# Patient Record
Sex: Female | Born: 1970
Health system: Southern US, Community
[De-identification: ages and names within clinical notes are randomized; demographics above are authoritative.]

## PROBLEM LIST (undated history)

## (undated) DIAGNOSIS — R0600 Dyspnea, unspecified: Secondary | ICD-10-CM

## (undated) DIAGNOSIS — M1990 Unspecified osteoarthritis, unspecified site: Secondary | ICD-10-CM

## (undated) DIAGNOSIS — K219 Gastro-esophageal reflux disease without esophagitis: Secondary | ICD-10-CM

## (undated) DIAGNOSIS — M792 Neuralgia and neuritis, unspecified: Secondary | ICD-10-CM

## (undated) DIAGNOSIS — F4310 Post-traumatic stress disorder, unspecified: Secondary | ICD-10-CM

## (undated) DIAGNOSIS — M4807 Spinal stenosis, lumbosacral region: Secondary | ICD-10-CM

## (undated) DIAGNOSIS — M87052 Idiopathic aseptic necrosis of left femur: Secondary | ICD-10-CM

## (undated) DIAGNOSIS — G43909 Migraine, unspecified, not intractable, without status migrainosus: Secondary | ICD-10-CM

## (undated) DIAGNOSIS — K759 Inflammatory liver disease, unspecified: Secondary | ICD-10-CM

## (undated) DIAGNOSIS — T1490XA Injury, unspecified, initial encounter: Secondary | ICD-10-CM

## (undated) DIAGNOSIS — N951 Menopausal and female climacteric states: Secondary | ICD-10-CM

## (undated) DIAGNOSIS — F32A Depression, unspecified: Secondary | ICD-10-CM

## (undated) DIAGNOSIS — D649 Anemia, unspecified: Secondary | ICD-10-CM

## (undated) DIAGNOSIS — Z21 Asymptomatic human immunodeficiency virus [HIV] infection status: Secondary | ICD-10-CM

## (undated) DIAGNOSIS — G4700 Insomnia, unspecified: Secondary | ICD-10-CM

## (undated) DIAGNOSIS — Z87442 Personal history of urinary calculi: Secondary | ICD-10-CM

## (undated) DIAGNOSIS — M5136 Other intervertebral disc degeneration, lumbar region: Secondary | ICD-10-CM

## (undated) DIAGNOSIS — M779 Enthesopathy, unspecified: Secondary | ICD-10-CM

## (undated) DIAGNOSIS — M51369 Other intervertebral disc degeneration, lumbar region without mention of lumbar back pain or lower extremity pain: Secondary | ICD-10-CM

## (undated) DIAGNOSIS — F419 Anxiety disorder, unspecified: Secondary | ICD-10-CM

## (undated) DIAGNOSIS — M5126 Other intervertebral disc displacement, lumbar region: Secondary | ICD-10-CM

## (undated) DIAGNOSIS — B20 Human immunodeficiency virus [HIV] disease: Secondary | ICD-10-CM

## (undated) DIAGNOSIS — F329 Major depressive disorder, single episode, unspecified: Secondary | ICD-10-CM

## (undated) HISTORY — DX: Other intervertebral disc degeneration, lumbar region without mention of lumbar back pain or lower extremity pain: M51.369

## (undated) HISTORY — DX: Menopausal and female climacteric states: N95.1

## (undated) HISTORY — PX: TUBAL LIGATION: SHX77

## (undated) HISTORY — DX: Insomnia, unspecified: G47.00

## (undated) HISTORY — DX: Unspecified osteoarthritis, unspecified site: M19.90

## (undated) HISTORY — DX: Migraine, unspecified, not intractable, without status migrainosus: G43.909

## (undated) HISTORY — DX: Human immunodeficiency virus (HIV) disease: B20

## (undated) HISTORY — PX: ABDOMINAL HYSTERECTOMY: SHX81

## (undated) HISTORY — DX: Unspecified porphyria: E80.20

## (undated) HISTORY — DX: Other intervertebral disc displacement, lumbar region: M51.26

## (undated) HISTORY — DX: Idiopathic aseptic necrosis of left femur: M87.052

## (undated) HISTORY — DX: Injury, unspecified, initial encounter: T14.90XA

## (undated) HISTORY — DX: Major depressive disorder, single episode, unspecified: F32.9

## (undated) HISTORY — PX: ESOPHAGOGASTRODUODENOSCOPY: SHX1529

## (undated) HISTORY — DX: Enthesopathy, unspecified: M77.9

## (undated) HISTORY — PX: INCONTINENCE SURGERY: SHX676

## (undated) HISTORY — DX: Post-traumatic stress disorder, unspecified: F43.10

## (undated) HISTORY — DX: Anxiety disorder, unspecified: F41.9

## (undated) HISTORY — DX: Gastro-esophageal reflux disease without esophagitis: K21.9

## (undated) HISTORY — DX: Other intervertebral disc degeneration, lumbar region: M51.36

## (undated) HISTORY — DX: Spinal stenosis, lumbosacral region: M48.07

## (undated) HISTORY — DX: Depression, unspecified: F32.A

## (undated) HISTORY — DX: Asymptomatic human immunodeficiency virus (hiv) infection status: Z21

## (undated) HISTORY — DX: Neuralgia and neuritis, unspecified: M79.2

## (undated) HISTORY — PX: URETHRAL SLING: SHX2621

## (undated) HISTORY — PX: JOINT REPLACEMENT: SHX530

---

## 2001-07-02 ENCOUNTER — Emergency Department (HOSPITAL_COMMUNITY): Admission: EM | Admit: 2001-07-02 | Discharge: 2001-07-03 | Payer: Self-pay | Admitting: Emergency Medicine

## 2003-04-01 ENCOUNTER — Emergency Department (HOSPITAL_COMMUNITY): Admission: EM | Admit: 2003-04-01 | Discharge: 2003-04-01 | Payer: Self-pay

## 2013-02-21 HISTORY — PX: TOTAL HIP ARTHROPLASTY: SHX124

## 2015-07-01 DIAGNOSIS — F32A Depression, unspecified: Secondary | ICD-10-CM | POA: Insufficient documentation

## 2015-09-13 DIAGNOSIS — E800 Hereditary erythropoietic porphyria: Secondary | ICD-10-CM | POA: Insufficient documentation

## 2015-09-13 DIAGNOSIS — M87052 Idiopathic aseptic necrosis of left femur: Secondary | ICD-10-CM | POA: Insufficient documentation

## 2016-01-02 DIAGNOSIS — Z21 Asymptomatic human immunodeficiency virus [HIV] infection status: Secondary | ICD-10-CM | POA: Diagnosis not present

## 2016-01-02 DIAGNOSIS — B1920 Unspecified viral hepatitis C without hepatic coma: Secondary | ICD-10-CM | POA: Diagnosis not present

## 2016-01-02 DIAGNOSIS — M4806 Spinal stenosis, lumbar region: Secondary | ICD-10-CM | POA: Diagnosis not present

## 2016-01-02 DIAGNOSIS — M25551 Pain in right hip: Secondary | ICD-10-CM | POA: Diagnosis not present

## 2016-01-02 DIAGNOSIS — Z882 Allergy status to sulfonamides status: Secondary | ICD-10-CM | POA: Diagnosis not present

## 2016-01-02 DIAGNOSIS — Z96649 Presence of unspecified artificial hip joint: Secondary | ICD-10-CM | POA: Diagnosis not present

## 2016-01-02 DIAGNOSIS — G8929 Other chronic pain: Secondary | ICD-10-CM | POA: Diagnosis not present

## 2016-01-02 DIAGNOSIS — Z79899 Other long term (current) drug therapy: Secondary | ICD-10-CM | POA: Diagnosis not present

## 2016-01-02 DIAGNOSIS — M25552 Pain in left hip: Secondary | ICD-10-CM | POA: Diagnosis not present

## 2016-01-02 DIAGNOSIS — M5416 Radiculopathy, lumbar region: Secondary | ICD-10-CM | POA: Diagnosis not present

## 2016-01-04 DIAGNOSIS — R05 Cough: Secondary | ICD-10-CM | POA: Diagnosis not present

## 2016-01-04 DIAGNOSIS — E669 Obesity, unspecified: Secondary | ICD-10-CM | POA: Diagnosis not present

## 2016-01-04 DIAGNOSIS — Z23 Encounter for immunization: Secondary | ICD-10-CM | POA: Diagnosis not present

## 2016-01-04 DIAGNOSIS — Z21 Asymptomatic human immunodeficiency virus [HIV] infection status: Secondary | ICD-10-CM | POA: Diagnosis not present

## 2016-01-04 DIAGNOSIS — Z7189 Other specified counseling: Secondary | ICD-10-CM | POA: Diagnosis not present

## 2016-01-05 DIAGNOSIS — M25551 Pain in right hip: Secondary | ICD-10-CM | POA: Diagnosis not present

## 2016-01-05 DIAGNOSIS — M545 Low back pain: Secondary | ICD-10-CM | POA: Diagnosis not present

## 2016-01-05 DIAGNOSIS — M5416 Radiculopathy, lumbar region: Secondary | ICD-10-CM | POA: Diagnosis not present

## 2016-01-11 DIAGNOSIS — B20 Human immunodeficiency virus [HIV] disease: Secondary | ICD-10-CM | POA: Diagnosis not present

## 2016-01-11 DIAGNOSIS — M5416 Radiculopathy, lumbar region: Secondary | ICD-10-CM | POA: Diagnosis not present

## 2016-01-11 DIAGNOSIS — M25551 Pain in right hip: Secondary | ICD-10-CM | POA: Diagnosis not present

## 2016-01-11 DIAGNOSIS — M545 Low back pain: Secondary | ICD-10-CM | POA: Diagnosis not present

## 2016-01-13 DIAGNOSIS — S2190XA Unspecified open wound of unspecified part of thorax, initial encounter: Secondary | ICD-10-CM | POA: Diagnosis not present

## 2016-01-13 DIAGNOSIS — R109 Unspecified abdominal pain: Secondary | ICD-10-CM | POA: Diagnosis not present

## 2016-01-13 DIAGNOSIS — Y33XXXA Other specified events, undetermined intent, initial encounter: Secondary | ICD-10-CM | POA: Diagnosis not present

## 2016-01-13 DIAGNOSIS — S301XXA Contusion of abdominal wall, initial encounter: Secondary | ICD-10-CM | POA: Diagnosis not present

## 2016-01-13 DIAGNOSIS — S161XXA Strain of muscle, fascia and tendon at neck level, initial encounter: Secondary | ICD-10-CM | POA: Diagnosis not present

## 2016-01-13 DIAGNOSIS — B182 Chronic viral hepatitis C: Secondary | ICD-10-CM | POA: Diagnosis not present

## 2016-01-13 DIAGNOSIS — R51 Headache: Secondary | ICD-10-CM | POA: Diagnosis not present

## 2016-01-13 DIAGNOSIS — S20219A Contusion of unspecified front wall of thorax, initial encounter: Secondary | ICD-10-CM | POA: Diagnosis not present

## 2016-01-13 DIAGNOSIS — F329 Major depressive disorder, single episode, unspecified: Secondary | ICD-10-CM | POA: Diagnosis not present

## 2016-01-13 DIAGNOSIS — M542 Cervicalgia: Secondary | ICD-10-CM | POA: Diagnosis not present

## 2016-01-13 DIAGNOSIS — K7460 Unspecified cirrhosis of liver: Secondary | ICD-10-CM | POA: Diagnosis not present

## 2016-01-13 DIAGNOSIS — R079 Chest pain, unspecified: Secondary | ICD-10-CM | POA: Diagnosis not present

## 2016-01-13 DIAGNOSIS — Z79899 Other long term (current) drug therapy: Secondary | ICD-10-CM | POA: Diagnosis not present

## 2016-01-13 DIAGNOSIS — K219 Gastro-esophageal reflux disease without esophagitis: Secondary | ICD-10-CM | POA: Diagnosis not present

## 2016-01-13 DIAGNOSIS — F419 Anxiety disorder, unspecified: Secondary | ICD-10-CM | POA: Diagnosis not present

## 2016-01-17 DIAGNOSIS — Z1231 Encounter for screening mammogram for malignant neoplasm of breast: Secondary | ICD-10-CM | POA: Diagnosis not present

## 2016-01-18 DIAGNOSIS — F331 Major depressive disorder, recurrent, moderate: Secondary | ICD-10-CM | POA: Diagnosis not present

## 2016-01-18 DIAGNOSIS — F4310 Post-traumatic stress disorder, unspecified: Secondary | ICD-10-CM | POA: Diagnosis not present

## 2016-01-18 DIAGNOSIS — B20 Human immunodeficiency virus [HIV] disease: Secondary | ICD-10-CM | POA: Diagnosis not present

## 2016-01-18 DIAGNOSIS — E663 Overweight: Secondary | ICD-10-CM | POA: Diagnosis not present

## 2016-01-19 DIAGNOSIS — M25551 Pain in right hip: Secondary | ICD-10-CM | POA: Diagnosis not present

## 2016-01-19 DIAGNOSIS — M545 Low back pain: Secondary | ICD-10-CM | POA: Diagnosis not present

## 2016-01-19 DIAGNOSIS — M5416 Radiculopathy, lumbar region: Secondary | ICD-10-CM | POA: Diagnosis not present

## 2016-01-23 DIAGNOSIS — M5416 Radiculopathy, lumbar region: Secondary | ICD-10-CM | POA: Diagnosis not present

## 2016-01-23 DIAGNOSIS — M545 Low back pain: Secondary | ICD-10-CM | POA: Diagnosis not present

## 2016-01-23 DIAGNOSIS — M25551 Pain in right hip: Secondary | ICD-10-CM | POA: Diagnosis not present

## 2016-01-27 DIAGNOSIS — F3340 Major depressive disorder, recurrent, in remission, unspecified: Secondary | ICD-10-CM | POA: Diagnosis not present

## 2016-01-27 DIAGNOSIS — B1920 Unspecified viral hepatitis C without hepatic coma: Secondary | ICD-10-CM | POA: Diagnosis not present

## 2016-01-27 DIAGNOSIS — B20 Human immunodeficiency virus [HIV] disease: Secondary | ICD-10-CM | POA: Diagnosis not present

## 2016-02-06 DIAGNOSIS — M5416 Radiculopathy, lumbar region: Secondary | ICD-10-CM | POA: Diagnosis not present

## 2016-02-06 DIAGNOSIS — Z21 Asymptomatic human immunodeficiency virus [HIV] infection status: Secondary | ICD-10-CM | POA: Diagnosis not present

## 2016-02-06 DIAGNOSIS — M25551 Pain in right hip: Secondary | ICD-10-CM | POA: Diagnosis not present

## 2016-02-06 DIAGNOSIS — B1920 Unspecified viral hepatitis C without hepatic coma: Secondary | ICD-10-CM | POA: Diagnosis not present

## 2016-02-06 DIAGNOSIS — M4806 Spinal stenosis, lumbar region: Secondary | ICD-10-CM | POA: Diagnosis not present

## 2016-02-06 DIAGNOSIS — G8929 Other chronic pain: Secondary | ICD-10-CM | POA: Diagnosis not present

## 2016-02-16 DIAGNOSIS — B1920 Unspecified viral hepatitis C without hepatic coma: Secondary | ICD-10-CM | POA: Diagnosis not present

## 2016-02-16 DIAGNOSIS — K760 Fatty (change of) liver, not elsewhere classified: Secondary | ICD-10-CM | POA: Diagnosis not present

## 2016-02-17 DIAGNOSIS — G43809 Other migraine, not intractable, without status migrainosus: Secondary | ICD-10-CM | POA: Diagnosis not present

## 2016-02-17 DIAGNOSIS — B1920 Unspecified viral hepatitis C without hepatic coma: Secondary | ICD-10-CM | POA: Diagnosis not present

## 2016-02-17 DIAGNOSIS — B20 Human immunodeficiency virus [HIV] disease: Secondary | ICD-10-CM | POA: Diagnosis not present

## 2016-02-24 DIAGNOSIS — R339 Retention of urine, unspecified: Secondary | ICD-10-CM | POA: Diagnosis not present

## 2016-02-24 DIAGNOSIS — Z21 Asymptomatic human immunodeficiency virus [HIV] infection status: Secondary | ICD-10-CM | POA: Diagnosis not present

## 2016-02-29 DIAGNOSIS — B1920 Unspecified viral hepatitis C without hepatic coma: Secondary | ICD-10-CM | POA: Diagnosis not present

## 2016-03-02 DIAGNOSIS — Z21 Asymptomatic human immunodeficiency virus [HIV] infection status: Secondary | ICD-10-CM | POA: Diagnosis not present

## 2016-03-02 DIAGNOSIS — R635 Abnormal weight gain: Secondary | ICD-10-CM | POA: Diagnosis not present

## 2016-03-02 DIAGNOSIS — Z6833 Body mass index (BMI) 33.0-33.9, adult: Secondary | ICD-10-CM | POA: Diagnosis not present

## 2016-03-02 DIAGNOSIS — J069 Acute upper respiratory infection, unspecified: Secondary | ICD-10-CM | POA: Diagnosis not present

## 2016-03-06 DIAGNOSIS — B20 Human immunodeficiency virus [HIV] disease: Secondary | ICD-10-CM | POA: Diagnosis not present

## 2016-03-06 DIAGNOSIS — K209 Esophagitis, unspecified: Secondary | ICD-10-CM | POA: Diagnosis not present

## 2016-03-06 DIAGNOSIS — B182 Chronic viral hepatitis C: Secondary | ICD-10-CM | POA: Diagnosis not present

## 2016-03-14 DIAGNOSIS — F4310 Post-traumatic stress disorder, unspecified: Secondary | ICD-10-CM | POA: Diagnosis not present

## 2016-03-14 DIAGNOSIS — E663 Overweight: Secondary | ICD-10-CM | POA: Diagnosis not present

## 2016-03-14 DIAGNOSIS — F331 Major depressive disorder, recurrent, moderate: Secondary | ICD-10-CM | POA: Diagnosis not present

## 2016-03-14 DIAGNOSIS — B20 Human immunodeficiency virus [HIV] disease: Secondary | ICD-10-CM | POA: Diagnosis not present

## 2016-03-19 DIAGNOSIS — Z21 Asymptomatic human immunodeficiency virus [HIV] infection status: Secondary | ICD-10-CM | POA: Diagnosis not present

## 2016-03-19 DIAGNOSIS — M4806 Spinal stenosis, lumbar region: Secondary | ICD-10-CM | POA: Diagnosis not present

## 2016-03-19 DIAGNOSIS — M4726 Other spondylosis with radiculopathy, lumbar region: Secondary | ICD-10-CM | POA: Diagnosis not present

## 2016-03-19 DIAGNOSIS — Z79899 Other long term (current) drug therapy: Secondary | ICD-10-CM | POA: Diagnosis not present

## 2016-03-19 DIAGNOSIS — M25551 Pain in right hip: Secondary | ICD-10-CM | POA: Diagnosis not present

## 2016-03-19 DIAGNOSIS — G8929 Other chronic pain: Secondary | ICD-10-CM | POA: Diagnosis not present

## 2016-03-19 DIAGNOSIS — Z96649 Presence of unspecified artificial hip joint: Secondary | ICD-10-CM | POA: Diagnosis not present

## 2016-03-19 DIAGNOSIS — B1920 Unspecified viral hepatitis C without hepatic coma: Secondary | ICD-10-CM | POA: Diagnosis not present

## 2016-04-03 DIAGNOSIS — R350 Frequency of micturition: Secondary | ICD-10-CM | POA: Diagnosis not present

## 2016-04-03 DIAGNOSIS — N393 Stress incontinence (female) (male): Secondary | ICD-10-CM | POA: Diagnosis not present

## 2016-04-05 DIAGNOSIS — Z23 Encounter for immunization: Secondary | ICD-10-CM | POA: Diagnosis not present

## 2016-05-02 DIAGNOSIS — B9689 Other specified bacterial agents as the cause of diseases classified elsewhere: Secondary | ICD-10-CM | POA: Diagnosis not present

## 2016-05-02 DIAGNOSIS — J208 Acute bronchitis due to other specified organisms: Secondary | ICD-10-CM | POA: Diagnosis not present

## 2016-05-02 DIAGNOSIS — D509 Iron deficiency anemia, unspecified: Secondary | ICD-10-CM | POA: Diagnosis not present

## 2016-05-02 DIAGNOSIS — E669 Obesity, unspecified: Secondary | ICD-10-CM | POA: Diagnosis not present

## 2016-05-02 DIAGNOSIS — Z21 Asymptomatic human immunodeficiency virus [HIV] infection status: Secondary | ICD-10-CM | POA: Diagnosis not present

## 2016-05-03 DIAGNOSIS — Z23 Encounter for immunization: Secondary | ICD-10-CM | POA: Diagnosis not present

## 2016-05-15 DIAGNOSIS — N393 Stress incontinence (female) (male): Secondary | ICD-10-CM | POA: Diagnosis not present

## 2016-05-15 DIAGNOSIS — R350 Frequency of micturition: Secondary | ICD-10-CM | POA: Diagnosis not present

## 2016-05-17 DIAGNOSIS — B182 Chronic viral hepatitis C: Secondary | ICD-10-CM | POA: Diagnosis not present

## 2016-05-17 DIAGNOSIS — B20 Human immunodeficiency virus [HIV] disease: Secondary | ICD-10-CM | POA: Diagnosis not present

## 2016-05-22 DIAGNOSIS — N393 Stress incontinence (female) (male): Secondary | ICD-10-CM | POA: Diagnosis not present

## 2016-05-22 DIAGNOSIS — B182 Chronic viral hepatitis C: Secondary | ICD-10-CM | POA: Diagnosis not present

## 2016-05-22 DIAGNOSIS — R350 Frequency of micturition: Secondary | ICD-10-CM | POA: Diagnosis not present

## 2016-05-22 DIAGNOSIS — Z21 Asymptomatic human immunodeficiency virus [HIV] infection status: Secondary | ICD-10-CM | POA: Diagnosis not present

## 2016-05-23 DIAGNOSIS — F331 Major depressive disorder, recurrent, moderate: Secondary | ICD-10-CM | POA: Diagnosis not present

## 2016-05-23 DIAGNOSIS — B20 Human immunodeficiency virus [HIV] disease: Secondary | ICD-10-CM | POA: Diagnosis not present

## 2016-05-23 DIAGNOSIS — F4310 Post-traumatic stress disorder, unspecified: Secondary | ICD-10-CM | POA: Diagnosis not present

## 2016-05-23 DIAGNOSIS — B1920 Unspecified viral hepatitis C without hepatic coma: Secondary | ICD-10-CM | POA: Diagnosis not present

## 2016-05-29 DIAGNOSIS — Z21 Asymptomatic human immunodeficiency virus [HIV] infection status: Secondary | ICD-10-CM | POA: Diagnosis not present

## 2016-05-29 DIAGNOSIS — R05 Cough: Secondary | ICD-10-CM | POA: Diagnosis not present

## 2016-05-29 DIAGNOSIS — Z1159 Encounter for screening for other viral diseases: Secondary | ICD-10-CM | POA: Diagnosis not present

## 2016-05-29 DIAGNOSIS — Z7189 Other specified counseling: Secondary | ICD-10-CM | POA: Diagnosis not present

## 2016-05-31 DIAGNOSIS — Z21 Asymptomatic human immunodeficiency virus [HIV] infection status: Secondary | ICD-10-CM | POA: Diagnosis not present

## 2016-06-11 DIAGNOSIS — B1920 Unspecified viral hepatitis C without hepatic coma: Secondary | ICD-10-CM | POA: Diagnosis not present

## 2016-06-11 DIAGNOSIS — Z21 Asymptomatic human immunodeficiency virus [HIV] infection status: Secondary | ICD-10-CM | POA: Diagnosis not present

## 2016-06-11 DIAGNOSIS — K739 Chronic hepatitis, unspecified: Secondary | ICD-10-CM | POA: Diagnosis not present

## 2016-06-11 DIAGNOSIS — M4806 Spinal stenosis, lumbar region: Secondary | ICD-10-CM | POA: Diagnosis not present

## 2016-06-11 DIAGNOSIS — Z79899 Other long term (current) drug therapy: Secondary | ICD-10-CM | POA: Diagnosis not present

## 2016-06-11 DIAGNOSIS — M25551 Pain in right hip: Secondary | ICD-10-CM | POA: Diagnosis not present

## 2016-06-11 DIAGNOSIS — B20 Human immunodeficiency virus [HIV] disease: Secondary | ICD-10-CM | POA: Diagnosis not present

## 2016-06-11 DIAGNOSIS — G8929 Other chronic pain: Secondary | ICD-10-CM | POA: Diagnosis not present

## 2016-06-11 DIAGNOSIS — M5416 Radiculopathy, lumbar region: Secondary | ICD-10-CM | POA: Diagnosis not present

## 2016-06-11 DIAGNOSIS — M47816 Spondylosis without myelopathy or radiculopathy, lumbar region: Secondary | ICD-10-CM | POA: Diagnosis not present

## 2016-06-21 DIAGNOSIS — G4700 Insomnia, unspecified: Secondary | ICD-10-CM | POA: Diagnosis not present

## 2016-06-21 DIAGNOSIS — E669 Obesity, unspecified: Secondary | ICD-10-CM | POA: Diagnosis not present

## 2016-06-21 DIAGNOSIS — G4733 Obstructive sleep apnea (adult) (pediatric): Secondary | ICD-10-CM | POA: Diagnosis not present

## 2016-06-21 DIAGNOSIS — R05 Cough: Secondary | ICD-10-CM | POA: Diagnosis not present

## 2016-06-21 DIAGNOSIS — J309 Allergic rhinitis, unspecified: Secondary | ICD-10-CM | POA: Diagnosis not present

## 2016-06-22 DIAGNOSIS — N393 Stress incontinence (female) (male): Secondary | ICD-10-CM | POA: Diagnosis not present

## 2016-06-22 DIAGNOSIS — N8110 Cystocele, unspecified: Secondary | ICD-10-CM | POA: Diagnosis not present

## 2016-06-22 DIAGNOSIS — M1990 Unspecified osteoarthritis, unspecified site: Secondary | ICD-10-CM | POA: Diagnosis not present

## 2016-06-22 DIAGNOSIS — D759 Disease of blood and blood-forming organs, unspecified: Secondary | ICD-10-CM | POA: Diagnosis not present

## 2016-06-22 DIAGNOSIS — Z6834 Body mass index (BMI) 34.0-34.9, adult: Secondary | ICD-10-CM | POA: Diagnosis not present

## 2016-06-22 DIAGNOSIS — E669 Obesity, unspecified: Secondary | ICD-10-CM | POA: Diagnosis not present

## 2016-06-22 DIAGNOSIS — K219 Gastro-esophageal reflux disease without esophagitis: Secondary | ICD-10-CM | POA: Diagnosis not present

## 2016-06-22 DIAGNOSIS — Z01812 Encounter for preprocedural laboratory examination: Secondary | ICD-10-CM | POA: Diagnosis not present

## 2016-06-22 DIAGNOSIS — B1920 Unspecified viral hepatitis C without hepatic coma: Secondary | ICD-10-CM | POA: Diagnosis not present

## 2016-06-28 DIAGNOSIS — M545 Low back pain: Secondary | ICD-10-CM | POA: Diagnosis not present

## 2016-07-18 DIAGNOSIS — G4733 Obstructive sleep apnea (adult) (pediatric): Secondary | ICD-10-CM | POA: Diagnosis not present

## 2016-07-18 DIAGNOSIS — G4761 Periodic limb movement disorder: Secondary | ICD-10-CM | POA: Diagnosis not present

## 2016-07-24 DIAGNOSIS — B20 Human immunodeficiency virus [HIV] disease: Secondary | ICD-10-CM | POA: Diagnosis not present

## 2016-07-24 DIAGNOSIS — B182 Chronic viral hepatitis C: Secondary | ICD-10-CM | POA: Diagnosis not present

## 2016-07-25 DIAGNOSIS — K219 Gastro-esophageal reflux disease without esophagitis: Secondary | ICD-10-CM | POA: Diagnosis not present

## 2016-07-25 DIAGNOSIS — K7460 Unspecified cirrhosis of liver: Secondary | ICD-10-CM | POA: Diagnosis not present

## 2016-07-25 DIAGNOSIS — D649 Anemia, unspecified: Secondary | ICD-10-CM | POA: Diagnosis not present

## 2016-07-25 DIAGNOSIS — Z6833 Body mass index (BMI) 33.0-33.9, adult: Secondary | ICD-10-CM | POA: Diagnosis not present

## 2016-07-25 DIAGNOSIS — B20 Human immunodeficiency virus [HIV] disease: Secondary | ICD-10-CM | POA: Diagnosis not present

## 2016-07-25 DIAGNOSIS — E800 Hereditary erythropoietic porphyria: Secondary | ICD-10-CM | POA: Diagnosis not present

## 2016-07-25 DIAGNOSIS — N8112 Cystocele, lateral: Secondary | ICD-10-CM | POA: Insufficient documentation

## 2016-07-25 DIAGNOSIS — Z886 Allergy status to analgesic agent status: Secondary | ICD-10-CM | POA: Diagnosis not present

## 2016-07-25 DIAGNOSIS — N393 Stress incontinence (female) (male): Secondary | ICD-10-CM | POA: Diagnosis not present

## 2016-07-25 DIAGNOSIS — Z882 Allergy status to sulfonamides status: Secondary | ICD-10-CM | POA: Diagnosis not present

## 2016-07-25 DIAGNOSIS — Z79899 Other long term (current) drug therapy: Secondary | ICD-10-CM | POA: Diagnosis not present

## 2016-07-25 DIAGNOSIS — Z888 Allergy status to other drugs, medicaments and biological substances status: Secondary | ICD-10-CM | POA: Diagnosis not present

## 2016-07-25 DIAGNOSIS — N8110 Cystocele, unspecified: Secondary | ICD-10-CM | POA: Diagnosis not present

## 2016-07-25 DIAGNOSIS — Z79810 Long term (current) use of selective estrogen receptor modulators (SERMs): Secondary | ICD-10-CM | POA: Diagnosis not present

## 2016-07-25 DIAGNOSIS — B182 Chronic viral hepatitis C: Secondary | ICD-10-CM | POA: Diagnosis not present

## 2016-07-25 DIAGNOSIS — E669 Obesity, unspecified: Secondary | ICD-10-CM | POA: Diagnosis not present

## 2016-08-08 DIAGNOSIS — Z9889 Other specified postprocedural states: Secondary | ICD-10-CM | POA: Diagnosis not present

## 2016-09-10 DIAGNOSIS — B1920 Unspecified viral hepatitis C without hepatic coma: Secondary | ICD-10-CM | POA: Diagnosis not present

## 2016-09-10 DIAGNOSIS — M25552 Pain in left hip: Secondary | ICD-10-CM | POA: Diagnosis not present

## 2016-09-10 DIAGNOSIS — M4726 Other spondylosis with radiculopathy, lumbar region: Secondary | ICD-10-CM | POA: Diagnosis not present

## 2016-09-10 DIAGNOSIS — M47816 Spondylosis without myelopathy or radiculopathy, lumbar region: Secondary | ICD-10-CM | POA: Diagnosis not present

## 2016-09-10 DIAGNOSIS — M4806 Spinal stenosis, lumbar region: Secondary | ICD-10-CM | POA: Diagnosis not present

## 2016-09-10 DIAGNOSIS — G8929 Other chronic pain: Secondary | ICD-10-CM | POA: Diagnosis not present

## 2016-09-10 DIAGNOSIS — Z79899 Other long term (current) drug therapy: Secondary | ICD-10-CM | POA: Diagnosis not present

## 2016-09-10 DIAGNOSIS — Z21 Asymptomatic human immunodeficiency virus [HIV] infection status: Secondary | ICD-10-CM | POA: Diagnosis not present

## 2016-09-10 DIAGNOSIS — M25551 Pain in right hip: Secondary | ICD-10-CM | POA: Diagnosis not present

## 2016-09-10 DIAGNOSIS — M5416 Radiculopathy, lumbar region: Secondary | ICD-10-CM | POA: Diagnosis not present

## 2016-09-25 DIAGNOSIS — R51 Headache: Secondary | ICD-10-CM | POA: Diagnosis not present

## 2016-09-26 DIAGNOSIS — B1920 Unspecified viral hepatitis C without hepatic coma: Secondary | ICD-10-CM | POA: Diagnosis not present

## 2016-09-26 DIAGNOSIS — B20 Human immunodeficiency virus [HIV] disease: Secondary | ICD-10-CM | POA: Diagnosis not present

## 2016-09-26 DIAGNOSIS — F4310 Post-traumatic stress disorder, unspecified: Secondary | ICD-10-CM | POA: Diagnosis not present

## 2016-09-26 DIAGNOSIS — F331 Major depressive disorder, recurrent, moderate: Secondary | ICD-10-CM | POA: Diagnosis not present

## 2016-10-01 DIAGNOSIS — M48061 Spinal stenosis, lumbar region without neurogenic claudication: Secondary | ICD-10-CM | POA: Diagnosis not present

## 2016-10-01 DIAGNOSIS — Z21 Asymptomatic human immunodeficiency virus [HIV] infection status: Secondary | ICD-10-CM | POA: Diagnosis not present

## 2016-10-01 DIAGNOSIS — Z79899 Other long term (current) drug therapy: Secondary | ICD-10-CM | POA: Diagnosis not present

## 2016-10-01 DIAGNOSIS — M25551 Pain in right hip: Secondary | ICD-10-CM | POA: Diagnosis not present

## 2016-10-01 DIAGNOSIS — M25552 Pain in left hip: Secondary | ICD-10-CM | POA: Diagnosis not present

## 2016-10-01 DIAGNOSIS — M545 Low back pain: Secondary | ICD-10-CM | POA: Diagnosis not present

## 2016-10-01 DIAGNOSIS — M5416 Radiculopathy, lumbar region: Secondary | ICD-10-CM | POA: Diagnosis not present

## 2016-10-01 DIAGNOSIS — Z96641 Presence of right artificial hip joint: Secondary | ICD-10-CM | POA: Diagnosis not present

## 2016-10-01 DIAGNOSIS — M47816 Spondylosis without myelopathy or radiculopathy, lumbar region: Secondary | ICD-10-CM | POA: Diagnosis not present

## 2016-10-01 DIAGNOSIS — B1920 Unspecified viral hepatitis C without hepatic coma: Secondary | ICD-10-CM | POA: Diagnosis not present

## 2016-10-01 DIAGNOSIS — G8929 Other chronic pain: Secondary | ICD-10-CM | POA: Diagnosis not present

## 2016-10-01 DIAGNOSIS — M4726 Other spondylosis with radiculopathy, lumbar region: Secondary | ICD-10-CM | POA: Diagnosis not present

## 2016-10-11 DIAGNOSIS — K7460 Unspecified cirrhosis of liver: Secondary | ICD-10-CM | POA: Diagnosis not present

## 2016-10-11 DIAGNOSIS — D649 Anemia, unspecified: Secondary | ICD-10-CM | POA: Diagnosis not present

## 2016-10-11 DIAGNOSIS — F329 Major depressive disorder, single episode, unspecified: Secondary | ICD-10-CM | POA: Diagnosis not present

## 2016-10-11 DIAGNOSIS — M810 Age-related osteoporosis without current pathological fracture: Secondary | ICD-10-CM | POA: Diagnosis not present

## 2016-10-11 DIAGNOSIS — M25552 Pain in left hip: Secondary | ICD-10-CM | POA: Diagnosis not present

## 2016-10-11 DIAGNOSIS — M25551 Pain in right hip: Secondary | ICD-10-CM | POA: Diagnosis not present

## 2016-10-11 DIAGNOSIS — M545 Low back pain: Secondary | ICD-10-CM | POA: Diagnosis not present

## 2016-10-11 DIAGNOSIS — M1990 Unspecified osteoarthritis, unspecified site: Secondary | ICD-10-CM | POA: Diagnosis not present

## 2016-10-11 DIAGNOSIS — G8929 Other chronic pain: Secondary | ICD-10-CM | POA: Diagnosis not present

## 2016-10-11 DIAGNOSIS — Z9181 History of falling: Secondary | ICD-10-CM | POA: Diagnosis not present

## 2016-10-11 DIAGNOSIS — Z21 Asymptomatic human immunodeficiency virus [HIV] infection status: Secondary | ICD-10-CM | POA: Diagnosis not present

## 2016-10-15 DIAGNOSIS — M545 Low back pain: Secondary | ICD-10-CM | POA: Diagnosis not present

## 2016-10-15 DIAGNOSIS — M25551 Pain in right hip: Secondary | ICD-10-CM | POA: Diagnosis not present

## 2016-10-15 DIAGNOSIS — Z9181 History of falling: Secondary | ICD-10-CM | POA: Diagnosis not present

## 2016-10-15 DIAGNOSIS — D649 Anemia, unspecified: Secondary | ICD-10-CM | POA: Diagnosis not present

## 2016-10-15 DIAGNOSIS — M25552 Pain in left hip: Secondary | ICD-10-CM | POA: Diagnosis not present

## 2016-10-15 DIAGNOSIS — G8929 Other chronic pain: Secondary | ICD-10-CM | POA: Diagnosis not present

## 2016-10-23 DIAGNOSIS — Z0000 Encounter for general adult medical examination without abnormal findings: Secondary | ICD-10-CM | POA: Diagnosis not present

## 2016-10-24 DIAGNOSIS — Z23 Encounter for immunization: Secondary | ICD-10-CM | POA: Diagnosis not present

## 2016-11-20 DIAGNOSIS — B20 Human immunodeficiency virus [HIV] disease: Secondary | ICD-10-CM | POA: Diagnosis not present

## 2016-11-20 DIAGNOSIS — B182 Chronic viral hepatitis C: Secondary | ICD-10-CM | POA: Diagnosis not present

## 2016-11-21 DIAGNOSIS — F4310 Post-traumatic stress disorder, unspecified: Secondary | ICD-10-CM | POA: Diagnosis not present

## 2016-11-21 DIAGNOSIS — B20 Human immunodeficiency virus [HIV] disease: Secondary | ICD-10-CM | POA: Diagnosis not present

## 2016-11-21 DIAGNOSIS — F331 Major depressive disorder, recurrent, moderate: Secondary | ICD-10-CM | POA: Diagnosis not present

## 2016-11-21 DIAGNOSIS — B1920 Unspecified viral hepatitis C without hepatic coma: Secondary | ICD-10-CM | POA: Diagnosis not present

## 2016-11-27 DIAGNOSIS — K7460 Unspecified cirrhosis of liver: Secondary | ICD-10-CM | POA: Diagnosis not present

## 2016-11-27 DIAGNOSIS — B20 Human immunodeficiency virus [HIV] disease: Secondary | ICD-10-CM | POA: Diagnosis not present

## 2016-11-27 DIAGNOSIS — G43A0 Cyclical vomiting, not intractable: Secondary | ICD-10-CM | POA: Diagnosis not present

## 2016-11-27 DIAGNOSIS — B182 Chronic viral hepatitis C: Secondary | ICD-10-CM | POA: Diagnosis not present

## 2016-12-05 DIAGNOSIS — R300 Dysuria: Secondary | ICD-10-CM | POA: Diagnosis not present

## 2016-12-05 DIAGNOSIS — N952 Postmenopausal atrophic vaginitis: Secondary | ICD-10-CM | POA: Diagnosis not present

## 2016-12-05 DIAGNOSIS — R350 Frequency of micturition: Secondary | ICD-10-CM | POA: Diagnosis not present

## 2017-01-23 DIAGNOSIS — K209 Esophagitis, unspecified: Secondary | ICD-10-CM | POA: Diagnosis not present

## 2017-01-23 DIAGNOSIS — B20 Human immunodeficiency virus [HIV] disease: Secondary | ICD-10-CM | POA: Diagnosis not present

## 2017-01-23 DIAGNOSIS — G43A0 Cyclical vomiting, not intractable: Secondary | ICD-10-CM | POA: Diagnosis not present

## 2017-01-23 DIAGNOSIS — B182 Chronic viral hepatitis C: Secondary | ICD-10-CM | POA: Diagnosis not present

## 2017-01-28 DIAGNOSIS — Z96641 Presence of right artificial hip joint: Secondary | ICD-10-CM | POA: Diagnosis not present

## 2017-01-28 DIAGNOSIS — M1612 Unilateral primary osteoarthritis, left hip: Secondary | ICD-10-CM | POA: Diagnosis not present

## 2017-01-28 DIAGNOSIS — F331 Major depressive disorder, recurrent, moderate: Secondary | ICD-10-CM | POA: Diagnosis not present

## 2017-01-28 DIAGNOSIS — M545 Low back pain: Secondary | ICD-10-CM | POA: Diagnosis not present

## 2017-01-28 DIAGNOSIS — M25552 Pain in left hip: Secondary | ICD-10-CM | POA: Diagnosis not present

## 2017-01-28 DIAGNOSIS — Z79899 Other long term (current) drug therapy: Secondary | ICD-10-CM | POA: Diagnosis not present

## 2017-01-28 DIAGNOSIS — G8929 Other chronic pain: Secondary | ICD-10-CM | POA: Diagnosis not present

## 2017-01-28 DIAGNOSIS — E663 Overweight: Secondary | ICD-10-CM | POA: Diagnosis not present

## 2017-01-28 DIAGNOSIS — F4310 Post-traumatic stress disorder, unspecified: Secondary | ICD-10-CM | POA: Diagnosis not present

## 2017-02-11 DIAGNOSIS — M1612 Unilateral primary osteoarthritis, left hip: Secondary | ICD-10-CM | POA: Diagnosis not present

## 2017-02-11 DIAGNOSIS — M545 Low back pain: Secondary | ICD-10-CM | POA: Diagnosis not present

## 2017-02-11 DIAGNOSIS — Z79891 Long term (current) use of opiate analgesic: Secondary | ICD-10-CM | POA: Diagnosis not present

## 2017-02-11 DIAGNOSIS — G8929 Other chronic pain: Secondary | ICD-10-CM | POA: Diagnosis not present

## 2017-02-11 DIAGNOSIS — M87059 Idiopathic aseptic necrosis of unspecified femur: Secondary | ICD-10-CM | POA: Diagnosis not present

## 2017-02-11 DIAGNOSIS — Z96641 Presence of right artificial hip joint: Secondary | ICD-10-CM | POA: Diagnosis not present

## 2017-02-11 DIAGNOSIS — M25552 Pain in left hip: Secondary | ICD-10-CM | POA: Diagnosis not present

## 2017-02-13 DIAGNOSIS — R600 Localized edema: Secondary | ICD-10-CM | POA: Diagnosis not present

## 2017-02-13 DIAGNOSIS — M25552 Pain in left hip: Secondary | ICD-10-CM | POA: Diagnosis not present

## 2017-02-13 DIAGNOSIS — M1612 Unilateral primary osteoarthritis, left hip: Secondary | ICD-10-CM | POA: Diagnosis not present

## 2017-02-13 DIAGNOSIS — M879 Osteonecrosis, unspecified: Secondary | ICD-10-CM | POA: Diagnosis not present

## 2017-02-14 DIAGNOSIS — Z1231 Encounter for screening mammogram for malignant neoplasm of breast: Secondary | ICD-10-CM | POA: Diagnosis not present

## 2017-02-18 DIAGNOSIS — M879 Osteonecrosis, unspecified: Secondary | ICD-10-CM | POA: Diagnosis not present

## 2017-02-18 DIAGNOSIS — Z96641 Presence of right artificial hip joint: Secondary | ICD-10-CM | POA: Diagnosis not present

## 2017-02-18 DIAGNOSIS — M1612 Unilateral primary osteoarthritis, left hip: Secondary | ICD-10-CM | POA: Diagnosis not present

## 2017-02-18 DIAGNOSIS — M87052 Idiopathic aseptic necrosis of left femur: Secondary | ICD-10-CM | POA: Diagnosis not present

## 2017-02-18 DIAGNOSIS — M25552 Pain in left hip: Secondary | ICD-10-CM | POA: Diagnosis not present

## 2017-02-18 DIAGNOSIS — Z79899 Other long term (current) drug therapy: Secondary | ICD-10-CM | POA: Diagnosis not present

## 2017-03-08 DIAGNOSIS — Z96641 Presence of right artificial hip joint: Secondary | ICD-10-CM | POA: Diagnosis not present

## 2017-03-08 DIAGNOSIS — M87052 Idiopathic aseptic necrosis of left femur: Secondary | ICD-10-CM | POA: Diagnosis not present

## 2017-03-26 DIAGNOSIS — F331 Major depressive disorder, recurrent, moderate: Secondary | ICD-10-CM | POA: Diagnosis not present

## 2017-03-26 DIAGNOSIS — E663 Overweight: Secondary | ICD-10-CM | POA: Diagnosis not present

## 2017-03-26 DIAGNOSIS — B20 Human immunodeficiency virus [HIV] disease: Secondary | ICD-10-CM | POA: Diagnosis not present

## 2017-03-26 DIAGNOSIS — F4310 Post-traumatic stress disorder, unspecified: Secondary | ICD-10-CM | POA: Diagnosis not present

## 2017-03-28 DIAGNOSIS — B182 Chronic viral hepatitis C: Secondary | ICD-10-CM | POA: Diagnosis not present

## 2017-03-28 DIAGNOSIS — K210 Gastro-esophageal reflux disease with esophagitis: Secondary | ICD-10-CM | POA: Diagnosis not present

## 2017-03-28 DIAGNOSIS — R1310 Dysphagia, unspecified: Secondary | ICD-10-CM | POA: Diagnosis not present

## 2017-04-03 DIAGNOSIS — R112 Nausea with vomiting, unspecified: Secondary | ICD-10-CM | POA: Diagnosis not present

## 2017-04-03 DIAGNOSIS — K7460 Unspecified cirrhosis of liver: Secondary | ICD-10-CM | POA: Diagnosis not present

## 2017-04-03 DIAGNOSIS — B182 Chronic viral hepatitis C: Secondary | ICD-10-CM | POA: Diagnosis not present

## 2017-04-03 DIAGNOSIS — B20 Human immunodeficiency virus [HIV] disease: Secondary | ICD-10-CM | POA: Diagnosis not present

## 2017-04-09 DIAGNOSIS — B1920 Unspecified viral hepatitis C without hepatic coma: Secondary | ICD-10-CM | POA: Diagnosis not present

## 2017-04-10 DIAGNOSIS — K3189 Other diseases of stomach and duodenum: Secondary | ICD-10-CM | POA: Diagnosis not present

## 2017-04-10 DIAGNOSIS — K2970 Gastritis, unspecified, without bleeding: Secondary | ICD-10-CM | POA: Diagnosis not present

## 2017-04-10 DIAGNOSIS — R1013 Epigastric pain: Secondary | ICD-10-CM | POA: Diagnosis not present

## 2017-05-13 DIAGNOSIS — B20 Human immunodeficiency virus [HIV] disease: Secondary | ICD-10-CM | POA: Diagnosis not present

## 2017-05-13 DIAGNOSIS — R112 Nausea with vomiting, unspecified: Secondary | ICD-10-CM | POA: Diagnosis not present

## 2017-05-27 DIAGNOSIS — F331 Major depressive disorder, recurrent, moderate: Secondary | ICD-10-CM | POA: Diagnosis not present

## 2017-05-27 DIAGNOSIS — E663 Overweight: Secondary | ICD-10-CM | POA: Diagnosis not present

## 2017-05-27 DIAGNOSIS — F4310 Post-traumatic stress disorder, unspecified: Secondary | ICD-10-CM | POA: Diagnosis not present

## 2017-05-27 DIAGNOSIS — B20 Human immunodeficiency virus [HIV] disease: Secondary | ICD-10-CM | POA: Diagnosis not present

## 2017-06-10 DIAGNOSIS — M1612 Unilateral primary osteoarthritis, left hip: Secondary | ICD-10-CM | POA: Diagnosis not present

## 2017-06-10 DIAGNOSIS — Z79899 Other long term (current) drug therapy: Secondary | ICD-10-CM | POA: Diagnosis not present

## 2017-06-10 DIAGNOSIS — G8929 Other chronic pain: Secondary | ICD-10-CM | POA: Diagnosis not present

## 2017-06-10 DIAGNOSIS — M545 Low back pain: Secondary | ICD-10-CM | POA: Diagnosis not present

## 2017-06-10 DIAGNOSIS — M25552 Pain in left hip: Secondary | ICD-10-CM | POA: Diagnosis not present

## 2017-06-10 DIAGNOSIS — M87052 Idiopathic aseptic necrosis of left femur: Secondary | ICD-10-CM | POA: Diagnosis not present

## 2017-06-10 DIAGNOSIS — Z96641 Presence of right artificial hip joint: Secondary | ICD-10-CM | POA: Diagnosis not present

## 2017-06-10 DIAGNOSIS — Z791 Long term (current) use of non-steroidal anti-inflammatories (NSAID): Secondary | ICD-10-CM | POA: Diagnosis not present

## 2017-06-10 DIAGNOSIS — M879 Osteonecrosis, unspecified: Secondary | ICD-10-CM | POA: Diagnosis not present

## 2017-06-25 DIAGNOSIS — B20 Human immunodeficiency virus [HIV] disease: Secondary | ICD-10-CM | POA: Diagnosis not present

## 2017-07-02 DIAGNOSIS — E663 Overweight: Secondary | ICD-10-CM | POA: Diagnosis not present

## 2017-07-02 DIAGNOSIS — F4310 Post-traumatic stress disorder, unspecified: Secondary | ICD-10-CM | POA: Diagnosis not present

## 2017-07-02 DIAGNOSIS — F331 Major depressive disorder, recurrent, moderate: Secondary | ICD-10-CM | POA: Diagnosis not present

## 2017-07-24 ENCOUNTER — Telehealth: Payer: Self-pay

## 2017-07-24 NOTE — Telephone Encounter (Signed)
Patient called our office stating she had a missed call from this number.  She is a new transfer HIV who has recently moved to [place] from [place].  She stated she has signed a release for medical records.  Per patient last labs were done in July, 2018.   Medical records have been received.  Left message for patient  She will need to come for financial assessment if she is uninsured.  Laurell Josephsammy K Samar Venneman, RN

## 2017-07-29 ENCOUNTER — Encounter: Payer: Self-pay | Admitting: Infectious Diseases

## 2017-07-29 ENCOUNTER — Ambulatory Visit (INDEPENDENT_AMBULATORY_CARE_PROVIDER_SITE_OTHER): Payer: Medicare Other | Admitting: Infectious Diseases

## 2017-07-29 VITALS — BP 112/79 | HR 86 | Temp 98.2°F | Wt 178.0 lb

## 2017-07-29 DIAGNOSIS — B373 Candidiasis of vulva and vagina: Secondary | ICD-10-CM | POA: Diagnosis present

## 2017-07-29 DIAGNOSIS — Z8619 Personal history of other infectious and parasitic diseases: Secondary | ICD-10-CM | POA: Diagnosis not present

## 2017-07-29 DIAGNOSIS — B3731 Acute candidiasis of vulva and vagina: Secondary | ICD-10-CM

## 2017-07-29 DIAGNOSIS — Z79899 Other long term (current) drug therapy: Secondary | ICD-10-CM | POA: Diagnosis not present

## 2017-07-29 DIAGNOSIS — B20 Human immunodeficiency virus [HIV] disease: Secondary | ICD-10-CM | POA: Diagnosis not present

## 2017-07-29 DIAGNOSIS — Z5181 Encounter for therapeutic drug level monitoring: Secondary | ICD-10-CM | POA: Diagnosis not present

## 2017-07-29 DIAGNOSIS — Z21 Asymptomatic human immunodeficiency virus [HIV] infection status: Secondary | ICD-10-CM

## 2017-07-29 DIAGNOSIS — K219 Gastro-esophageal reflux disease without esophagitis: Secondary | ICD-10-CM

## 2017-07-29 DIAGNOSIS — Z1272 Encounter for screening for malignant neoplasm of vagina: Secondary | ICD-10-CM | POA: Insufficient documentation

## 2017-07-29 DIAGNOSIS — Z0000 Encounter for general adult medical examination without abnormal findings: Secondary | ICD-10-CM | POA: Diagnosis not present

## 2017-07-29 HISTORY — DX: Human immunodeficiency virus (HIV) disease: B20

## 2017-07-29 HISTORY — DX: Asymptomatic human immunodeficiency virus (hiv) infection status: Z21

## 2017-07-29 LAB — CBC WITH DIFFERENTIAL/PLATELET
Basophils Absolute: 0 cells/uL (ref 0–200)
Basophils Relative: 0 %
Eosinophils Absolute: 60 cells/uL (ref 15–500)
Eosinophils Relative: 2 %
HCT: 40.3 % (ref 35.0–45.0)
Hemoglobin: 13.5 g/dL (ref 11.7–15.5)
Lymphocytes Relative: 46 %
Lymphs Abs: 1380 cells/uL (ref 850–3900)
MCH: 31.5 pg (ref 27.0–33.0)
MCHC: 33.5 g/dL (ref 32.0–36.0)
MCV: 93.9 fL (ref 80.0–100.0)
MPV: 10.2 fL (ref 7.5–12.5)
Monocytes Absolute: 240 cells/uL (ref 200–950)
Monocytes Relative: 8 %
Neutro Abs: 1320 cells/uL — ABNORMAL LOW (ref 1500–7800)
Neutrophils Relative %: 44 %
Platelets: 183 10*3/uL (ref 140–400)
RBC: 4.29 MIL/uL (ref 3.80–5.10)
RDW: 16.6 % — ABNORMAL HIGH (ref 11.0–15.0)
WBC: 3 10*3/uL — ABNORMAL LOW (ref 3.8–10.8)

## 2017-07-29 NOTE — Progress Notes (Signed)
PCP - Patient, No Pcp Per (establishing care soon per patient)  Patient Active Problem List   Diagnosis Date Noted  . Hx of hepatitis C 07/30/2017  . Acid reflux 07/30/2017  . HIV (human immunodeficiency virus infection) ([place]) 07/29/2017  . Healthcare maintenance 07/29/2017    Subjective: Laura Jones presents to clinic today to transfer care for her HIV infection. Moved to [place] from [place] where she was in care at [facility]. She has lived with HIV for a long time and has multiple other health problems including hepatitis C, of which she has been treated for. Her sister accompanies her today for the visit.   HPI:  HIV = Currently maintained on Genvoya and tolerating this regimen well. Earlier this year in February she had a difficult time keeping medications down d/t vomiting and VL up to 19,000 copies in April. CD4 288 prior to stopping Genvoya in Feb. Genotype showed only NNRTI resistance. After work up this was found to be a side effect of Mobic use and since discontinuation she has had no further vomiting and is maintained on Dexilant + Zantac for severe GERD. Latest VL in July undetectable at 31 copies and CD4 218. She does not miss doses of her medications.   Chronic Pain r/t AVN of hip = reports this is managed by a pain specialist and currently awaiting plan for hip replacement once she achieves suppression.   Health Maintenance = [name] reports she is s/p total hysterectomy and has not had pap smears in several years. She does tell me she has previously had several interventions for abnormal pap smears including colposcopy/biopsy. Not currently smoking and no alcohol use.   Review of Systems: Review of Systems  Constitutional: Negative for chills, fever, malaise/fatigue and weight loss.  HENT: Negative for sore throat.        No dental problems  Respiratory: Negative for cough and sputum production.   Cardiovascular: Negative for chest  pain and leg swelling.  Gastrointestinal: Negative for abdominal pain, diarrhea and vomiting.  Genitourinary: Negative for dysuria and flank pain.  Musculoskeletal: Positive for joint pain. Negative for myalgias and neck pain (hip).  Skin: Negative for rash.  Neurological: Negative for dizziness, tingling and headaches.  Psychiatric/Behavioral: Negative for depression. The patient is not nervous/anxious and does not have insomnia.     Past Medical History:  Diagnosis Date  . Anxiety   . Arthritis    left knee  . Avascular necrosis of bone of hip, left ([place])   . Bulging lumbar disc   . Depression   . GERD (gastroesophageal reflux disease)   . HIV (human immunodeficiency virus infection) ([place]) 07/29/2017  . HIV infection ([place])   . Insomnia   . Migraines   . Nerve pain   . Perimenopausal   . Porphyria ([place])   . PTSD (post-traumatic stress disorder)   . Spinal stenosis of lumbosacral region   . Tendonitis    right arm     Social History  Substance Use Topics  . Smoking status: Never Smoker  . Smokeless tobacco: Never Used  . Alcohol use No    Family History  Problem Relation Age of Onset  . COPD Mother   . Osteoporosis Mother   . Hypertension Father   . Healthy Sister   . Heart disease Maternal Grandmother   . Heart disease Maternal Grandfather     Allergies  Allergen Reactions  . Levaquin [Levofloxacin] Rash  Difficulty breathing, mouth swelling/rash  . Compazine [Prochlorperazine Edisylate]     Muscle twitching  . Droperidol     uncontrolled muscle movements   . Imitrex [Sumatriptan]     Difficulty breathing, chest pain   . Reglan [Metoclopramide]     Uncontrolled twitching  . Sulfa Antibiotics Hives    Hives   . Thorazine [Chlorpromazine]     Uncontrolled muscle twitching    Objective:  Vitals:   07/29/17 1004  BP: 112/79  Pulse: 86  Temp: 98.2 F (36.8 C)  TempSrc: Oral  Weight: 178 lb (80.7 kg)   There is no height or weight on file to  calculate BMI.  Physical Exam  Constitutional: She is oriented to person, place, and time and well-developed, well-nourished, and in no distress.  HENT:  Mouth/Throat: Oropharynx is clear and moist. No oral lesions. No dental abscesses.  Eyes: No scleral icterus.  Cardiovascular: Normal rate, regular rhythm and normal heart sounds.   Pulmonary/Chest: Effort normal and breath sounds normal.  Abdominal: Soft. She exhibits no distension. There is no tenderness.  Musculoskeletal: Normal range of motion. She exhibits no tenderness.  Lymphadenopathy:    She has no cervical adenopathy.  Neurological: She is alert and oriented to person, place, and time.  Skin: Skin is warm and dry. No rash noted.  Psychiatric: Mood, affect and judgment normal.  Vitals reviewed.  I have reviewed all available records including notes and diagnostic testing sent form previous managing provider.   Lab Results Lab Results  Component Value Date   WBC 3.0 (L) 07/29/2017   HGB 13.5 07/29/2017   HCT 40.3 07/29/2017   MCV 93.9 07/29/2017   PLT 183 07/29/2017    Lab Results  Component Value Date   CREATININE 0.87 07/29/2017   BUN 12 07/29/2017   NA 139 07/29/2017   K 4.2 07/29/2017   CL 100 07/29/2017   CO2 28 07/29/2017    Lab Results  Component Value Date   ALT 15 07/29/2017   AST 20 07/29/2017   ALKPHOS 72 07/29/2017   BILITOT 0.2 07/29/2017    Lab Results  Component Value Date   CHOL 240 (H) 07/29/2017   HDL 51 07/29/2017   LDLCALC 124 (H) 07/29/2017   TRIG 323 (H) 07/29/2017   CHOLHDL 4.7 07/29/2017   HIV 1 RNA Quant (copies/mL)  Date Value  07/29/2017 <20 NOT DETECTED   CD4 T Cell Abs (/uL)  Date Value  07/29/2017 400   Lab Results  Component Value Date   HAV REACTIVE (A) 07/29/2017   Lab Results  Component Value Date   HEPBSAG NON-REACTIVE 07/29/2017   HEPBSAB NON-REACTIVE 07/29/2017   No results found for: HCVAB No results found for: CHLAMYDIAWP, N No results found for:  GCPROBEAPT Lab Results  Component Value Date   QUANTGOLD NEGATIVE 07/29/2017      Problem List Items Addressed This Visit      Digestive   Hx of hepatitis C    With cirrhosis. Previously completed 12 weeks of treatment with Harvoni per patient's account. Will check HepC RNA today to ensure cure. No sexual partners, IVDU or other behaviors to suggest risk for reinfection is high. May need follow up liver ultrasounds to screen for [facility].       Relevant Medications   clindamycin (CLEOCIN) 150 MG capsule   fluconazole (DIFLUCAN) 150 MG tablet   Acid reflux    Currently under control on regimen of Dexilant and ranitidine BID. Able to tolerate Genvoya again  without vomiting.       Relevant Medications   dexlansoprazole (DEXILANT) 60 MG capsule   ranitidine (ZANTAC) 150 MG tablet   dicyclomine (BENTYL) 20 MG tablet   ondansetron (ZOFRAN) 8 MG tablet   loperamide (IMODIUM A-D) 2 MG tablet     Other   HIV (human immunodeficiency virus infection) (HCC) (Chronic)    Excellent control on Genvoya from most recent labs. Will continue this for her. She will call back to let us know where to send prescription. I will check VL and CD4 count today as she is looking forward to hip replacement. Will have her come back in 4 months unless she needs to be seen sooner for surgical clearance from ID standpoint. Counseled on condom use.       Relevant Medications   clindamycin (CLEOCIN) 150 MG capsule   fluconazole (DIFLUCAN) 150 MG tablet   Other Relevant Orders   T-helper cell (CD4)- ([facility] clinic only) (Completed)   HIV 1 RNA quant-no reflex-bld (Completed)   COMPLETE METABOLIC PANEL WITH GFR (Completed)   CBC with Differential/Platelet (Completed)   Lipid panel (Completed)   HLA B*5701 (Completed)   Quantiferon tb gold assay (blood) (Completed)   Hemoglobin A1c (Completed)   Urinalysis (Completed)   Hepatitis C RNA quantitative (Completed)   Hepatitis B surface antibody (Completed)   Hepatitis  B surface antigen (Completed)   Hepatitis A antibody, total (Completed)   Healthcare maintenance    With history of serial abnormal pap smears I recommended to Jezebelle that she have a vaginal pap obtained being that she is HIV positive female. She routinely gets mammograms. Never had colonoscopy - will need screening at 46 yo. Up to date on vaccines. Will assess hepatitis serologies today for immunity.        Other Visit Diagnoses    Vaginal yeast infection    -  Primary   Relevant Medications   clindamycin (CLEOCIN) 150 MG capsule   fluconazole (DIFLUCAN) 150 MG tablet   Medication monitoring encounter       Relevant Orders   Lipid panel (Completed)   Hemoglobin A1c (Completed)   Urinalysis (Completed)   Encounter for long-term (current) use of medications       Relevant Orders   Lipid panel (Completed)   Hemoglobin A1c (Completed)   Urinalysis (Completed)      Janene Madeira, MSN, NP-C [facility]   08/01/17 10:38 AM

## 2017-07-29 NOTE — Patient Instructions (Signed)
Wonderful to meet you today!  Let us know when you figure out which pharmacy you would like for us to send Genvoya prescription to.    We will check lab work today.   Please sign up with MyChart to access your labs and set up email communication with our clinic for non-urgent medical concerns.   Please come back to see Judeth CornfieldStephanie in 4 months.

## 2017-07-30 DIAGNOSIS — K219 Gastro-esophageal reflux disease without esophagitis: Secondary | ICD-10-CM | POA: Insufficient documentation

## 2017-07-30 DIAGNOSIS — Z8619 Personal history of other infectious and parasitic diseases: Secondary | ICD-10-CM | POA: Insufficient documentation

## 2017-07-30 LAB — LIPID PANEL
Cholesterol: 240 mg/dL — ABNORMAL HIGH (ref <200)
HDL: 51 mg/dL (ref >50)
LDL Cholesterol: 124 mg/dL — ABNORMAL HIGH (ref <100)
Total CHOL/HDL Ratio: 4.7 Ratio (ref <5.0)
Triglycerides: 323 mg/dL — ABNORMAL HIGH (ref <150)
VLDL: 65 mg/dL — ABNORMAL HIGH (ref <30)

## 2017-07-30 LAB — COMPLETE METABOLIC PANEL WITH GFR
ALT: 15 U/L (ref 6–29)
AST: 20 U/L (ref 10–35)
Albumin: 4.3 g/dL (ref 3.6–5.1)
Alkaline Phosphatase: 72 U/L (ref 33–115)
BUN: 12 mg/dL (ref 7–25)
CO2: 28 mmol/L (ref 20–32)
Calcium: 9.4 mg/dL (ref 8.6–10.2)
Chloride: 100 mmol/L (ref 98–110)
Creat: 0.87 mg/dL (ref 0.50–1.10)
GFR, Est African American: >89 mL/min (ref >=60)
GFR, Est Non African American: 80 mL/min (ref >=60)
Glucose, Bld: 82 mg/dL (ref 65–99)
Potassium: 4.2 mmol/L (ref 3.5–5.3)
Sodium: 139 mmol/L (ref 135–146)
Total Bilirubin: 0.2 mg/dL (ref 0.2–1.2)
Total Protein: 7.8 g/dL (ref 6.1–8.1)

## 2017-07-30 LAB — URINALYSIS
Bilirubin Urine: NEGATIVE
Glucose, UA: NEGATIVE
Hgb urine dipstick: NEGATIVE
Ketones, ur: NEGATIVE
Nitrite: NEGATIVE
Protein, ur: NEGATIVE
Specific Gravity, Urine: 1.008 (ref 1.001–1.035)
pH: 7 (ref 5.0–8.0)

## 2017-07-30 LAB — HEPATITIS B SURFACE ANTIGEN: Hepatitis B Surface Ag: NONREACTIVE

## 2017-07-30 LAB — HEPATITIS B SURFACE ANTIBODY,QUALITATIVE: Hep B S Ab: NONREACTIVE

## 2017-07-30 LAB — T-HELPER CELL (CD4) - (RCID CLINIC ONLY)
CD4 % Helper T Cell: 28 % — ABNORMAL LOW (ref 33–55)
CD4 T Cell Abs: 400 /uL (ref 400–2700)

## 2017-07-30 LAB — HEMOGLOBIN A1C
Hgb A1c MFr Bld: 5 % (ref <5.7)
Mean Plasma Glucose: 97 mg/dL

## 2017-07-30 LAB — HEPATITIS A ANTIBODY, TOTAL: Hep A Total Ab: REACTIVE — AB

## 2017-07-30 NOTE — Assessment & Plan Note (Signed)
With cirrhosis. Previously completed 12 weeks of treatment with Harvoni per patient's account. Will check HepC RNA today to ensure cure. No sexual partners, IVDU or other behaviors to suggest risk for reinfection is high. May need follow up liver ultrasounds to screen for [facility].

## 2017-07-30 NOTE — Assessment & Plan Note (Addendum)
Excellent control on Genvoya from most recent labs. Will continue this for her. She will call back to let us know where to send prescription. I will check VL and CD4 count today as she is looking forward to hip replacement. Will have her come back in 4 months unless she needs to be seen sooner for surgical clearance from ID standpoint. Counseled on condom use.

## 2017-07-30 NOTE — Assessment & Plan Note (Signed)
Currently under control on regimen of Dexilant and ranitidine BID. Able to tolerate Genvoya again without vomiting.

## 2017-07-31 LAB — HEPATITIS C RNA QUANTITATIVE
HCV Quantitative Log: <1.18 Log IU/mL
HCV Quantitative: <15 IU/mL

## 2017-07-31 LAB — QUANTIFERON TB GOLD ASSAY (BLOOD)
Interferon Gamma Release Assay: NEGATIVE
Mitogen-Nil: >10 IU/mL
Quantiferon Nil Value: 0.09 IU/mL
Quantiferon Tb Ag Minus Nil Value: <0 IU/mL

## 2017-07-31 LAB — HIV-1 RNA QUANT-NO REFLEX-BLD
HIV 1 RNA Quant: <20 copies/mL
HIV-1 RNA Quant, Log: <1.3 Log copies/mL

## 2017-08-01 NOTE — Assessment & Plan Note (Addendum)
With history of serial abnormal pap smears I recommended to Laura Jones that she have a vaginal pap obtained being that she is HIV positive female. She routinely gets mammograms. Never had colonoscopy - will need screening at 46 yo. Up to date on vaccines. Will assess hepatitis serologies today for immunity.

## 2017-08-05 ENCOUNTER — Telehealth: Payer: Self-pay | Admitting: *Deleted

## 2017-08-05 DIAGNOSIS — B20 Human immunodeficiency virus [HIV] disease: Secondary | ICD-10-CM

## 2017-08-05 LAB — HLA B*5701: HLA-B*5701 w/rflx HLA-B High: NEGATIVE

## 2017-08-05 MED ORDER — ELVITEG-COBIC-EMTRICIT-TENOFAF 150-150-200-10 MG PO TABS: 1.0000 | ORAL_TABLET | Freq: Every day | ORAL | 5 refills | Status: DC

## 2017-08-05 NOTE — Telephone Encounter (Signed)
Awesome, done and patient notified.

## 2017-08-05 NOTE — Telephone Encounter (Signed)
Yes absolutely fine with me. She is new to the area and was not sure exactly where she was going to get her meds from.   Thank you!!

## 2017-08-05 NOTE — Addendum Note (Signed)
Addended by: Lurlean LeydenPOOLE, Climmie Buelow F on: 08/05/2017 03:51 PM   Modules accepted: Orders

## 2017-08-05 NOTE — Telephone Encounter (Signed)
Patient called to ask if we could send her medication [facility] [place]. Advised her the Darrin LuisGenvoya is not on her list but will ask Judeth CornfieldStephanie and if she gives the ok will send medication in as soon as possible.

## 2017-08-20 DIAGNOSIS — B20 Human immunodeficiency virus [HIV] disease: Secondary | ICD-10-CM | POA: Diagnosis not present

## 2017-08-20 DIAGNOSIS — K210 Gastro-esophageal reflux disease with esophagitis: Secondary | ICD-10-CM | POA: Diagnosis not present

## 2017-08-20 DIAGNOSIS — F330 Major depressive disorder, recurrent, mild: Secondary | ICD-10-CM | POA: Diagnosis not present

## 2017-08-20 DIAGNOSIS — M25552 Pain in left hip: Secondary | ICD-10-CM | POA: Diagnosis not present

## 2017-08-20 DIAGNOSIS — N3941 Urge incontinence: Secondary | ICD-10-CM | POA: Diagnosis not present

## 2017-08-20 DIAGNOSIS — G43009 Migraine without aura, not intractable, without status migrainosus: Secondary | ICD-10-CM | POA: Diagnosis not present

## 2017-08-29 DIAGNOSIS — Z131 Encounter for screening for diabetes mellitus: Secondary | ICD-10-CM | POA: Diagnosis not present

## 2017-08-29 DIAGNOSIS — Z79899 Other long term (current) drug therapy: Secondary | ICD-10-CM | POA: Diagnosis not present

## 2017-09-18 DIAGNOSIS — M879 Osteonecrosis, unspecified: Secondary | ICD-10-CM | POA: Diagnosis not present

## 2017-09-18 DIAGNOSIS — M25552 Pain in left hip: Secondary | ICD-10-CM | POA: Diagnosis not present

## 2017-09-18 DIAGNOSIS — G8929 Other chronic pain: Secondary | ICD-10-CM | POA: Diagnosis not present

## 2017-09-18 DIAGNOSIS — M2170 Unequal limb length (acquired), unspecified site: Secondary | ICD-10-CM | POA: Diagnosis not present

## 2017-09-18 DIAGNOSIS — Z96641 Presence of right artificial hip joint: Secondary | ICD-10-CM | POA: Insufficient documentation

## 2017-11-26 DIAGNOSIS — Z21 Asymptomatic human immunodeficiency virus [HIV] infection status: Secondary | ICD-10-CM | POA: Diagnosis not present

## 2017-11-26 DIAGNOSIS — M87852 Other osteonecrosis, left femur: Secondary | ICD-10-CM | POA: Diagnosis not present

## 2017-11-26 DIAGNOSIS — Z79899 Other long term (current) drug therapy: Secondary | ICD-10-CM | POA: Diagnosis not present

## 2017-11-26 DIAGNOSIS — M25551 Pain in right hip: Secondary | ICD-10-CM | POA: Diagnosis not present

## 2017-11-26 DIAGNOSIS — X500XXA Overexertion from strenuous movement or load, initial encounter: Secondary | ICD-10-CM | POA: Diagnosis not present

## 2017-11-26 DIAGNOSIS — S79911A Unspecified injury of right hip, initial encounter: Secondary | ICD-10-CM | POA: Diagnosis not present

## 2017-11-26 DIAGNOSIS — M8788 Other osteonecrosis, other site: Secondary | ICD-10-CM | POA: Diagnosis not present

## 2017-11-26 DIAGNOSIS — Z96642 Presence of left artificial hip joint: Secondary | ICD-10-CM | POA: Diagnosis not present

## 2017-11-26 DIAGNOSIS — S76012A Strain of muscle, fascia and tendon of left hip, initial encounter: Secondary | ICD-10-CM | POA: Diagnosis not present

## 2017-12-05 ENCOUNTER — Ambulatory Visit (INDEPENDENT_AMBULATORY_CARE_PROVIDER_SITE_OTHER): Payer: Medicare Other | Admitting: Infectious Diseases

## 2017-12-05 ENCOUNTER — Encounter: Payer: Self-pay | Admitting: Infectious Diseases

## 2017-12-05 VITALS — BP 117/81 | HR 88 | Temp 98.7°F | Wt 181.0 lb

## 2017-12-05 DIAGNOSIS — E7800 Pure hypercholesterolemia, unspecified: Secondary | ICD-10-CM

## 2017-12-05 DIAGNOSIS — F330 Major depressive disorder, recurrent, mild: Secondary | ICD-10-CM | POA: Diagnosis not present

## 2017-12-05 DIAGNOSIS — B20 Human immunodeficiency virus [HIV] disease: Secondary | ICD-10-CM

## 2017-12-05 DIAGNOSIS — Z23 Encounter for immunization: Secondary | ICD-10-CM | POA: Diagnosis not present

## 2017-12-05 DIAGNOSIS — K210 Gastro-esophageal reflux disease with esophagitis: Secondary | ICD-10-CM | POA: Diagnosis not present

## 2017-12-05 DIAGNOSIS — M25552 Pain in left hip: Secondary | ICD-10-CM | POA: Diagnosis not present

## 2017-12-05 DIAGNOSIS — Z79899 Other long term (current) drug therapy: Secondary | ICD-10-CM | POA: Diagnosis not present

## 2017-12-05 DIAGNOSIS — G8929 Other chronic pain: Secondary | ICD-10-CM

## 2017-12-05 DIAGNOSIS — G43009 Migraine without aura, not intractable, without status migrainosus: Secondary | ICD-10-CM | POA: Diagnosis not present

## 2017-12-05 MED ORDER — TRAMADOL HCL 50 MG PO TABS: 50.0000 mg | ORAL_TABLET | Freq: Two times a day (BID) | ORAL | 0 refills | Status: DC | PRN

## 2017-12-05 NOTE — Patient Instructions (Addendum)
One new prescription to help you through until your orthopedic appointment in January.   Limit your tylenol to no more than 6 regular strength tablets in a 24 hour period. I still would like for you to try these medications first before the Ultram.   Labs today and will see you back in 6 months as long as these look OK.

## 2017-12-05 NOTE — Progress Notes (Signed)
Laura LagerBabette M Jones  12/29/1970 [id number] PCP: Patient, No Pcp Per   Reason for Visit: routine HIV follow up  Brief Narrative:  Laura RoamBabette is a 46 y.o. caucasian female with HIV infection. She currently lives in [place], [place] after relocating from [place] in 2018 where she was in good [facility] at [facility]. She has many other health conditions including history of hep c infection (s/p treatment). Has had HIV "a long time." History of OIs: none known. HIV Risk: heterosexual contact Genotype: K103N - NNRTI resistance   Patient Active Problem List   Diagnosis Date Noted  . Chronic left hip pain 12/08/2017  . Hypercholesteremia 12/08/2017  . Hx of hepatitis C 07/30/2017  . Acid reflux 07/30/2017  . HIV (human immunodeficiency virus infection) (HCC) 07/29/2017  . Healthcare maintenance 07/29/2017    HPI:  HIV = Continues on Genvoya and is tolerating this regimen well. Has not missed any doses since last office visit with me and has had no problem keeping the medication down.   Chronic Pain r/t AVN of hip = she has an upcoming appointment with orthopedic surgeon in 1 month.   Health Maintenance = Laura Jones reports she is s/p total hysterectomy and has not had pap smears in several years. She does tell me she has previously had several interventions for abnormal pap smears including colposcopy/biopsy. Not currently smoking and no alcohol use.   Review of Systems: Review of Systems  Constitutional: Negative for chills, fever, malaise/fatigue and weight loss.  HENT: Negative for sore throat.        No dental problems  Respiratory: Negative for cough and sputum production.   Cardiovascular: Negative for chest pain and leg swelling.  Gastrointestinal: Negative for abdominal pain, diarrhea and vomiting.  Genitourinary: Negative for dysuria and flank pain.  Musculoskeletal: Positive for joint pain (left hip ). Negative for myalgias and neck pain.  Skin: Negative for rash.    Neurological: Negative for dizziness, tingling and headaches.  Psychiatric/Behavioral: Negative for depression. The patient is not nervous/anxious and does not have insomnia.     Past Medical History:  Diagnosis Date  . Anxiety   . Arthritis    left knee  . Avascular necrosis of bone of hip, left (HCC)   . Bulging lumbar disc   . Depression   . GERD (gastroesophageal reflux disease)   . HIV (human immunodeficiency virus infection) (HCC) 07/29/2017  . HIV infection (HCC)   . Insomnia   . Migraines   . Nerve pain   . Perimenopausal   . Porphyria (HCC)   . PTSD (post-traumatic stress disorder)   . Spinal stenosis of lumbosacral region   . Tendonitis    right arm     Social History   Tobacco Use  . Smoking status: Never Smoker  . Smokeless tobacco: Never Used  Substance Use Topics  . Alcohol use: No  . Drug use: No    Family History  Problem Relation Age of Onset  . COPD Mother   . Osteoporosis Mother   . Hypertension Father   . Healthy Sister   . Heart disease Maternal Grandmother   . Heart disease Maternal Grandfather     Allergies  Allergen Reactions  . Levaquin [Levofloxacin] Rash    Difficulty breathing, mouth swelling/rash  . Compazine [Prochlorperazine Edisylate]     Muscle twitching  . Droperidol     uncontrolled muscle movements   . Imitrex [Sumatriptan]     Difficulty breathing, chest pain   .  Reglan [Metoclopramide]     Uncontrolled twitching  . Sulfa Antibiotics Hives    Hives   . Thorazine [Chlorpromazine]     Uncontrolled muscle twitching    Objective:  Vitals:   12/05/17 1118  BP: 117/81  Pulse: 88  Temp: 98.7 F (37.1 C)  TempSrc: Oral  Weight: 181 lb (82.1 kg)   There is no height or weight on file to calculate BMI.  Physical Exam  Constitutional: She is oriented to person, place, and time and well-developed, well-nourished, and in no distress.  HENT:  Mouth/Throat: Oropharynx is clear and moist. No oral lesions. No dental  abscesses.  Missing teeth   Eyes: No scleral icterus.  Cardiovascular: Normal rate, regular rhythm and normal heart sounds.  Pulmonary/Chest: Effort normal and breath sounds normal.  Abdominal: Soft. She exhibits no distension. There is no tenderness.  Musculoskeletal: She exhibits no tenderness.       Left hip: She exhibits decreased range of motion.  Ambulates with use of cane   Lymphadenopathy:    She has no cervical adenopathy.  Neurological: She is alert and oriented to person, place, and time.  Skin: Skin is warm and dry. No rash noted.  Psychiatric: Mood, affect and judgment normal.  Vitals reviewed.  I have reviewed all available records including notes and diagnostic testing sent form previous managing provider.   Lab Results Lab Results  Component Value Date   WBC 3.0 (L) 07/29/2017   HGB 13.5 07/29/2017   HCT 40.3 07/29/2017   MCV 93.9 07/29/2017   PLT 183 07/29/2017    Lab Results  Component Value Date   CREATININE 0.87 07/29/2017   BUN 12 07/29/2017   NA 139 07/29/2017   K 4.2 07/29/2017   CL 100 07/29/2017   CO2 28 07/29/2017    Lab Results  Component Value Date   ALT 15 07/29/2017   AST 20 07/29/2017   ALKPHOS 72 07/29/2017   BILITOT 0.2 07/29/2017    Lab Results  Component Value Date   CHOL 240 (H) 07/29/2017   HDL 51 07/29/2017   LDLCALC 124 (H) 07/29/2017   TRIG 323 (H) 07/29/2017   CHOLHDL 4.7 07/29/2017   HIV 1 RNA Quant (copies/mL)  Date Value  07/29/2017 <20 NOT DETECTED   CD4 T Cell Abs (/uL)  Date Value  12/05/2017 570  07/29/2017 400   Lab Results  Component Value Date   HAV REACTIVE (A) 07/29/2017   Lab Results  Component Value Date   HEPBSAG NON-REACTIVE 07/29/2017   HEPBSAB NON-REACTIVE 07/29/2017   No results found for: HCVAB No results found for: CHLAMYDIAWP, N No results found for: GCPROBEAPT Lab Results  Component Value Date   QUANTGOLD NEGATIVE 07/29/2017      Problem List Items Addressed This Visit       Other   Chronic left hip pain    D/T known history of AVN. She reports pain 7-8/10 on daily basis. Reviewed NCCSR website and no controlled substances filled since 01/2017. Will prescribe one time 30d fill of tramadol 50 mg 1 tab BIDPRN for chronic pain until her appointment with surgeon for consideration of hip replacement. Advised to continue with tylenol as first choice for pain with this for moderate-severe pain as instructed. She understands this is a one time fill.       Relevant Medications   traMADol (ULTRAM) 50 MG tablet   HIV (human immunodeficiency virus infection) (HCC) - Primary (Chronic)    Undetectable on recent labs. Will  continue Genvoya. Will check VL once today and she can return in 6 months for repeat labs and continued HIV care. Offered/declined condoms.       Relevant Medications   elvitegravir-cobicistat-emtricitabine-tenofovir (GENVOYA) 150-150-200-10 MG TABS tablet   Other Relevant Orders   HIV 1 RNA quant-no reflex-bld   T-helper cell (CD4)- ([facility] clinic only) (Completed)   HIV 1 RNA quant-no reflex-bld   T-helper cell (CD4)- ([facility] clinic only)   Hypercholesteremia    Reviewed lipid panel results with her. She has already been started on this per her PCP. I agree with adding this to lower r/f CV related events. Counseled on HIV increasing risk for heart attack/stroke and it is best to minimize her risk with addition of statin therapy. Will reassess in 6 months.        Other Visit Diagnoses    Long term use of drug       Relevant Orders   Lipid panel   Need for immunization against influenza       Relevant Orders   Flu Vaccine QUAD 36+ mos IM (Completed)   Human immunodeficiency virus (HIV) disease (HCC)       Relevant Medications   elvitegravir-cobicistat-emtricitabine-tenofovir (GENVOYA) 150-150-200-10 MG TABS tablet      Rexene AlbertsStephanie Dixon, MSN, NP-C [facility]   12/08/17 4:13 PM

## 2017-12-06 LAB — T-HELPER CELL (CD4) - (RCID CLINIC ONLY)
CD4 % Helper T Cell: 28 % — ABNORMAL LOW (ref 33–55)
CD4 T Cell Abs: 570 /uL (ref 400–2700)

## 2017-12-08 DIAGNOSIS — G8929 Other chronic pain: Secondary | ICD-10-CM | POA: Insufficient documentation

## 2017-12-08 DIAGNOSIS — M25552 Pain in left hip: Secondary | ICD-10-CM

## 2017-12-08 DIAGNOSIS — E7800 Pure hypercholesterolemia, unspecified: Secondary | ICD-10-CM | POA: Insufficient documentation

## 2017-12-08 MED ORDER — ELVITEG-COBIC-EMTRICIT-TENOFAF 150-150-200-10 MG PO TABS: 1.0000 | ORAL_TABLET | Freq: Every day | ORAL | 6 refills | Status: DC

## 2017-12-08 NOTE — Assessment & Plan Note (Signed)
Undetectable on recent labs. Will continue Genvoya. Will check VL once today and she can return in 6 months for repeat labs and continued HIV care. Offered/declined condoms.

## 2017-12-08 NOTE — Assessment & Plan Note (Addendum)
D/T known history of AVN. She reports pain 7-8/10 on daily basis. Reviewed NCCSR website and no controlled substances filled since 01/2017. Will prescribe one time 30d fill of tramadol 50 mg 1 tab BIDPRN for chronic pain until her appointment with surgeon for consideration of hip replacement. Advised to continue with tylenol as first choice for pain with this for moderate-severe pain as instructed. She understands this is a one time fill.

## 2017-12-08 NOTE — Assessment & Plan Note (Signed)
Reviewed lipid panel results with her. She has already been started on this per her PCP. I agree with adding this to lower r/f CV related events. Counseled on HIV increasing risk for heart attack/stroke and it is best to minimize her risk with addition of statin therapy. Will reassess in 6 months.

## 2017-12-09 LAB — HIV-1 RNA QUANT-NO REFLEX-BLD
HIV 1 RNA Quant: <20 copies/mL — AB
HIV-1 RNA Quant, Log: <1.3 Log copies/mL — AB

## 2017-12-25 ENCOUNTER — Encounter: Payer: Self-pay | Admitting: Licensed Clinical Social Worker

## 2018-01-02 ENCOUNTER — Other Ambulatory Visit: Payer: Self-pay | Admitting: Infectious Diseases

## 2018-01-06 ENCOUNTER — Other Ambulatory Visit: Payer: Self-pay | Admitting: Infectious Diseases

## 2018-01-06 NOTE — Telephone Encounter (Signed)
Refill2

## 2018-01-16 ENCOUNTER — Ambulatory Visit: Payer: Medicare Other

## 2018-01-24 ENCOUNTER — Encounter: Payer: Self-pay | Admitting: *Deleted

## 2018-03-05 ENCOUNTER — Ambulatory Visit: Payer: Medicare Other

## 2018-03-06 ENCOUNTER — Ambulatory Visit: Payer: Medicare Other

## 2018-03-10 DIAGNOSIS — Z1231 Encounter for screening mammogram for malignant neoplasm of breast: Secondary | ICD-10-CM | POA: Diagnosis not present

## 2018-03-20 DIAGNOSIS — M87852 Other osteonecrosis, left femur: Secondary | ICD-10-CM | POA: Diagnosis not present

## 2018-04-07 DIAGNOSIS — G43009 Migraine without aura, not intractable, without status migrainosus: Secondary | ICD-10-CM | POA: Diagnosis not present

## 2018-04-07 DIAGNOSIS — M25552 Pain in left hip: Secondary | ICD-10-CM | POA: Diagnosis not present

## 2018-04-07 DIAGNOSIS — F330 Major depressive disorder, recurrent, mild: Secondary | ICD-10-CM | POA: Diagnosis not present

## 2018-04-07 DIAGNOSIS — B20 Human immunodeficiency virus [HIV] disease: Secondary | ICD-10-CM | POA: Diagnosis not present

## 2018-04-07 DIAGNOSIS — Z1389 Encounter for screening for other disorder: Secondary | ICD-10-CM | POA: Diagnosis not present

## 2018-04-07 DIAGNOSIS — K210 Gastro-esophageal reflux disease with esophagitis: Secondary | ICD-10-CM | POA: Diagnosis not present

## 2018-04-07 DIAGNOSIS — Z0000 Encounter for general adult medical examination without abnormal findings: Secondary | ICD-10-CM | POA: Diagnosis not present

## 2018-05-01 DIAGNOSIS — A630 Anogenital (venereal) warts: Secondary | ICD-10-CM | POA: Diagnosis not present

## 2018-05-01 DIAGNOSIS — N9089 Other specified noninflammatory disorders of vulva and perineum: Secondary | ICD-10-CM | POA: Diagnosis not present

## 2018-05-12 ENCOUNTER — Telehealth: Payer: Self-pay

## 2018-05-12 NOTE — Telephone Encounter (Signed)
Pt called today stating that her Gynecologist had informed she had tested positive for HPV and was told to call her infectious disease md. Pt had a question if she should come into our office to do some labs to check on her "numbers".  Will ask Judeth Cornfield if it is okay for the pt to come into the clinic to have some labs done. Lorenso Courier, [place]

## 2018-05-12 NOTE — Telephone Encounter (Signed)
Can she have her GYN fax me her records from her recent PAP?   Many women have HPV but it depends on if it causes cellular changes to her cervix and what kind of HPV - if there is abnormal cells on her pap her GYN should perform follow up colposcopy procedure - I can follow up on this for her if they fax [place] the records. I would certainly at minimum recommend a follow up pap smear in 6 - 12 months to follow (which I can also help with).   I do see she has an upcoming surgery in June for her hip replacement - if she would like to come for a lab visit early in June so we have her HIV labs back prior to surgery I think that is good to know prior to going in. Labs the first week of June and she can keep her appointment with me on 6/24.

## 2018-05-13 DIAGNOSIS — S79911A Unspecified injury of right hip, initial encounter: Secondary | ICD-10-CM | POA: Diagnosis not present

## 2018-05-13 DIAGNOSIS — T148XXA Other injury of unspecified body region, initial encounter: Secondary | ICD-10-CM | POA: Diagnosis not present

## 2018-05-13 DIAGNOSIS — M25551 Pain in right hip: Secondary | ICD-10-CM | POA: Diagnosis not present

## 2018-05-13 DIAGNOSIS — S79912A Unspecified injury of left hip, initial encounter: Secondary | ICD-10-CM | POA: Diagnosis not present

## 2018-05-13 DIAGNOSIS — M879 Osteonecrosis, unspecified: Secondary | ICD-10-CM | POA: Diagnosis not present

## 2018-05-13 NOTE — Telephone Encounter (Signed)
Left a vm for the pt to call us back to go over her concerns about her pap and also to get her scheduled for a lab visit.  Lorenso Courier, [place]

## 2018-05-15 NOTE — Telephone Encounter (Signed)
Pt called to schedule an appt for labs two weeks before her appt with Rexene Alberts. PT will ask for her Gyn to fax results on her recent pap to our office. Pt would like for Judeth Cornfield to know that during the pap a biopsy was not done and that there is a wart on the outside of her vagina.  Pt would like to go over all labs and pap results with Judeth Cornfield during her appt. Lorenso Courier, [place]

## 2018-06-02 ENCOUNTER — Other Ambulatory Visit: Payer: Medicare Other

## 2018-06-13 NOTE — H&P (Signed)
TOTAL HIP ADMISSION H&P  Patient is admitted for left total hip arthroplasty.  Subjective:  Chief Complaint: left hip pain  HPI: Laura Jones, 47 y.o. female, has a history of pain and functional disability in the left hip(s) due to avascular necrosis and patient has failed non-surgical conservative treatments for greater than 12 weeks to include NSAID's and/or analgesics, use of assistive devices and activity modification.  Onset of symptoms was gradual starting around October 2017 with gradually worsening course since that time.The patient noted no past surgery on the left hip(s).  Patient currently rates pain in the left hip at 6 out of 10 with activity. Patient has night pain, worsening of pain with activity and weight bearing and decreased range of motion. Patient has evidence of stage III osteonecrosis with collapse of the femoral head and significant subchondral cystic formation by imaging studies. This condition presents safety issues increasing the risk of falls. There is no current active infection.  Patient Active Problem List   Diagnosis Date Noted  . Chronic left hip pain 12/08/2017  . Hypercholesteremia 12/08/2017  . Hx of hepatitis C 07/30/2017  . Acid reflux 07/30/2017  . HIV (human immunodeficiency virus infection) (HCC) 07/29/2017  . Healthcare maintenance 07/29/2017   Past Medical History:  Diagnosis Date  . Anemia    hx of   . Anxiety   . Arthritis    left knee  . Avascular necrosis of bone of hip, left (HCC)   . Bulging lumbar disc   . Depression   . Dyspnea    with excertion  . GERD (gastroesophageal reflux disease)   . Hepatitis    C treated 2 years ago went undetected  . History of kidney stones   . HIV (human immunodeficiency virus infection) (HCC) 07/29/2017  . HIV infection (HCC)   . Insomnia   . Migraines   . Nerve pain   . Perimenopausal   . Porphyria (HCC)   . PTSD (post-traumatic stress disorder)   . Spinal stenosis of lumbosacral region     . Tendonitis    right arm     Past Surgical History:  Procedure Laterality Date  . ABDOMINAL HYSTERECTOMY    . CESAREAN SECTION    . ESOPHAGOGASTRODUODENOSCOPY     x2  . INCONTINENCE SURGERY    . JOINT REPLACEMENT     left hip replacement Dr. Lequita HaltAluisio 06-18-18  . TOTAL HIP ARTHROPLASTY Right 02/2013  . TUBAL LIGATION      No current facility-administered medications for this encounter.    Current Outpatient Medications  Medication Sig Dispense Refill Last Dose  . acetaminophen (TYLENOL) 325 MG tablet Take 650 mg by mouth every 6 (six) hours as needed.   Taking  . buPROPion (WELLBUTRIN) 100 MG tablet Take 100 mg by mouth at bedtime.   Taking  . buPROPion (ZYBAN) 150 MG 12 hr tablet Take 150 mg by mouth 2 (two) times daily.     . Butalbital-Acetaminophen (BUTALBITAL-APAP) 50-325 MG TABS Take 1 tablet by mouth as needed.   Taking  . celecoxib (CELEBREX) 100 MG capsule Take 100 mg by mouth 2 (two) times daily.   Taking  . citalopram (CELEXA) 40 MG tablet Take 40 mg by mouth daily.   Taking  . clindamycin (CLEOCIN) 150 MG capsule Take 600 mg by mouth as needed.   Taking  . cyclobenzaprine (FLEXERIL) 10 MG tablet Take 10 mg by mouth 2 (two) times daily.   Taking  . dexlansoprazole (DEXILANT) 60 MG capsule  Take 60 mg by mouth daily.   Taking  . dicyclomine (BENTYL) 20 MG tablet Take 40 mg by mouth 2 (two) times daily.   Taking  . elvitegravir-cobicistat-emtricitabine-tenofovir (GENVOYA) 150-150-200-10 MG TABS tablet Take 1 tablet by mouth daily with breakfast. 30 tablet 6   . fluconazole (DIFLUCAN) 150 MG tablet Take 150 mg by mouth as needed.   Taking  . hydrOXYzine (ATARAX/VISTARIL) 25 MG tablet Take 50 mg by mouth every 8 (eight) hours as needed.   Taking  . loperamide (IMODIUM A-D) 2 MG tablet Take 2 mg by mouth as needed for diarrhea or loose stools.   Taking  . loratadine (CLARITIN) 10 MG tablet Take 10 mg by mouth daily as needed for allergies.   Taking  . mirabegron ER (MYRBETRIQ)  50 MG TB24 tablet Take 50 mg by mouth daily.   Taking  . ondansetron (ZOFRAN) 8 MG tablet Take by mouth every 8 (eight) hours as needed for nausea or vomiting.   Taking  . ranitidine (ZANTAC) 150 MG tablet Take 150 mg by mouth 2 (two) times daily.   Taking  . traMADol (ULTRAM) 50 MG tablet TAKE ONE TABLET BY MOUTH EVERY 12 HOURS AS NEEDED 60 tablet 0   . traZODone (DESYREL) 100 MG tablet Take 100-200 mg by mouth at bedtime.   Taking   Allergies  Allergen Reactions  . Levaquin [Levofloxacin] Rash    Difficulty breathing, mouth swelling/rash  . Fish Oil Nausea And Vomiting  . Compazine [Prochlorperazine Edisylate]     Muscle twitching  . Droperidol     uncontrolled muscle movements   . Imitrex [Sumatriptan]     Difficulty breathing, chest pain   . Reglan [Metoclopramide]     Uncontrolled twitching  . Sulfa Antibiotics Hives    Hives   . Thorazine [Chlorpromazine]     Uncontrolled muscle twitching    Social History   Tobacco Use  . Smoking status: Never Smoker  . Smokeless tobacco: Never Used  Substance Use Topics  . Alcohol use: No    Family History  Problem Relation Age of Onset  . COPD Mother   . Osteoporosis Mother   . Hypertension Father   . Healthy Sister   . Heart disease Maternal Grandmother   . Heart disease Maternal Grandfather      Review of Systems  Constitutional: Negative for chills and fever.  HENT: Negative for congestion, sore throat and tinnitus.   Eyes: Negative for double vision, photophobia and pain.  Respiratory: Negative for cough, shortness of breath and wheezing.   Cardiovascular: Negative for chest pain, palpitations and orthopnea.  Gastrointestinal: Negative for heartburn, nausea and vomiting.  Genitourinary: Negative for dysuria, frequency and urgency.  Musculoskeletal: Positive for joint pain.  Neurological: Negative for dizziness, weakness and headaches.  Psychiatric/Behavioral: Negative for depression.    Objective:  Physical Exam    Overweight and well developed. General: Alert and oriented x3, cooperative and pleasant, no acute distress. Head: normocephalic, atraumatic, neck supple. Eyes: EOMI. Respiratory: breath sounds clear in all fields, no wheezing, rales, or rhonchi. Cardiovascular: Regular rate and rhythm, no murmurs, gallops or rubs.  Abdomen: non-tender to palpation and soft, normoactive bowel sounds. Musculoskeletal: Significant antalgic gait pattern without the use of assisted devices.  Right Hip Exam: ROM: Flexion to 110 degrees, Internal rotation 20 degrees, external rotation 30 degrees, and abduction 30 degrees without discomfort.  There is no tenderness over the greater trochanter.  There is no pain on provocative testing of the hip.  Left Hip Exam: ROM: Flexion to 90 degrees, No internal rotation, external rotation 10 degrees, and abduction 20 degrees with significant pain.  There is no tenderness over the greater trochanter.  There is pain on provocative testing of the hip. Calves soft and nontender. Motor function intact in LE. Strength 5/5 LE bilaterally. Neuro: Distal pulses 2+. Sensation to light touch intact in LE.   Vital signs in last 24 hours: Blood pressure: 128/86 mmHg Pulse: 88 bpm  Labs:  Estimated body mass index is 34.58 kg/m as calculated from the following:   Height as of 06/16/18: 5\' 1"  (1.549 m).   Weight as of 06/16/18: 83 kg (183 lb).   Imaging Review Plain radiographs demonstratestage III osteonecrosis with collapse of the left femoral head and significant subchondral cystic formation. The bone quality appears to be adequate for age and reported activity level.  Preoperative templating of the joint replacement has been completed, documented, and submitted to the Operating Room personnel in order to optimize intra-operative equipment management.   Assessment/Plan:  End stage arthritis, left hip(s)  The patient history, physical examination, clinical judgement of the  provider and imaging studies are consistent with end stage degenerative joint disease of the left hip(s) and total hip arthroplasty is deemed medically necessary. The treatment options including medical management, injection therapy, arthroscopy and arthroplasty were discussed at length. The risks and benefits of total hip arthroplasty were presented and reviewed. The risks due to aseptic loosening, infection, stiffness, dislocation/subluxation,  thromboembolic complications and other imponderables were discussed.  The patient acknowledged the explanation, agreed to proceed with the plan and consent was signed. Patient is being admitted for inpatient treatment for surgery, pain control, PT, OT, prophylactic antibiotics, VTE prophylaxis, progressive ambulation and ADL's and discharge planning.The patient is planning to be discharged home with home health services.  Therapy Plans: HHPT then outpatient therapy Disposition: Home with mother Planned DVT Prophylaxis: Aspirin 325 mg BID DME needed: None PCP: Dr. Olena Leatherwood [facility] Internal Medicine) Internal Medicine Provider: Rexene Alberts FNP TXA: IV Allergies: Reglan, sulfa, levaquin  - Patient was instructed on what medications to stop prior to surgery. - Follow-up visit in 2 weeks with Dr. Lequita Halt - Begin physical therapy following surgery - Pre-operative lab work as pre-surgical testing - Prescriptions will be provided in hospital at time of discharge  Arther Abbott, PA-C Orthopedic Surgery [facility] [place]

## 2018-06-16 ENCOUNTER — Encounter (HOSPITAL_COMMUNITY): Payer: Self-pay

## 2018-06-16 ENCOUNTER — Encounter (HOSPITAL_COMMUNITY)
Admission: RE | Admit: 2018-06-16 | Discharge: 2018-06-16 | Disposition: A | Discharge status: Home / Self Care | Payer: Medicare Other | Source: Ambulatory Visit | Attending: Orthopedic Surgery | Admitting: Orthopedic Surgery

## 2018-06-16 ENCOUNTER — Other Ambulatory Visit: Payer: Self-pay

## 2018-06-16 ENCOUNTER — Ambulatory Visit: Payer: Medicare Other | Admitting: Infectious Diseases

## 2018-06-16 DIAGNOSIS — R51 Headache: Secondary | ICD-10-CM | POA: Insufficient documentation

## 2018-06-16 DIAGNOSIS — F419 Anxiety disorder, unspecified: Secondary | ICD-10-CM

## 2018-06-16 DIAGNOSIS — Z881 Allergy status to other antibiotic agents status: Secondary | ICD-10-CM | POA: Diagnosis not present

## 2018-06-16 DIAGNOSIS — B20 Human immunodeficiency virus [HIV] disease: Secondary | ICD-10-CM | POA: Insufficient documentation

## 2018-06-16 DIAGNOSIS — K219 Gastro-esophageal reflux disease without esophagitis: Secondary | ICD-10-CM

## 2018-06-16 DIAGNOSIS — M1612 Unilateral primary osteoarthritis, left hip: Secondary | ICD-10-CM

## 2018-06-16 DIAGNOSIS — Z79891 Long term (current) use of opiate analgesic: Secondary | ICD-10-CM | POA: Diagnosis not present

## 2018-06-16 DIAGNOSIS — Z6831 Body mass index (BMI) 31.0-31.9, adult: Secondary | ICD-10-CM

## 2018-06-16 DIAGNOSIS — E669 Obesity, unspecified: Secondary | ICD-10-CM

## 2018-06-16 DIAGNOSIS — G4700 Insomnia, unspecified: Secondary | ICD-10-CM | POA: Diagnosis not present

## 2018-06-16 DIAGNOSIS — Z8619 Personal history of other infectious and parasitic diseases: Secondary | ICD-10-CM | POA: Diagnosis not present

## 2018-06-16 DIAGNOSIS — Z882 Allergy status to sulfonamides status: Secondary | ICD-10-CM | POA: Diagnosis not present

## 2018-06-16 DIAGNOSIS — E7800 Pure hypercholesterolemia, unspecified: Secondary | ICD-10-CM | POA: Diagnosis not present

## 2018-06-16 DIAGNOSIS — Z01812 Encounter for preprocedural laboratory examination: Secondary | ICD-10-CM

## 2018-06-16 DIAGNOSIS — F329 Major depressive disorder, single episode, unspecified: Secondary | ICD-10-CM

## 2018-06-16 DIAGNOSIS — Z79899 Other long term (current) drug therapy: Secondary | ICD-10-CM | POA: Diagnosis not present

## 2018-06-16 DIAGNOSIS — Z825 Family history of asthma and other chronic lower respiratory diseases: Secondary | ICD-10-CM | POA: Diagnosis not present

## 2018-06-16 DIAGNOSIS — Z96641 Presence of right artificial hip joint: Secondary | ICD-10-CM | POA: Diagnosis not present

## 2018-06-16 DIAGNOSIS — Z87442 Personal history of urinary calculi: Secondary | ICD-10-CM | POA: Diagnosis not present

## 2018-06-16 DIAGNOSIS — Z8262 Family history of osteoporosis: Secondary | ICD-10-CM | POA: Diagnosis not present

## 2018-06-16 DIAGNOSIS — Z888 Allergy status to other drugs, medicaments and biological substances status: Secondary | ICD-10-CM | POA: Diagnosis not present

## 2018-06-16 DIAGNOSIS — Z8249 Family history of ischemic heart disease and other diseases of the circulatory system: Secondary | ICD-10-CM | POA: Diagnosis not present

## 2018-06-16 DIAGNOSIS — D649 Anemia, unspecified: Secondary | ICD-10-CM | POA: Diagnosis not present

## 2018-06-16 DIAGNOSIS — M8788 Other osteonecrosis, other site: Secondary | ICD-10-CM | POA: Diagnosis not present

## 2018-06-16 HISTORY — DX: Inflammatory liver disease, unspecified: K75.9

## 2018-06-16 HISTORY — DX: Dyspnea, unspecified: R06.00

## 2018-06-16 HISTORY — DX: Personal history of urinary calculi: Z87.442

## 2018-06-16 HISTORY — DX: Anemia, unspecified: D64.9

## 2018-06-16 LAB — URINALYSIS, ROUTINE W REFLEX MICROSCOPIC
Bilirubin Urine: NEGATIVE
Glucose, UA: 50 mg/dL — AB
Hgb urine dipstick: NEGATIVE
Ketones, ur: NEGATIVE mg/dL
Leukocytes, UA: NEGATIVE
Nitrite: NEGATIVE
Protein, ur: NEGATIVE mg/dL
Specific Gravity, Urine: 1.004 — ABNORMAL LOW (ref 1.005–1.030)
pH: 7 (ref 5.0–8.0)

## 2018-06-16 LAB — COMPREHENSIVE METABOLIC PANEL
ALT: 19 U/L (ref 14–54)
AST: 21 U/L (ref 15–41)
Albumin: 4.1 g/dL (ref 3.5–5.0)
Alkaline Phosphatase: 81 U/L (ref 38–126)
Anion gap: 8 (ref 5–15)
BUN: 11 mg/dL (ref 6–20)
CO2: 29 mmol/L (ref 22–32)
Calcium: 9.1 mg/dL (ref 8.9–10.3)
Chloride: 104 mmol/L (ref 101–111)
Creatinine, Ser: 0.88 mg/dL (ref 0.44–1.00)
GFR calc Af Amer: >60 mL/min (ref >60)
GFR calc non Af Amer: >60 mL/min (ref >60)
Glucose, Bld: 91 mg/dL (ref 65–99)
Potassium: 3.9 mmol/L (ref 3.5–5.1)
Sodium: 141 mmol/L (ref 135–145)
Total Bilirubin: 0.2 mg/dL — ABNORMAL LOW (ref 0.3–1.2)
Total Protein: 8 g/dL (ref 6.5–8.1)

## 2018-06-16 LAB — CBC
HCT: 36.9 % (ref 36.0–46.0)
Hemoglobin: 12.2 g/dL (ref 12.0–15.0)
MCH: 33.4 pg (ref 26.0–34.0)
MCHC: 33.1 g/dL (ref 30.0–36.0)
MCV: 101.1 fL — ABNORMAL HIGH (ref 78.0–100.0)
Platelets: 168 10*3/uL (ref 150–400)
RBC: 3.65 MIL/uL — ABNORMAL LOW (ref 3.87–5.11)
RDW: 13.4 % (ref 11.5–15.5)
WBC: 3 10*3/uL — ABNORMAL LOW (ref 4.0–10.5)

## 2018-06-16 LAB — ABO/RH: ABO/RH(D): A POS

## 2018-06-16 LAB — APTT: aPTT: 27 seconds (ref 24–36)

## 2018-06-16 LAB — PROTIME-INR
INR: 0.98
Prothrombin Time: 12.9 seconds (ref 11.4–15.2)

## 2018-06-16 LAB — SURGICAL PCR SCREEN
MRSA, PCR: NEGATIVE
Staphylococcus aureus: POSITIVE — AB

## 2018-06-16 NOTE — Patient Instructions (Signed)
Laura Jones  06/16/2018   Your procedure is scheduled on: 06/18/2018   [facility]  Report to admitting at   0615 AM    Call this number if you have problems the morning of surgery [phone]   Remember: Do not eat food or drink liquids :After Midnight.     Take these medicines the morning of surgery with A SIP OF WATER: Celexa, Dexilant, Genvoya, Claritin if needed, Myrbetriq, Zantac                                 You may not have any metal on your body including hair pins and              piercings  Do not wear jewelry, make-up, lotions, powders or perfumes, deodorant             Do not wear nail polish.  Do not shave  48 hours prior to surgery.                Do not bring valuables to the hospital. [facility] IS NOT             RESPONSIBLE   FOR VALUABLES.  Contacts, dentures or bridgework may not be worn into surgery.  Leave suitcase in the car. After surgery it may be brought to your room.                      Please read over the following fact sheets you were given: _____________________________________________________________________             [facility] - Preparing for Surgery Before surgery, you can play an important role.  Because skin is not sterile, your skin needs to be as free of germs as possible.  You can reduce the number of germs on your skin by washing with CHG (chlorahexidine gluconate) soap before surgery.  CHG is an antiseptic cleaner which kills germs and bonds with the skin to continue killing germs even after washing. Please DO NOT use if you have an allergy to CHG or antibacterial soaps.  If your skin becomes reddened/irritated stop using the CHG and inform your nurse when you arrive at Short Stay. Do not shave (including legs and underarms) for at least 48 hours prior to the first CHG shower.  You may shave your face/neck. Please follow these instructions carefully:  1.  Shower with CHG  Soap the night before surgery and the  morning of Surgery.  2.  If you choose to wash your hair, wash your hair first as usual with your  normal  shampoo.  3.  After you shampoo, rinse your hair and body thoroughly to remove the  shampoo.                           4.  Use CHG as you would any other liquid soap.  You can apply chg directly  to the skin and wash                       Gently with a scrungie or clean washcloth.  5.  Apply the CHG Soap to your body ONLY FROM THE NECK DOWN.   Do not use on face/ open  Wound or open sores. Avoid contact with eyes, ears mouth and genitals (private parts).                       Wash face,  Genitals (private parts) with your normal soap.             6.  Wash thoroughly, paying special attention to the area where your surgery  will be performed.  7.  Thoroughly rinse your body with warm water from the neck down.  8.  DO NOT shower/wash with your normal soap after using and rinsing off  the CHG Soap.                9.  Pat yourself dry with a clean towel.            10.  Wear clean pajamas.            11.  Place clean sheets on your bed the night of your first shower and do not  sleep with pets. Day of Surgery : Do not apply any lotions/deodorants the morning of surgery.  Please wear clean clothes to the hospital/surgery center.  FAILURE TO FOLLOW THESE INSTRUCTIONS MAY RESULT IN THE CANCELLATION OF YOUR SURGERY PATIENT SIGNATURE_________________________________  NURSE SIGNATURE__________________________________  ________________________________________________________________________  WHAT IS A BLOOD TRANSFUSION? Blood Transfusion Information  A transfusion is the replacement of blood or some of its parts. Blood is made up of multiple cells which provide different functions.  Red blood cells carry oxygen and are used for blood loss replacement.  White blood cells fight against infection.  Platelets control  bleeding.  Plasma helps clot blood.  Other blood products are available for specialized needs, such as hemophilia or other clotting disorders. BEFORE THE TRANSFUSION  Who gives blood for transfusions?   Healthy volunteers who are fully evaluated to make sure their blood is safe. This is blood bank blood. Transfusion therapy is the safest it has ever been in the practice of medicine. Before blood is taken from a donor, a complete history is taken to make sure that person has no history of diseases nor engages in risky social behavior (examples are intravenous drug use or sexual activity with multiple partners). The donor's travel history is screened to minimize risk of transmitting infections, such as malaria. The donated blood is tested for signs of infectious diseases, such as HIV and hepatitis. The blood is then tested to be sure it is compatible with you in order to minimize the chance of a transfusion reaction. If you or a relative donates blood, this is often done in anticipation of surgery and is not appropriate for emergency situations. It takes many days to process the donated blood. RISKS AND COMPLICATIONS Although transfusion therapy is very safe and saves many lives, the main dangers of transfusion include:   Getting an infectious disease.  Developing a transfusion reaction. This is an allergic reaction to something in the blood you were given. Every precaution is taken to prevent this. The decision to have a blood transfusion has been considered carefully by your caregiver before blood is given. Blood is not given unless the benefits outweigh the risks. AFTER THE TRANSFUSION  Right after receiving a blood transfusion, you will usually feel much better and more energetic. This is especially true if your red blood cells have gotten low (anemic). The transfusion raises the level of the red blood cells which carry oxygen, and this usually causes an energy increase.  The  nurse  administering the transfusion will monitor you carefully for complications. HOME CARE INSTRUCTIONS  No special instructions are needed after a transfusion. You may find your energy is better. Speak with your caregiver about any limitations on activity for underlying diseases you may have. SEEK MEDICAL CARE IF:   Your condition is not improving after your transfusion.  You develop redness or irritation at the intravenous (IV) site. SEEK IMMEDIATE MEDICAL CARE IF:  Any of the following symptoms occur over the next 12 hours:  Shaking chills.  You have a temperature by mouth above 102 F (38.9 C), not controlled by medicine.  Chest, back, or muscle pain.  People around you feel you are not acting correctly or are confused.  Shortness of breath or difficulty breathing.  Dizziness and fainting.  You get a rash or develop hives.  You have a decrease in urine output.  Your urine turns a dark color or changes to pink, red, or brown. Any of the following symptoms occur over the next 10 days:  You have a temperature by mouth above 102 F (38.9 C), not controlled by medicine.  Shortness of breath.  Weakness after normal activity.  The white part of the eye turns yellow (jaundice).  You have a decrease in the amount of urine or are urinating less often.  Your urine turns a dark color or changes to pink, red, or brown. Document Released: 12/07/2000 Document Revised: 03/03/2012 Document Reviewed: 07/26/2008 ExitCare Patient Information 2014 [place].  _______________________________________________________________________  Incentive Spirometer  An incentive spirometer is a tool that can help keep your lungs clear and active. This tool measures how well you are filling your lungs with each breath. Taking long deep breaths may help reverse or decrease the chance of developing breathing (pulmonary) problems (especially infection) following:  A long period of time when you are  unable to move or be active. BEFORE THE PROCEDURE   If the spirometer includes an indicator to show your best effort, your nurse or respiratory therapist will set it to a desired goal.  If possible, sit up straight or lean slightly forward. Try not to slouch.  Hold the incentive spirometer in an upright position. INSTRUCTIONS FOR USE  1. Sit on the edge of your bed if possible, or sit up as far as you can in bed or on a chair. 2. Hold the incentive spirometer in an upright position. 3. Breathe out normally. 4. Place the mouthpiece in your mouth and seal your lips tightly around it. 5. Breathe in slowly and as deeply as possible, raising the piston or the ball toward the top of the column. 6. Hold your breath for 3-5 seconds or for as long as possible. Allow the piston or ball to fall to the bottom of the column. 7. Remove the mouthpiece from your mouth and breathe out normally. 8. Rest for a few seconds and repeat Steps 1 through 7 at least 10 times every 1-2 hours when you are awake. Take your time and take a few normal breaths between deep breaths. 9. The spirometer may include an indicator to show your best effort. Use the indicator as a goal to work toward during each repetition. 10. After each set of 10 deep breaths, practice coughing to be sure your lungs are clear. If you have an incision (the cut made at the time of surgery), support your incision when coughing by placing a pillow or rolled up towels firmly against it. Once you are able to get  out of bed, walk around indoors and cough well. You may stop using the incentive spirometer when instructed by your caregiver.  RISKS AND COMPLICATIONS  Take your time so you do not get dizzy or light-headed.  If you are in pain, you may need to take or ask for pain medication before doing incentive spirometry. It is harder to take a deep breath if you are having pain. AFTER USE  Rest and breathe slowly and easily.  It can be helpful to  keep track of a log of your progress. Your caregiver can provide you with a simple table to help with this. If you are using the spirometer at home, follow these instructions: [place] IF:   You are having difficultly using the spirometer.  You have trouble using the spirometer as often as instructed.  Your pain medication is not giving enough relief while using the spirometer.  You develop fever of 100.5 F (38.1 C) or higher. SEEK IMMEDIATE MEDICAL CARE IF:   You cough up bloody sputum that had not been present before.  You develop fever of 102 F (38.9 C) or greater.  You develop worsening pain at or near the incision site. MAKE SURE YOU:   Understand these instructions.  Will watch your condition.  Will get help right away if you are not doing well or get worse. Document Released: 04/22/2007 Document Revised: 03/03/2012 Document Reviewed: 06/23/2007 [facility] Patient Information 2014 [place], [place].   ________________________________________________________________________

## 2018-06-17 ENCOUNTER — Other Ambulatory Visit: Payer: Self-pay | Admitting: Infectious Diseases

## 2018-06-17 DIAGNOSIS — B20 Human immunodeficiency virus [HIV] disease: Secondary | ICD-10-CM

## 2018-06-17 MED ORDER — TRANEXAMIC ACID 1000 MG/10ML IV SOLN
1000.0000 mg | INTRAVENOUS | Status: AC
  Administered 2018-06-18: 1000 mg via INTRAVENOUS
  Filled 2018-06-17: qty 1100

## 2018-06-17 MED ORDER — BUPIVACAINE LIPOSOME 1.3 % IJ SUSP
20.0000 mL | Freq: Once | INTRAMUSCULAR | Status: DC
  Filled 2018-06-17: qty 20

## 2018-06-18 ENCOUNTER — Inpatient Hospital Stay (HOSPITAL_COMMUNITY): Payer: Medicare Other

## 2018-06-18 ENCOUNTER — Inpatient Hospital Stay (HOSPITAL_COMMUNITY): Payer: Medicare Other | Admitting: Certified Registered"

## 2018-06-18 ENCOUNTER — Encounter (HOSPITAL_COMMUNITY): Payer: Self-pay | Admitting: Certified Registered"

## 2018-06-18 ENCOUNTER — Other Ambulatory Visit: Payer: Self-pay

## 2018-06-18 ENCOUNTER — Encounter (HOSPITAL_COMMUNITY)
Admission: RE | Disposition: A | Discharge status: Home Health | Payer: Self-pay | Source: Home / Self Care | Attending: Orthopedic Surgery

## 2018-06-18 ENCOUNTER — Inpatient Hospital Stay (HOSPITAL_COMMUNITY)
Admission: RE | Admit: 2018-06-18 | Discharge: 2018-06-20 | DRG: 470 | Disposition: A | Discharge status: Home Health | Payer: Medicare Other | Attending: Orthopedic Surgery | Admitting: Orthopedic Surgery

## 2018-06-18 DIAGNOSIS — Z8249 Family history of ischemic heart disease and other diseases of the circulatory system: Secondary | ICD-10-CM

## 2018-06-18 DIAGNOSIS — Z90710 Acquired absence of both cervix and uterus: Secondary | ICD-10-CM

## 2018-06-18 DIAGNOSIS — Z881 Allergy status to other antibiotic agents status: Secondary | ICD-10-CM | POA: Diagnosis not present

## 2018-06-18 DIAGNOSIS — Z8262 Family history of osteoporosis: Secondary | ICD-10-CM | POA: Diagnosis not present

## 2018-06-18 DIAGNOSIS — E7800 Pure hypercholesterolemia, unspecified: Secondary | ICD-10-CM | POA: Diagnosis not present

## 2018-06-18 DIAGNOSIS — Z471 Aftercare following joint replacement surgery: Secondary | ICD-10-CM | POA: Diagnosis not present

## 2018-06-18 DIAGNOSIS — Z6834 Body mass index (BMI) 34.0-34.9, adult: Secondary | ICD-10-CM

## 2018-06-18 DIAGNOSIS — Z8619 Personal history of other infectious and parasitic diseases: Secondary | ICD-10-CM

## 2018-06-18 DIAGNOSIS — F329 Major depressive disorder, single episode, unspecified: Secondary | ICD-10-CM | POA: Diagnosis present

## 2018-06-18 DIAGNOSIS — G4700 Insomnia, unspecified: Secondary | ICD-10-CM | POA: Diagnosis present

## 2018-06-18 DIAGNOSIS — Z79899 Other long term (current) drug therapy: Secondary | ICD-10-CM

## 2018-06-18 DIAGNOSIS — Z79891 Long term (current) use of opiate analgesic: Secondary | ICD-10-CM

## 2018-06-18 DIAGNOSIS — Z825 Family history of asthma and other chronic lower respiratory diseases: Secondary | ICD-10-CM

## 2018-06-18 DIAGNOSIS — Z96642 Presence of left artificial hip joint: Secondary | ICD-10-CM | POA: Diagnosis not present

## 2018-06-18 DIAGNOSIS — Z96641 Presence of right artificial hip joint: Secondary | ICD-10-CM | POA: Diagnosis present

## 2018-06-18 DIAGNOSIS — M1612 Unilateral primary osteoarthritis, left hip: Secondary | ICD-10-CM | POA: Diagnosis not present

## 2018-06-18 DIAGNOSIS — M87052 Idiopathic aseptic necrosis of left femur: Secondary | ICD-10-CM | POA: Diagnosis not present

## 2018-06-18 DIAGNOSIS — Z888 Allergy status to other drugs, medicaments and biological substances status: Secondary | ICD-10-CM

## 2018-06-18 DIAGNOSIS — M8788 Other osteonecrosis, other site: Principal | ICD-10-CM | POA: Diagnosis present

## 2018-06-18 DIAGNOSIS — M169 Osteoarthritis of hip, unspecified: Secondary | ICD-10-CM | POA: Diagnosis not present

## 2018-06-18 DIAGNOSIS — M5126 Other intervertebral disc displacement, lumbar region: Secondary | ICD-10-CM | POA: Diagnosis present

## 2018-06-18 DIAGNOSIS — M4807 Spinal stenosis, lumbosacral region: Secondary | ICD-10-CM | POA: Diagnosis present

## 2018-06-18 DIAGNOSIS — K219 Gastro-esophageal reflux disease without esophagitis: Secondary | ICD-10-CM | POA: Diagnosis present

## 2018-06-18 DIAGNOSIS — Z21 Asymptomatic human immunodeficiency virus [HIV] infection status: Secondary | ICD-10-CM | POA: Diagnosis present

## 2018-06-18 DIAGNOSIS — Z87442 Personal history of urinary calculi: Secondary | ICD-10-CM

## 2018-06-18 DIAGNOSIS — E669 Obesity, unspecified: Secondary | ICD-10-CM | POA: Diagnosis present

## 2018-06-18 DIAGNOSIS — M25752 Osteophyte, left hip: Secondary | ICD-10-CM | POA: Diagnosis present

## 2018-06-18 DIAGNOSIS — D649 Anemia, unspecified: Secondary | ICD-10-CM | POA: Diagnosis not present

## 2018-06-18 DIAGNOSIS — Z882 Allergy status to sulfonamides status: Secondary | ICD-10-CM | POA: Diagnosis not present

## 2018-06-18 DIAGNOSIS — B20 Human immunodeficiency virus [HIV] disease: Secondary | ICD-10-CM

## 2018-06-18 DIAGNOSIS — F4310 Post-traumatic stress disorder, unspecified: Secondary | ICD-10-CM | POA: Diagnosis present

## 2018-06-18 DIAGNOSIS — Z96649 Presence of unspecified artificial hip joint: Secondary | ICD-10-CM

## 2018-06-18 HISTORY — PX: TOTAL HIP ARTHROPLASTY: SHX124

## 2018-06-18 LAB — TYPE AND SCREEN
ABO/RH(D): A POS
Antibody Screen: NEGATIVE

## 2018-06-18 SURGERY — ARTHROPLASTY, HIP, TOTAL, ANTERIOR APPROACH
Anesthesia: Spinal | Site: Hip | Laterality: Left

## 2018-06-18 MED ORDER — HYDROCODONE-ACETAMINOPHEN 7.5-325 MG PO TABS
1.0000 | ORAL_TABLET | ORAL | Status: DC | PRN
  Administered 2018-06-18 (×3): 1 via ORAL
  Administered 2018-06-19 – 2018-06-20 (×5): 2 via ORAL
  Filled 2018-06-18 (×2): qty 2
  Filled 2018-06-18: qty 1
  Filled 2018-06-18 (×3): qty 2
  Filled 2018-06-18 (×2): qty 1
  Filled 2018-06-18: qty 2

## 2018-06-18 MED ORDER — STERILE WATER FOR IRRIGATION IR SOLN
Status: DC | PRN
  Administered 2018-06-18: 2000 mL

## 2018-06-18 MED ORDER — HYDROCODONE-ACETAMINOPHEN 5-325 MG PO TABS
ORAL_TABLET | ORAL | Status: AC
  Filled 2018-06-18: qty 1

## 2018-06-18 MED ORDER — HYDROMORPHONE HCL 1 MG/ML IJ SOLN
INTRAMUSCULAR | Status: AC
  Filled 2018-06-18: qty 2

## 2018-06-18 MED ORDER — ATORVASTATIN CALCIUM 10 MG PO TABS
10.0000 mg | ORAL_TABLET | Freq: Every day | ORAL | Status: DC
  Administered 2018-06-18 – 2018-06-19 (×2): 10 mg via ORAL
  Filled 2018-06-18 (×2): qty 1

## 2018-06-18 MED ORDER — BUSPIRONE HCL 5 MG PO TABS
15.0000 mg | ORAL_TABLET | Freq: Two times a day (BID) | ORAL | Status: DC
  Administered 2018-06-18 – 2018-06-20 (×4): 15 mg via ORAL
  Filled 2018-06-18 (×5): qty 1

## 2018-06-18 MED ORDER — HYDROXYZINE HCL 50 MG PO TABS
50.0000 mg | ORAL_TABLET | Freq: Three times a day (TID) | ORAL | Status: DC | PRN
  Filled 2018-06-18: qty 1

## 2018-06-18 MED ORDER — DEXAMETHASONE SODIUM PHOSPHATE 10 MG/ML IJ SOLN
INTRAMUSCULAR | Status: AC
  Filled 2018-06-18: qty 1

## 2018-06-18 MED ORDER — ONDANSETRON HCL 4 MG/2ML IJ SOLN
INTRAMUSCULAR | Status: AC
  Filled 2018-06-18: qty 2

## 2018-06-18 MED ORDER — MUPIROCIN 2 % EX OINT
1.0000 "application " | TOPICAL_OINTMENT | Freq: Two times a day (BID) | CUTANEOUS | Status: DC
  Administered 2018-06-18 – 2018-06-20 (×4): 1 via NASAL
  Filled 2018-06-18: qty 22

## 2018-06-18 MED ORDER — PHENYLEPHRINE 40 MCG/ML (10ML) SYRINGE FOR IV PUSH (FOR BLOOD PRESSURE SUPPORT)
PREFILLED_SYRINGE | INTRAVENOUS | Status: DC | PRN
  Administered 2018-06-18 (×5): 80 ug via INTRAVENOUS

## 2018-06-18 MED ORDER — ACETAMINOPHEN 325 MG PO TABS: 325.0000 mg | ORAL_TABLET | Freq: Four times a day (QID) | ORAL | Status: DC | PRN

## 2018-06-18 MED ORDER — BUPIVACAINE-EPINEPHRINE (PF) 0.25% -1:200000 IJ SOLN
INTRAMUSCULAR | Status: DC | PRN
  Administered 2018-06-18: 30 mL

## 2018-06-18 MED ORDER — MIDAZOLAM HCL 2 MG/2ML IJ SOLN
INTRAMUSCULAR | Status: AC
  Filled 2018-06-18: qty 2

## 2018-06-18 MED ORDER — DICYCLOMINE HCL 20 MG PO TABS
40.0000 mg | ORAL_TABLET | Freq: Two times a day (BID) | ORAL | Status: DC
  Administered 2018-06-18 – 2018-06-20 (×4): 40 mg via ORAL
  Filled 2018-06-18 (×4): qty 2

## 2018-06-18 MED ORDER — ACETAMINOPHEN 10 MG/ML IV SOLN
1000.0000 mg | Freq: Four times a day (QID) | INTRAVENOUS | Status: DC
  Administered 2018-06-18: 1000 mg via INTRAVENOUS
  Filled 2018-06-18: qty 100

## 2018-06-18 MED ORDER — LACTATED RINGERS IV SOLN
INTRAVENOUS | Status: DC
  Administered 2018-06-18 (×2): via INTRAVENOUS

## 2018-06-18 MED ORDER — TRAZODONE HCL 100 MG PO TABS
100.0000 mg | ORAL_TABLET | Freq: Every day | ORAL | Status: DC
  Administered 2018-06-18 – 2018-06-19 (×2): 200 mg via ORAL
  Filled 2018-06-18 (×2): qty 2

## 2018-06-18 MED ORDER — DOCUSATE SODIUM 100 MG PO CAPS
100.0000 mg | ORAL_CAPSULE | Freq: Two times a day (BID) | ORAL | Status: DC
  Administered 2018-06-18 – 2018-06-20 (×4): 100 mg via ORAL
  Filled 2018-06-18 (×4): qty 1

## 2018-06-18 MED ORDER — LOPERAMIDE HCL 2 MG PO CAPS: 2.0000 mg | ORAL_CAPSULE | ORAL | Status: DC | PRN

## 2018-06-18 MED ORDER — BUPROPION HCL ER (SR) 150 MG PO TB12
150.0000 mg | ORAL_TABLET | Freq: Two times a day (BID) | ORAL | Status: DC
  Administered 2018-06-18 – 2018-06-20 (×4): 150 mg via ORAL
  Filled 2018-06-18 (×4): qty 1

## 2018-06-18 MED ORDER — BUPROPION HCL 100 MG PO TABS
100.0000 mg | ORAL_TABLET | Freq: Every day | ORAL | Status: DC
  Administered 2018-06-18 – 2018-06-19 (×2): 100 mg via ORAL
  Filled 2018-06-18 (×2): qty 1

## 2018-06-18 MED ORDER — 0.9 % SODIUM CHLORIDE (POUR BTL) OPTIME
TOPICAL | Status: DC | PRN
  Administered 2018-06-18: 1000 mL

## 2018-06-18 MED ORDER — MENTHOL 3 MG MT LOZG: 1.0000 | LOZENGE | OROMUCOSAL | Status: DC | PRN

## 2018-06-18 MED ORDER — DEXTROSE 5 % IV SOLN
INTRAVENOUS | Status: DC | PRN
  Administered 2018-06-18: 25 ug/min via INTRAVENOUS

## 2018-06-18 MED ORDER — METHOCARBAMOL 1000 MG/10ML IJ SOLN
500.0000 mg | Freq: Four times a day (QID) | INTRAVENOUS | Status: DC | PRN
  Administered 2018-06-18: 500 mg via INTRAVENOUS
  Filled 2018-06-18: qty 550

## 2018-06-18 MED ORDER — HYDROMORPHONE HCL 1 MG/ML IJ SOLN
0.2500 mg | INTRAMUSCULAR | Status: DC | PRN
Start: 2018-06-18 — End: 2018-06-18
  Administered 2018-06-18 (×2): 0.5 mg via INTRAVENOUS

## 2018-06-18 MED ORDER — MIDAZOLAM HCL 5 MG/5ML IJ SOLN
INTRAMUSCULAR | Status: DC | PRN
  Administered 2018-06-18: 2 mg via INTRAVENOUS

## 2018-06-18 MED ORDER — BUPIVACAINE IN DEXTROSE 0.75-8.25 % IT SOLN
INTRATHECAL | Status: DC | PRN
  Administered 2018-06-18: 2 mL via INTRATHECAL

## 2018-06-18 MED ORDER — PHENYLEPHRINE 40 MCG/ML (10ML) SYRINGE FOR IV PUSH (FOR BLOOD PRESSURE SUPPORT)
PREFILLED_SYRINGE | INTRAVENOUS | Status: AC
  Filled 2018-06-18: qty 10

## 2018-06-18 MED ORDER — ACETAMINOPHEN 500 MG PO TABS
500.0000 mg | ORAL_TABLET | Freq: Four times a day (QID) | ORAL | Status: AC
  Administered 2018-06-18 (×3): 500 mg via ORAL
  Filled 2018-06-18 (×4): qty 1

## 2018-06-18 MED ORDER — CHLORHEXIDINE GLUCONATE CLOTH 2 % EX PADS
6.0000 | MEDICATED_PAD | Freq: Every day | CUTANEOUS | Status: DC
  Administered 2018-06-18: 6 via TOPICAL

## 2018-06-18 MED ORDER — OXYCODONE HCL 5 MG PO TABS
5.0000 mg | ORAL_TABLET | Freq: Once | ORAL | Status: DC | PRN
Start: 2018-06-18 — End: 2018-06-18

## 2018-06-18 MED ORDER — SODIUM CHLORIDE 0.9 % IV SOLN
INTRAVENOUS | Status: DC
  Administered 2018-06-18: 12:00:00 via INTRAVENOUS

## 2018-06-18 MED ORDER — ELVITEG-COBIC-EMTRICIT-TENOFAF 150-150-200-10 MG PO TABS
1.0000 | ORAL_TABLET | Freq: Every day | ORAL | Status: DC
  Administered 2018-06-18 – 2018-06-19 (×2): 1 via ORAL
  Filled 2018-06-18 (×2): qty 1

## 2018-06-18 MED ORDER — FLEET ENEMA 7-19 GM/118ML RE ENEM: 1.0000 | ENEMA | Freq: Once | RECTAL | Status: DC | PRN

## 2018-06-18 MED ORDER — ONDANSETRON HCL 4 MG/2ML IJ SOLN
4.0000 mg | Freq: Four times a day (QID) | INTRAMUSCULAR | Status: DC | PRN
  Administered 2018-06-18: 4 mg via INTRAVENOUS
  Filled 2018-06-18: qty 2

## 2018-06-18 MED ORDER — ONDANSETRON HCL 4 MG PO TABS
4.0000 mg | ORAL_TABLET | Freq: Four times a day (QID) | ORAL | Status: DC | PRN
  Administered 2018-06-19 (×2): 4 mg via ORAL
  Filled 2018-06-18 (×2): qty 1

## 2018-06-18 MED ORDER — DEXAMETHASONE SODIUM PHOSPHATE 10 MG/ML IJ SOLN
8.0000 mg | Freq: Once | INTRAMUSCULAR | Status: AC
  Administered 2018-06-18: 10 mg via INTRAVENOUS

## 2018-06-18 MED ORDER — CEFAZOLIN SODIUM-DEXTROSE 2-4 GM/100ML-% IV SOLN
2.0000 g | INTRAVENOUS | Status: AC
  Administered 2018-06-18: 2 g via INTRAVENOUS
  Filled 2018-06-18: qty 100

## 2018-06-18 MED ORDER — PROPOFOL 10 MG/ML IV BOLUS
INTRAVENOUS | Status: AC
  Filled 2018-06-18: qty 20

## 2018-06-18 MED ORDER — FENTANYL CITRATE (PF) 100 MCG/2ML IJ SOLN
INTRAMUSCULAR | Status: DC | PRN
  Administered 2018-06-18: 100 ug via INTRAVENOUS

## 2018-06-18 MED ORDER — PROPOFOL 10 MG/ML IV BOLUS
INTRAVENOUS | Status: AC
  Filled 2018-06-18: qty 40

## 2018-06-18 MED ORDER — CITALOPRAM HYDROBROMIDE 40 MG PO TABS
40.0000 mg | ORAL_TABLET | Freq: Every day | ORAL | Status: DC
  Administered 2018-06-19 – 2018-06-20 (×2): 40 mg via ORAL
  Filled 2018-06-18: qty 1
  Filled 2018-06-18: qty 2
  Filled 2018-06-18: qty 1
  Filled 2018-06-18: qty 2

## 2018-06-18 MED ORDER — BISACODYL 10 MG RE SUPP: 10.0000 mg | Freq: Every day | RECTAL | Status: DC | PRN

## 2018-06-18 MED ORDER — PROPOFOL 500 MG/50ML IV EMUL
INTRAVENOUS | Status: DC | PRN
  Administered 2018-06-18: 100 ug/kg/min via INTRAVENOUS

## 2018-06-18 MED ORDER — FENTANYL CITRATE (PF) 100 MCG/2ML IJ SOLN
INTRAMUSCULAR | Status: AC
  Filled 2018-06-18: qty 2

## 2018-06-18 MED ORDER — HYDROCODONE-ACETAMINOPHEN 5-325 MG PO TABS
1.0000 | ORAL_TABLET | ORAL | Status: DC | PRN
  Administered 2018-06-18 (×2): 1 via ORAL

## 2018-06-18 MED ORDER — DIPHENHYDRAMINE HCL 12.5 MG/5ML PO ELIX: 12.5000 mg | ORAL_SOLUTION | ORAL | Status: DC | PRN

## 2018-06-18 MED ORDER — POLYETHYLENE GLYCOL 3350 17 G PO PACK: 17.0000 g | PACK | Freq: Every day | ORAL | Status: DC | PRN

## 2018-06-18 MED ORDER — ASPIRIN EC 325 MG PO TBEC
325.0000 mg | DELAYED_RELEASE_TABLET | Freq: Two times a day (BID) | ORAL | Status: DC
  Administered 2018-06-19 – 2018-06-20 (×3): 325 mg via ORAL
  Filled 2018-06-18 (×3): qty 1

## 2018-06-18 MED ORDER — MORPHINE SULFATE (PF) 4 MG/ML IV SOLN
0.5000 mg | INTRAVENOUS | Status: DC | PRN
  Administered 2018-06-18 – 2018-06-19 (×2): 1 mg via INTRAVENOUS
  Filled 2018-06-18 (×2): qty 1

## 2018-06-18 MED ORDER — PHENOL 1.4 % MT LIQD: 1.0000 | OROMUCOSAL | Status: DC | PRN

## 2018-06-18 MED ORDER — CEFAZOLIN SODIUM-DEXTROSE 2-4 GM/100ML-% IV SOLN
2.0000 g | Freq: Four times a day (QID) | INTRAVENOUS | Status: AC
  Administered 2018-06-18 (×2): 2 g via INTRAVENOUS
  Filled 2018-06-18 (×2): qty 100

## 2018-06-18 MED ORDER — OXYCODONE HCL 5 MG/5ML PO SOLN
5.0000 mg | Freq: Once | ORAL | Status: DC | PRN
  Filled 2018-06-18: qty 5

## 2018-06-18 MED ORDER — CHLORHEXIDINE GLUCONATE 4 % EX LIQD: 60.0000 mL | Freq: Once | CUTANEOUS | Status: DC

## 2018-06-18 MED ORDER — FAMOTIDINE 20 MG PO TABS
20.0000 mg | ORAL_TABLET | Freq: Two times a day (BID) | ORAL | Status: DC
  Administered 2018-06-18 – 2018-06-20 (×4): 20 mg via ORAL
  Filled 2018-06-18 (×4): qty 1

## 2018-06-18 MED ORDER — BUPIVACAINE-EPINEPHRINE (PF) 0.25% -1:200000 IJ SOLN
INTRAMUSCULAR | Status: AC
  Filled 2018-06-18: qty 30

## 2018-06-18 MED ORDER — DEXAMETHASONE SODIUM PHOSPHATE 10 MG/ML IJ SOLN
10.0000 mg | Freq: Once | INTRAMUSCULAR | Status: AC
  Administered 2018-06-19: 10 mg via INTRAVENOUS
  Filled 2018-06-18: qty 1

## 2018-06-18 MED ORDER — PANTOPRAZOLE SODIUM 40 MG PO TBEC
80.0000 mg | DELAYED_RELEASE_TABLET | Freq: Every day | ORAL | Status: DC
  Administered 2018-06-19 – 2018-06-20 (×2): 80 mg via ORAL
  Filled 2018-06-18 (×2): qty 2

## 2018-06-18 MED ORDER — METHOCARBAMOL 500 MG PO TABS
500.0000 mg | ORAL_TABLET | Freq: Four times a day (QID) | ORAL | Status: DC | PRN
  Administered 2018-06-18 – 2018-06-19 (×3): 500 mg via ORAL
  Filled 2018-06-18 (×4): qty 1

## 2018-06-18 MED ORDER — ONDANSETRON HCL 4 MG PO TABS: 4.0000 mg | ORAL_TABLET | Freq: Three times a day (TID) | ORAL | Status: DC | PRN

## 2018-06-18 SURGICAL SUPPLY — 39 items
BAG DECANTER FOR FLEXI CONT (MISCELLANEOUS) ×2 IMPLANT
BAG ZIPLOCK 12X15 (MISCELLANEOUS) ×2 IMPLANT
BLADE EXTENDED COATED 6.5IN (ELECTRODE) ×2 IMPLANT
BLADE SAG 18X100X1.27 (BLADE) ×2 IMPLANT
CAPT HIP TOTAL 2 ×2 IMPLANT
CLOTH BEACON ORANGE TIMEOUT ST (SAFETY) IMPLANT
COVER PERINEAL POST (MISCELLANEOUS) ×2 IMPLANT
COVER SURGICAL LIGHT HANDLE (MISCELLANEOUS) ×2 IMPLANT
DECANTER SPIKE VIAL GLASS SM (MISCELLANEOUS) ×2 IMPLANT
DRAPE STERI IOBAN 125X83 (DRAPES) ×2 IMPLANT
DRAPE U-SHAPE 47X51 STRL (DRAPES) ×4 IMPLANT
DRSG ADAPTIC 3X8 NADH LF (GAUZE/BANDAGES/DRESSINGS) ×2 IMPLANT
DRSG MEPILEX BORDER 4X4 (GAUZE/BANDAGES/DRESSINGS) ×2 IMPLANT
DRSG MEPILEX BORDER 4X8 (GAUZE/BANDAGES/DRESSINGS) ×2 IMPLANT
DURAPREP 26ML APPLICATOR (WOUND CARE) ×2 IMPLANT
ELECT REM PT RETURN 15FT ADLT (MISCELLANEOUS) ×2 IMPLANT
EVACUATOR 1/8 PVC DRAIN (DRAIN) ×2 IMPLANT
GLOVE BIO SURGEON STRL SZ 6.5 (GLOVE) IMPLANT
GLOVE BIO SURGEON STRL SZ7 (GLOVE) ×2 IMPLANT
GLOVE BIO SURGEON STRL SZ8 (GLOVE) ×2 IMPLANT
GLOVE BIOGEL PI IND STRL 7.0 (GLOVE) ×1 IMPLANT
GLOVE BIOGEL PI IND STRL 8 (GLOVE) ×1 IMPLANT
GLOVE BIOGEL PI INDICATOR 7.0 (GLOVE) ×1
GLOVE BIOGEL PI INDICATOR 8 (GLOVE) ×1
GOWN STRL REUS W/TWL LRG LVL3 (GOWN DISPOSABLE) ×2 IMPLANT
GOWN STRL REUS W/TWL XL LVL3 (GOWN DISPOSABLE) ×2 IMPLANT
HOOD PEEL AWAY FLYTE STAYCOOL (MISCELLANEOUS) ×6 IMPLANT
PACK ANTERIOR HIP CUSTOM (KITS) ×2 IMPLANT
STRIP CLOSURE SKIN 1/2X4 (GAUZE/BANDAGES/DRESSINGS) ×4 IMPLANT
SUT ETHIBOND NAB CT1 #1 30IN (SUTURE) ×2 IMPLANT
SUT MNCRL AB 4-0 PS2 18 (SUTURE) ×2 IMPLANT
SUT STRATAFIX 0 PDS 27 VIOLET (SUTURE) ×4
SUT VIC AB 2-0 CT1 27 (SUTURE) ×4
SUT VIC AB 2-0 CT1 TAPERPNT 27 (SUTURE) ×2 IMPLANT
SUTURE STRATFX 0 PDS 27 VIOLET (SUTURE) ×2 IMPLANT
SYR 50ML LL SCALE MARK (SYRINGE) IMPLANT
TRAY FOLEY CATH 14FRSI W/METER (CATHETERS) ×2 IMPLANT
TRAY FOLEY MTR SLVR 16FR STAT (SET/KITS/TRAYS/PACK) IMPLANT
YANKAUER SUCT BULB TIP 10FT TU (MISCELLANEOUS) ×2 IMPLANT

## 2018-06-18 NOTE — Anesthesia Postprocedure Evaluation (Signed)
Anesthesia Post Note  Patient: Atiyana M Wheeland  Procedure(s) Performed: LEFT TOTAL HIP ARTHROPLASTY ANTERIOR APPROACH (Left Hip)     Patient location during evaluation: PACU Anesthesia Type: Spinal Level of consciousness: oriented and awake and alert Pain management: pain level controlled Vital Signs Assessment: post-procedure vital signs reviewed and stable Respiratory status: spontaneous breathing and respiratory function stable Cardiovascular status: blood pressure returned to baseline and stable Postop Assessment: no headache, no backache and no apparent nausea or vomiting Anesthetic complications: no    Last Vitals:  Vitals:   06/18/18 1200 06/18/18 1215  BP: 114/87 113/76  Pulse: 88 91  Resp: 14 16  Temp:  36.5 C  SpO2: 98% 99%    Last Pain:  Vitals:   06/18/18 1227  TempSrc:   PainSc: 2                  Lowella CurbWarren Ray John Williamsen

## 2018-06-18 NOTE — Evaluation (Signed)
Physical Therapy Evaluation Patient Details Name: Laura LagerBabette M Jones MRN: [id number] DOB: 1971/06/28 Today's Date: 06/18/2018   History of Present Illness  Pt s/p L THR and with hx of PTSD, HIV, and R THR(14)  Clinical Impression    Pt s/p L THR and presents with decreased L LE strength/ROM and post op pain limiting functional mobility.  Pt should progress to dc home with assist of family.  Follow Up Recommendations Home health PT    Equipment Recommendations  None recommended by PT    Recommendations for Other Services       Precautions / Restrictions Precautions Precautions: Fall Restrictions Weight Bearing Restrictions: No Other Position/Activity Restrictions: WBAT      Mobility  Bed Mobility Overal bed mobility: Needs Assistance Bed Mobility: Supine to Sit     Supine to sit: Min assist     General bed mobility comments: cues for sequence and use of R LE to self assist  Transfers Overall transfer level: Needs assistance Equipment used: Rolling walker (2 wheeled) Transfers: Sit to/from Stand Sit to Stand: Min assist         General transfer comment: cues for LE management and use of UEs to self assist  Ambulation/Gait Ambulation/Gait assistance: Min assist Gait Distance (Feet): 23 Feet Assistive device: Rolling walker (2 wheeled) Gait Pattern/deviations: Step-to pattern;Decreased step length - right;Decreased step length - left;Shuffle;Trunk flexed Gait velocity: decr   General Gait Details: cues for posture, position from RW and initial sequence   Stairs            Wheelchair Mobility    Modified Rankin (Stroke Patients Only)       Balance Overall balance assessment: No apparent balance deficits (not formally assessed)                                           Pertinent Vitals/Pain Pain Assessment: 0-10 Pain Score: 7  Pain Location: L hip Pain Descriptors / Indicators: Aching;Sore Pain Intervention(s): Limited  activity within patient's tolerance;Monitored during session;Premedicated before session;Ice applied    Home Living Family/patient expects to be discharged to:: Private residence Living Arrangements: Parent Available Help at Discharge: Family Type of Home: House Home Access: Ramped entrance     Home Layout: One level Home Equipment: Environmental consultantWalker - 2 wheels;Cane - single point Additional Comments: Mom will be assisting    Prior Function Level of Independence: Independent;Independent with assistive device(s)         Comments: used cane and RW as needed     Hand Dominance        Extremity/Trunk Assessment   Upper Extremity Assessment Upper Extremity Assessment: Overall WFL for tasks assessed    Lower Extremity Assessment Lower Extremity Assessment: LLE deficits/detail       Communication   Communication: No difficulties  Cognition Arousal/Alertness: Awake/alert Behavior During Therapy: WFL for tasks assessed/performed Overall Cognitive Status: Within Functional Limits for tasks assessed                                        General Comments      Exercises Total Joint Exercises Ankle Circles/Pumps: AROM;Both;15 reps;Supine   Assessment/Plan    PT Assessment Patient needs continued PT services  PT Problem List         PT Treatment Interventions  DME instruction;Gait training;Stair training;Functional mobility training;Therapeutic activities;Therapeutic exercise;Patient/family education    PT Goals (Current goals can be found in the Care Plan section)  Acute Rehab PT Goals Patient Stated Goal: Regain IND PT Goal Formulation: With patient Time For Goal Achievement: 06/25/18 Potential to Achieve Goals: Good    Frequency 7X/week   Barriers to discharge        Co-evaluation               AM-PAC PT "6 Clicks" Daily Activity  Outcome Measure Difficulty turning over in bed (including adjusting bedclothes, sheets and blankets)?:  Unable Difficulty moving from lying on back to sitting on the side of the bed? : Unable Difficulty sitting down on and standing up from a chair with arms (e.g., wheelchair, bedside commode, etc,.)?: Unable Help needed moving to and from a bed to chair (including a wheelchair)?: A Little Help needed walking in hospital room?: A Little Help needed climbing 3-5 steps with a railing? : A Little 6 Click Score: 12    End of Session Equipment Utilized During Treatment: Gait belt Activity Tolerance: Patient tolerated treatment well Patient left: in chair;with call bell/phone within reach;with family/visitor present Nurse Communication: Mobility status PT Visit Diagnosis: Difficulty in walking, not elsewhere classified (R26.2);Pain Pain - Right/Left: Left Pain - part of body: Hip    Time: 1516-1540 PT Time Calculation (min) (ACUTE ONLY): 24 min   Charges:   PT Evaluation $PT Eval Low Complexity: 1 Low PT Treatments $Gait Training: 8-22 mins   PT G Codes:        Pg [phone]   Eliseo Withers 06/18/2018, 6:06 PM

## 2018-06-18 NOTE — Plan of Care (Signed)
  Problem: Nutrition: Goal: Adequate nutrition will be maintained Outcome: Progressing   Problem: Elimination: Goal: Will not experience complications related to bowel motility Outcome: Progressing   Problem: Pain Managment: Goal: General experience of comfort will improve Outcome: Progressing   

## 2018-06-18 NOTE — Interval H&P Note (Signed)
History and Physical Interval Note:  06/18/2018 6:32 AM  Laura Jones Mosqueda  has presented today for surgery, with the diagnosis of Osteoarthritis Left hip  The various methods of treatment have been discussed with the patient and family. After consideration of risks, benefits and other options for treatment, the patient has consented to  Procedure(s): LEFT TOTAL HIP ARTHROPLASTY ANTERIOR APPROACH (Left) as a surgical intervention .  The patient's history has been reviewed, patient examined, no change in status, stable for surgery.  I have reviewed the patient's chart and labs.  Questions were answered to the patient's satisfaction.     Homero FellersFrank July Nickson

## 2018-06-18 NOTE — Anesthesia Procedure Notes (Signed)
Spinal  Patient location during procedure: OR End time: 06/18/2018 8:31 AM Staffing Resident/CRNA: Noralyn Pick D, CRNA Performed: anesthesiologist and resident/CRNA  Preanesthetic Checklist Completed: patient identified, site marked, surgical consent, pre-op evaluation, timeout performed, IV checked, risks and benefits discussed and monitors and equipment checked Spinal Block Patient position: sitting Prep: Betadine Patient monitoring: heart rate, continuous pulse ox and blood pressure Approach: midline Location: L2-3 Injection technique: single-shot Needle Needle type: Pencan  Needle gauge: 24 G Needle length: 9 cm Assessment Sensory level: T6 Additional Notes Expiration date of kit checked and confirmed. Patient tolerated procedure well, without complications.

## 2018-06-18 NOTE — Anesthesia Preprocedure Evaluation (Addendum)
Anesthesia Evaluation  Patient identified by MRN, date of birth, ID band Patient awake    Reviewed: Allergy & Precautions, NPO status , Patient's Chart, lab work & pertinent test results  Airway Mallampati: II  TM Distance: >3 FB Neck ROM: Full    Dental no notable dental hx. (+) Chipped, Partial Upper, Poor Dentition, Loose, Dental Advisory Given, Missing   Pulmonary neg pulmonary ROS,    Pulmonary exam normal breath sounds clear to auscultation       Cardiovascular negative cardio ROS Normal cardiovascular exam Rhythm:Regular Rate:Normal     Neuro/Psych  Headaches, Anxiety Depression negative neurological ROS  negative psych ROS   GI/Hepatic negative GI ROS, Neg liver ROS, GERD  ,  Endo/Other  negative endocrine ROS  Renal/GU negative Renal ROS  negative genitourinary   Musculoskeletal negative musculoskeletal ROS (+) Arthritis , Osteoarthritis,    Abdominal (+) + obese,   Peds negative pediatric ROS (+)  Hematology negative hematology ROS (+) HIV,   Anesthesia Other Findings   Reproductive/Obstetrics negative OB ROS                            Anesthesia Physical Anesthesia Plan  ASA: III  Anesthesia Plan: Spinal   Post-op Pain Management:    Induction: Intravenous  PONV Risk Score and Plan: 2 and Ondansetron and Midazolam  Airway Management Planned: Simple Face Mask  Additional Equipment:   Intra-op Plan:   Post-operative Plan:   Informed Consent: I have reviewed the patients History and Physical, chart, labs and discussed the procedure including the risks, benefits and alternatives for the proposed anesthesia with the patient or authorized representative who has indicated his/her understanding and acceptance.   Dental advisory given  Plan Discussed with: CRNA  Anesthesia Plan Comments:         Anesthesia Quick Evaluation

## 2018-06-18 NOTE — Transfer of Care (Signed)
Immediate Anesthesia Transfer of Care Note  Patient: Laura Jones  Procedure(s) Performed: LEFT TOTAL HIP ARTHROPLASTY ANTERIOR APPROACH (Left Hip)  Patient Location: PACU  Anesthesia Type:Regional and Spinal  Level of Consciousness: awake, alert  and oriented  Airway & Oxygen Therapy: Patient Spontanous Breathing and Patient connected to face mask oxygen  Post-op Assessment: Report given to RN and Post -op Vital signs reviewed and stable  Post vital signs: Reviewed and stable  Last Vitals:  Vitals Value Taken Time  BP    Temp    Pulse 90 06/18/2018 10:34 AM  Resp 14 06/18/2018 10:34 AM  SpO2 100 % 06/18/2018 10:34 AM  Vitals shown include unvalidated device data.  Last Pain:  Vitals:   06/18/18 0639  TempSrc:   PainSc: 9          Complications: No apparent anesthesia complications

## 2018-06-18 NOTE — Op Note (Signed)
OPERATIVE REPORT- TOTAL HIP ARTHROPLASTY   PREOPERATIVE DIAGNOSIS: Osteonecrosis of the Left hip.   POSTOPERATIVE DIAGNOSIS: Osteonecrosis of the Left  hip.   PROCEDURE: Left total hip arthroplasty, anterior approach.   SURGEON: Ollen Gross, MD   ASSISTANT: Arther Abbott, PA-C  ANESTHESIA:  Spinal  ESTIMATED BLOOD LOSS:-550 mL    DRAINS: Hemovac x1.   COMPLICATIONS: None   CONDITION: PACU - hemodynamically stable.   BRIEF CLINICAL NOTE: Laura Jones is a 47 y.o. female who has advanced osteonecrosis of their Left  hip with progressively worsening pain and  dysfunction.The patient has failed nonoperative management and presents for  total hip arthroplasty.   PROCEDURE IN DETAIL: After successful administration of spinal  anesthetic, the traction boots for the [facility] bed were placed on both  feet and the patient was placed onto the [facility] bed, boots placed into the leg  holders. The Left hip was then isolated from the perineum with plastic  drapes and prepped and draped in the usual sterile fashion. ASIS and  greater trochanter were marked and a oblique incision was made, starting  at about 1 cm lateral and 2 cm distal to the ASIS and coursing towards  the anterior cortex of the femur. The skin was cut with a 10 blade  through subcutaneous tissue to the level of the fascia overlying the  tensor fascia lata muscle. The fascia was then incised in line with the  incision at the junction of the anterior third and posterior 2/3rd. The  muscle was teased off the fascia and then the interval between the TFL  and the rectus was developed. The Hohmann retractor was then placed at  the top of the femoral neck over the capsule. The vessels overlying the  capsule were cauterized and the fat on top of the capsule was removed.  A Hohmann retractor was then placed anterior underneath the rectus  femoris to give exposure to the entire anterior capsule. A T-shaped  capsulotomy  was performed. The edges were tagged and the femoral head  was identified.       Osteophytes are removed off the superior acetabulum.  The femoral neck was then cut in situ with an oscillating saw. Traction  was then applied to the left lower extremity utilizing the [facility]  traction. The femoral head was then removed. Retractors were placed  around the acetabulum and then circumferential removal of the labrum was  performed. Osteophytes were also removed. Reaming starts at 47 mm to  medialize and  Increased in 2 mm increments to 49 mm. We reamed in  approximately 40 degrees of abduction, 20 degrees anteversion. A 50 mm  pinnacle acetabular shell was then impacted in anatomic position under  fluoroscopic guidance with excellent purchase. We did not need to place  any additional dome screws. A 32 mm neutral + 4 marathon liner was then  placed into the acetabular shell.       The femoral lift was then placed along the lateral aspect of the femur  just distal to the vastus ridge. The leg was  externally rotated and capsule  was stripped off the inferior aspect of the femoral neck down to the  level of the lesser trochanter, this was done with electrocautery. The femur was lifted after this was performed. The  leg was then placed in an extended and adducted position essentially delivering the femur. We also removed the capsule superiorly and the piriformis from the piriformis fossa to gain  excellent exposure of the  proximal femur. Rongeur was used to remove some cancellous bone to get  into the lateral portion of the proximal femur for placement of the  initial starter reamer. The starter broaches was placed  the starter broach  and was shown to go down the center of the canal. Broaching  with the  Corail system was then performed starting at size 8, coursing  Up to size 12. A size 12 had excellent torsional and rotational  and axial stability. The trial high offset neck was then placed  with a 32  + 9 trial head. The hip was then reduced. We confirmed that  the stem was in the canal both on AP and lateral x-rays. It also has excellent sizing. The hip was reduced with outstanding stability through full extension and full external rotation.. AP pelvis was taken and the leg lengths were measured and found to be equal. Hip was then dislocated again and the femoral head and neck removed. The  femoral broach was removed. Size 12 Corail stem with a high offset  neck was then impacted into the femur following native anteversion. Has  excellent purchase in the canal. Excellent torsional and rotational and  axial stability. It is confirmed to be in the canal on AP and lateral  fluoroscopic views. The 32 + 9 ceramic head was placed and the hip  reduced with outstanding stability. Again AP pelvis was taken and it  confirmed that the leg lengths were equal. The wound was then copiously  irrigated with saline solution and the capsule reattached and repaired  with Ethibond suture. 30 ml of .25% Bupivicaine was  injected into the capsule and into the edge of the tensor fascia lata as well as subcutaneous tissue. The fascia overlying the tensor fascia lata was then closed with a running #1 V-Loc. Subcu was closed with interrupted 2-0 Vicryl and subcuticular running 4-0 Monocryl. Incision was cleaned  and dried. Steri-Strips and a bulky sterile dressing applied. Hemovac  drain was hooked to suction and then the patient was awakened and transported to  recovery in stable condition.        Please note that a surgical assistant was a medical necessity for this procedure to perform it in a safe and expeditious manner. Assistant was necessary to provide appropriate retraction of vital neurovascular structures and to prevent femoral fracture and allow for anatomic placement of the prosthesis.  Ollen GrossFrank Jewelene Jones, M.D.

## 2018-06-18 NOTE — Discharge Instructions (Signed)
°Dr. Frank Aluisio °Total Joint Specialist °[facility] °[address] °[place], [place] [address] °[phone] ° °ANTERIOR APPROACH TOTAL HIP REPLACEMENT POSTOPERATIVE DIRECTIONS ° ° °Hip Rehabilitation, Guidelines Following Surgery  °The results of a hip operation are greatly improved after range of motion and muscle strengthening exercises. Follow all safety measures which are given to protect your hip. If any of these exercises cause increased pain or swelling in your joint, decrease the amount until you are comfortable again. Then slowly increase the exercises. Call your caregiver if you have problems or questions.  ° °HOME CARE INSTRUCTIONS  °• Remove items at home which could result in a fall. This includes throw rugs or furniture in walking pathways.  °· ICE to the affected hip every three hours for 30 minutes at a time and then as needed for pain and swelling.  Continue to use ice on the hip for pain and swelling from surgery. You may notice swelling that will progress down to the foot and ankle.  This is normal after surgery.  Elevate the leg when you are not up walking on it.   °· Continue to use the breathing machine which will help keep your temperature down.  It is common for your temperature to cycle up and down following surgery, especially at night when you are not up moving around and exerting yourself.  The breathing machine keeps your lungs expanded and your temperature down. ° °DIET °You may resume your previous home diet once your are discharged from the hospital. ° °DRESSING / WOUND CARE / SHOWERING °You may shower 3 days after surgery, but keep the wounds dry during showering.  You may use an occlusive plastic wrap (Press'n Seal for example), NO SOAKING/SUBMERGING IN THE BATHTUB.  If the bandage gets wet, change with a clean dry gauze.  If the incision gets wet, pat the wound dry with a clean towel. °You may start showering once you are discharged home but do not submerge the  incision under water. Just pat the incision dry and apply a dry gauze dressing on daily. °Change the surgical dressing daily and reapply a dry dressing each time. ° °ACTIVITY °Walk with your walker as instructed. °Use walker as long as suggested by your caregivers. °Avoid periods of inactivity such as sitting longer than an hour when not asleep. This helps prevent blood clots.  °You may resume a sexual relationship in one month or when given the OK by your doctor.  °You may return to work once you are cleared by your doctor.  °Do not drive a car for 6 weeks or until released by you surgeon.  °Do not drive while taking narcotics. ° °WEIGHT BEARING °Weight bearing as tolerated with assist device (walker, cane, etc) as directed, use it as long as suggested by your surgeon or therapist, typically at least 4-6 weeks. ° °POSTOPERATIVE CONSTIPATION PROTOCOL °Constipation - defined medically as fewer than three stools per week and severe constipation as less than one stool per week. ° °One of the most common issues patients have following surgery is constipation.  Even if you have a regular bowel pattern at home, your normal regimen is likely to be disrupted due to multiple reasons following surgery.  Combination of anesthesia, postoperative narcotics, change in appetite and fluid intake all can affect your bowels.  In order to avoid complications following surgery, here are some recommendations in order to help you during your recovery period. ° °Colace (docusate) - Pick up an over-the-counter form   of Colace or another stool softener and take twice a day as long as you are requiring postoperative pain medications.  Take with a full glass of water daily.  If you experience loose stools or diarrhea, hold the colace until you stool forms back up.  If your symptoms do not get better within 1 week or if they get worse, check with your doctor. ° °Dulcolax (bisacodyl) - Pick up over-the-counter and take as directed by the product  packaging as needed to assist with the movement of your bowels.  Take with a full glass of water.  Use this product as needed if not relieved by Colace only.  ° °MiraLax (polyethylene glycol) - Pick up over-the-counter to have on hand.  MiraLax is a solution that will increase the amount of water in your bowels to assist with bowel movements.  Take as directed and can mix with a glass of water, juice, soda, coffee, or tea.  Take if you go more than two days without a movement. °Do not use MiraLax more than once per day. Call your doctor if you are still constipated or irregular after using this medication for 7 days in a row. ° °If you continue to have problems with postoperative constipation, please contact the office for further assistance and recommendations.  If you experience "the worst abdominal pain ever" or develop nausea or vomiting, please contact the office immediatly for further recommendations for treatment. ° °ITCHING ° If you experience itching with your medications, try taking only a single pain pill, or even half a pain pill at a time.  You can also use Benadryl over the counter for itching or also to help with sleep.  ° °TED HOSE STOCKINGS °Wear the elastic stockings on both legs for three weeks following surgery during the day but you may remove then at night for sleeping. ° °MEDICATIONS °See your medication summary on the “After Visit Summary” that the nursing staff will review with you prior to discharge.  You may have some home medications which will be placed on hold until you complete the course of blood thinner medication.  It is important for you to complete the blood thinner medication as prescribed by your surgeon.  Continue your approved medications as instructed at time of discharge. ° °PRECAUTIONS °If you experience chest pain or shortness of breath - call 911 immediately for transfer to the hospital emergency department.  °If you develop a fever greater that 101 F, purulent drainage  from wound, increased redness or drainage from wound, foul odor from the wound/dressing, or calf pain - CONTACT YOUR SURGEON.   °                                                °FOLLOW-UP APPOINTMENTS °Make sure you keep all of your appointments after your operation with your surgeon and caregivers. You should call the office at the above phone number and make an appointment for approximately two weeks after the date of your surgery or on the date instructed by your surgeon outlined in the "After Visit Summary". ° °RANGE OF MOTION AND STRENGTHENING EXERCISES  °These exercises are designed to help you keep full movement of your hip joint. Follow your caregiver's or physical therapist's instructions. Perform all exercises about fifteen times, three times per day or as directed. Exercise both hips, even if you have   had only one joint replacement. These exercises can be done on a training (exercise) mat, on the floor, on a table or on a bed. Use whatever works the best and is most comfortable for you. Use music or television while you are exercising so that the exercises are a pleasant break in your day. This will make your life better with the exercises acting as a break in routine you can look forward to.  °• Lying on your back, slowly slide your foot toward your buttocks, raising your knee up off the floor. Then slowly slide your foot back down until your leg is straight again.  °• Lying on your back spread your legs as far apart as you can without causing discomfort.  °• Lying on your side, raise your upper leg and foot straight up from the floor as far as is comfortable. Slowly lower the leg and repeat.  °• Lying on your back, tighten up the muscle in the front of your thigh (quadriceps muscles). You can do this by keeping your leg straight and trying to raise your heel off the floor. This helps strengthen the largest muscle supporting your knee.  °• Lying on your back, tighten up the muscles of your buttocks both  with the legs straight and with the knee bent at a comfortable angle while keeping your heel on the floor.  ° °IF YOU ARE TRANSFERRED TO A SKILLED REHAB FACILITY °If the patient is transferred to a skilled rehab facility following release from the hospital, a list of the current medications will be sent to the facility for the patient to continue.  When discharged from the skilled rehab facility, please have the facility set up the patient's Home Health Physical Therapy prior to being released. Also, the skilled facility will be responsible for providing the patient with their medications at time of release from the facility to include their pain medication, the muscle relaxants, and their blood thinner medication. If the patient is still at the rehab facility at time of the two week follow up appointment, the skilled rehab facility will also need to assist the patient in arranging follow up appointment in our office and any transportation needs. ° °MAKE SURE YOU:  °• Understand these instructions.  °• Get help right away if you are not doing well or get worse.  ° ° °Pick up stool softner and laxative for home use following surgery while on pain medications. °Do not submerge incision under water. °Please use good hand washing techniques while changing dressing each day. °May shower starting three days after surgery. °Please use a clean towel to pat the incision dry following showers. °Continue to use ice for pain and swelling after surgery. °Do not use any lotions or creams on the incision until instructed by your surgeon. ° °

## 2018-06-19 LAB — CBC
HCT: 31.4 % — ABNORMAL LOW (ref 36.0–46.0)
Hemoglobin: 10.5 g/dL — ABNORMAL LOW (ref 12.0–15.0)
MCH: 33.8 pg (ref 26.0–34.0)
MCHC: 33.4 g/dL (ref 30.0–36.0)
MCV: 101 fL — ABNORMAL HIGH (ref 78.0–100.0)
Platelets: 167 10*3/uL (ref 150–400)
RBC: 3.11 MIL/uL — ABNORMAL LOW (ref 3.87–5.11)
RDW: 13.3 % (ref 11.5–15.5)
WBC: 9.2 10*3/uL (ref 4.0–10.5)

## 2018-06-19 LAB — BASIC METABOLIC PANEL
Anion gap: 8 (ref 5–15)
BUN: 8 mg/dL (ref 6–20)
CO2: 25 mmol/L (ref 22–32)
Calcium: 8.8 mg/dL — ABNORMAL LOW (ref 8.9–10.3)
Chloride: 108 mmol/L (ref 98–111)
Creatinine, Ser: 0.86 mg/dL (ref 0.44–1.00)
GFR calc Af Amer: >60 mL/min (ref >60)
GFR calc non Af Amer: >60 mL/min (ref >60)
Glucose, Bld: 129 mg/dL — ABNORMAL HIGH (ref 70–99)
Potassium: 4.4 mmol/L (ref 3.5–5.1)
Sodium: 141 mmol/L (ref 135–145)

## 2018-06-19 MED ORDER — METHOCARBAMOL 500 MG PO TABS: 500.0000 mg | ORAL_TABLET | Freq: Four times a day (QID) | ORAL | 0 refills | Status: DC | PRN

## 2018-06-19 MED ORDER — ASPIRIN 325 MG PO TBEC: 325.0000 mg | DELAYED_RELEASE_TABLET | Freq: Two times a day (BID) | ORAL | 0 refills | Status: DC

## 2018-06-19 MED ORDER — HYDROCODONE-ACETAMINOPHEN 5-325 MG PO TABS: 1.0000 | ORAL_TABLET | Freq: Four times a day (QID) | ORAL | 0 refills | Status: DC | PRN

## 2018-06-19 NOTE — Progress Notes (Signed)
Physical Therapy Treatment Patient Details Name: Laura Jones MRN: [id number] DOB: Aug 13, 1971 Today's Date: 06/19/2018    History of Present Illness Pt s/p L THR and with hx of PTSD, HIV, and R THR(14)    PT Comments    Pt cooperative and progressing with mobility despite c/o elevated pain level earlier am requiring pain meds by IV.   Follow Up Recommendations  Home health PT     Equipment Recommendations  None recommended by PT    Recommendations for Other Services       Precautions / Restrictions Precautions Precautions: Fall Restrictions Weight Bearing Restrictions: No Other Position/Activity Restrictions: WBAT    Mobility  Bed Mobility                  Transfers Overall transfer level: Needs assistance Equipment used: Rolling walker (2 wheeled) Transfers: Sit to/from Stand Sit to Stand: Min assist         General transfer comment: cues for LE management and use of UEs to self assist  Ambulation/Gait Ambulation/Gait assistance: Min assist;Min guard Gait Distance (Feet): 64 Feet Assistive device: Rolling walker (2 wheeled) Gait Pattern/deviations: Step-to pattern;Decreased step length - right;Decreased step length - left;Shuffle;Trunk flexed Gait velocity: decr   General Gait Details: cues for posture, position from RW and initial sequence    Stairs             Wheelchair Mobility    Modified Rankin (Stroke Patients Only)       Balance Overall balance assessment: No apparent balance deficits (not formally assessed)                                          Cognition Arousal/Alertness: Awake/alert Behavior During Therapy: WFL for tasks assessed/performed Overall Cognitive Status: Within Functional Limits for tasks assessed                                        Exercises Total Joint Exercises Ankle Circles/Pumps: AROM;Both;15 reps;Supine Quad Sets: AROM;Both;10 reps;Supine Heel Slides:  AAROM;Left;15 reps;Supine Hip ABduction/ADduction: AAROM;Left;15 reps;Supine    General Comments        Pertinent Vitals/Pain Pain Assessment: 0-10 Pain Score: 6  Pain Location: L hip Pain Descriptors / Indicators: Aching;Sore Pain Intervention(s): Limited activity within patient's tolerance;Monitored during session;Premedicated before session;Ice applied    Home Living                      Prior Function            PT Goals (current goals can now be found in the care plan section) Acute Rehab PT Goals Patient Stated Goal: Regain IND PT Goal Formulation: With patient Time For Goal Achievement: 06/25/18 Potential to Achieve Goals: Good Progress towards PT goals: Progressing toward goals    Frequency    7X/week      PT Plan Current plan remains appropriate    Co-evaluation              AM-PAC PT "6 Clicks" Daily Activity  Outcome Measure  Difficulty turning over in bed (including adjusting bedclothes, sheets and blankets)?: Unable Difficulty moving from lying on back to sitting on the side of the bed? : Unable Difficulty sitting down on and standing up from a chair with arms (e.g.,  wheelchair, bedside commode, etc,.)?: Unable Help needed moving to and from a bed to chair (including a wheelchair)?: A Little Help needed walking in hospital room?: A Little Help needed climbing 3-5 steps with a railing? : A Little 6 Click Score: 12    End of Session Equipment Utilized During Treatment: Gait belt Activity Tolerance: Patient tolerated treatment well Patient left: in chair;with call bell/phone within reach;with family/visitor present Nurse Communication: Mobility status PT Visit Diagnosis: Difficulty in walking, not elsewhere classified (R26.2);Pain Pain - Right/Left: Left Pain - part of body: Hip     Time: 4401-0272 PT Time Calculation (min) (ACUTE ONLY): 26 min  Charges:  $Gait Training: 8-22 mins $Therapeutic Exercise: 8-22 mins                     G Codes:       Pg [phone]    Jarmal Lewelling 06/19/2018, 12:21 PM

## 2018-06-19 NOTE — Progress Notes (Signed)
Physical Therapy Treatment Patient Details Name: Laura LagerBabette M Jones MRN: [id number] DOB: [birth year] Today's Date: 06/19/2018    History of Present Illness Pt s/p L THR and with hx of PTSD, HIV, and R THR(14)    PT Comments    Pt progressing steadily with mobility and hopeful for return home tomorrow.  Follow Up Recommendations  Home health PT     Equipment Recommendations  None recommended by PT    Recommendations for Other Services       Precautions / Restrictions Precautions Precautions: Fall Restrictions Weight Bearing Restrictions: No Other Position/Activity Restrictions: WBAT    Mobility  Bed Mobility               General bed mobility comments: Pt up in chair and requests back to same  Transfers Overall transfer level: Needs assistance Equipment used: Rolling walker (2 wheeled) Transfers: Sit to/from Stand Sit to Stand: Min guard         General transfer comment: cues for LE management and use of UEs to self assist  Ambulation/Gait Ambulation/Gait assistance: Min assist;Min guard Gait Distance (Feet): 180 Feet Assistive device: Rolling walker (2 wheeled) Gait Pattern/deviations: Decreased step length - right;Decreased step length - left;Shuffle;Trunk flexed;Step-to pattern;Step-through pattern Gait velocity: decr   General Gait Details: cues for posture, position from RW and initial sequence    Stairs             Wheelchair Mobility    Modified Rankin (Stroke Patients Only)       Balance Overall balance assessment: No apparent balance deficits (not formally assessed)                                          Cognition Arousal/Alertness: Awake/alert Behavior During Therapy: WFL for tasks assessed/performed Overall Cognitive Status: Within Functional Limits for tasks assessed                                        Exercises Total Joint Exercises Ankle Circles/Pumps: AROM;Both;15  reps;Supine Quad Sets: AROM;Both;10 reps;Supine Heel Slides: AAROM;Left;15 reps;Supine Hip ABduction/ADduction: AAROM;Left;15 reps;Supine    General Comments        Pertinent Vitals/Pain Pain Assessment: 0-10 Pain Score: 6  Pain Location: L hip Pain Descriptors / Indicators: Aching;Sore Pain Intervention(s): Limited activity within patient's tolerance;Monitored during session;Premedicated before session;Ice applied    Home Living                      Prior Function            PT Goals (current goals can now be found in the care plan section) Acute Rehab PT Goals Patient Stated Goal: Regain IND PT Goal Formulation: With patient Time For Goal Achievement: 06/25/18 Potential to Achieve Goals: Good Progress towards PT goals: Progressing toward goals    Frequency    7X/week      PT Plan Current plan remains appropriate    Co-evaluation              AM-PAC PT "6 Clicks" Daily Activity  Outcome Measure  Difficulty turning over in bed (including adjusting bedclothes, sheets and blankets)?: Unable Difficulty moving from lying on back to sitting on the side of the bed? : Unable Difficulty sitting down on and standing up from a  chair with arms (e.g., wheelchair, bedside commode, etc,.)?: A Little Help needed moving to and from a bed to chair (including a wheelchair)?: A Little Help needed walking in hospital room?: A Little Help needed climbing 3-5 steps with a railing? : A Little 6 Click Score: 14    End of Session Equipment Utilized During Treatment: Gait belt Activity Tolerance: Patient tolerated treatment well Patient left: in chair;with call bell/phone within reach;with family/visitor present Nurse Communication: Mobility status PT Visit Diagnosis: Difficulty in walking, not elsewhere classified (R26.2);Pain Pain - Right/Left: Left Pain - part of body: Hip     Time: 1355-1416 PT Time Calculation (min) (ACUTE ONLY): 21 min  Charges:  $Gait  Training: 8-22 mins $Therapeutic Exercise: 8-22 mins                    G Codes:       Pg [phone]    Nastacia Raybuck 06/19/2018, 2:30 PM

## 2018-06-19 NOTE — Progress Notes (Signed)
[facility] Pt will not need any DME, related to pt already having DME at home from last surgery.

## 2018-06-19 NOTE — Progress Notes (Signed)
   Subjective: 1 Day Post-Op Procedure(s) (LRB): LEFT TOTAL HIP ARTHROPLASTY ANTERIOR APPROACH (Left) Patient reports pain as mild.   Patient seen in rounds by Dr. Lequita Jones. Patient is well, and has had no acute complaints or problems. Pt did well with therapy yesterday, will continue working with them today. Foley removed this AM. Denies chest pain or SOB.   Objective: Vital signs in last 24 hours: Temp:  [97.6 F (36.4 C)-99.2 F (37.3 C)] 99.2 F (37.3 C) (06/27 0558) Pulse Rate:  [81-106] 104 (06/27 0558) Resp:  [13-20] 18 (06/27 0558) BP: (88-128)/(63-89) 121/72 (06/27 0558) SpO2:  [95 %-100 %] 97 % (06/27 0558)  Intake/Output from previous day:  Intake/Output Summary (Last 24 hours) at 06/19/2018 0717 Last data filed at 06/19/2018 [id number] Gross per 24 hour  Intake 5072.5 ml  Output 4555 ml  Net 517.5 ml    Labs: Recent Labs    06/16/18 1135 06/19/18 0506  HGB 12.2 10.5*   Recent Labs    06/16/18 1135 06/19/18 0506  WBC 3.0* 9.2  RBC 3.65* 3.11*  HCT 36.9 31.4*  PLT 168 167   Recent Labs    06/16/18 1135 06/19/18 0506  NA 141 141  K 3.9 4.4  CL 104 108  CO2 29 25  BUN 11 8  CREATININE 0.88 0.86  GLUCOSE 91 129*  CALCIUM 9.1 8.8*   Recent Labs    06/16/18 1135  INR 0.98    Exam: General - Patient is Alert and Oriented Extremity - Neurologically intact Neurovascular intact Sensation intact distally Dorsiflexion/Plantar flexion intact Dressing - dressing C/D/I Motor Function - intact, moving foot and toes well on exam.   Past Medical History:  Diagnosis Date  . Anemia    hx of   . Anxiety   . Arthritis    left knee  . Avascular necrosis of bone of hip, left (HCC)   . Bulging lumbar disc   . Depression   . Dyspnea    with excertion  . GERD (gastroesophageal reflux disease)   . Hepatitis    C treated 2 years ago went undetected  . History of kidney stones   . HIV (human immunodeficiency virus infection) (HCC) 07/29/2017  . HIV infection  (HCC)   . Insomnia   . Migraines   . Nerve pain   . Perimenopausal   . Porphyria (HCC)   . PTSD (post-traumatic stress disorder)   . Spinal stenosis of lumbosacral region   . Tendonitis    right arm     Assessment/Plan: 1 Day Post-Op Procedure(s) (LRB): LEFT TOTAL HIP ARTHROPLASTY ANTERIOR APPROACH (Left) Principal Problem:   OA (osteoarthritis) of hip  Estimated body mass index is 34.58 kg/m as calculated from the following:   Height as of this encounter: 5\' 1"  (1.549 m).   Weight as of this encounter: 83 kg (183 lb). Advance diet Up with therapy D/C IV fluids  DVT Prophylaxis - Aspirin Weight bearing as tolerated. D/C O2 and pulse ox and try on room air. Hemovac pulled without difficulty, will continue working with therapy.  Plan is to go Home with HHPT after hospital stay. Possible discharge this afternoon if progresses with therapy and meeting goals.   Laura AbbottKristie Syncere Kaminski, PA-C Orthopedic Surgery 06/19/2018, 7:17 AM

## 2018-06-20 LAB — CBC
HCT: 28.8 % — ABNORMAL LOW (ref 36.0–46.0)
Hemoglobin: 9.5 g/dL — ABNORMAL LOW (ref 12.0–15.0)
MCH: 33.7 pg (ref 26.0–34.0)
MCHC: 33 g/dL (ref 30.0–36.0)
MCV: 102.1 fL — ABNORMAL HIGH (ref 78.0–100.0)
Platelets: 148 10*3/uL — ABNORMAL LOW (ref 150–400)
RBC: 2.82 MIL/uL — ABNORMAL LOW (ref 3.87–5.11)
RDW: 13.6 % (ref 11.5–15.5)
WBC: 8.1 10*3/uL (ref 4.0–10.5)

## 2018-06-20 LAB — BASIC METABOLIC PANEL
Anion gap: 9 (ref 5–15)
BUN: 13 mg/dL (ref 6–20)
CO2: 26 mmol/L (ref 22–32)
Calcium: 8.9 mg/dL (ref 8.9–10.3)
Chloride: 107 mmol/L (ref 98–111)
Creatinine, Ser: 0.82 mg/dL (ref 0.44–1.00)
GFR calc Af Amer: >60 mL/min (ref >60)
GFR calc non Af Amer: >60 mL/min (ref >60)
Glucose, Bld: 158 mg/dL — ABNORMAL HIGH (ref 70–99)
Potassium: 4.2 mmol/L (ref 3.5–5.1)
Sodium: 142 mmol/L (ref 135–145)

## 2018-06-20 MED ORDER — ASPIRIN 325 MG PO TBEC: 325.0000 mg | DELAYED_RELEASE_TABLET | Freq: Two times a day (BID) | ORAL | 0 refills | Status: AC

## 2018-06-20 MED ORDER — HYDROCODONE-ACETAMINOPHEN 5-325 MG PO TABS: 1.0000 | ORAL_TABLET | Freq: Four times a day (QID) | ORAL | 0 refills | Status: DC | PRN

## 2018-06-20 NOTE — Progress Notes (Signed)
Physical Therapy Treatment Patient Details Name: Laura Jones MRN: [id number] DOB: Nov 01, 1971 Today's Date: 06/20/2018    History of Present Illness Pt s/p L THR and with hx of PTSD, HIV, and R THR(14)    PT Comments    Pt progressing steadily with mobility and eager for dc home.  Reviewed home therex, stairs and car transfers.   Follow Up Recommendations  Home health PT     Equipment Recommendations  None recommended by PT    Recommendations for Other Services       Precautions / Restrictions Precautions Precautions: Fall Restrictions Weight Bearing Restrictions: No Other Position/Activity Restrictions: WBAT    Mobility  Bed Mobility               General bed mobility comments: Pt up in chair and requests back to same  Transfers Overall transfer level: Needs assistance Equipment used: Rolling walker (2 wheeled) Transfers: Sit to/from Stand Sit to Stand: Supervision         General transfer comment: Pt self-cues for LE management and use of UEs to self assist  Ambulation/Gait Ambulation/Gait assistance: Min guard;Supervision Gait Distance (Feet): 200 Feet Assistive device: Rolling walker (2 wheeled) Gait Pattern/deviations: Decreased step length - right;Decreased step length - left;Shuffle;Trunk flexed;Step-to pattern;Step-through pattern Gait velocity: decr   General Gait Details: cues for posture, position from RW and initial sequence    Stairs             Wheelchair Mobility    Modified Rankin (Stroke Patients Only)       Balance Overall balance assessment: No apparent balance deficits (not formally assessed)                                          Cognition Arousal/Alertness: Awake/alert Behavior During Therapy: WFL for tasks assessed/performed Overall Cognitive Status: Within Functional Limits for tasks assessed                                        Exercises Total Joint  Exercises Ankle Circles/Pumps: AROM;Both;15 reps;Supine Quad Sets: AROM;Both;10 reps;Supine Heel Slides: AAROM;Left;Supine;20 reps Hip ABduction/ADduction: AAROM;Left;Supine;20 reps Long Arc Quad: AROM;Left;15 reps;Seated    General Comments        Pertinent Vitals/Pain Pain Assessment: 0-10 Pain Score: 5  Pain Location: L hip Pain Descriptors / Indicators: Aching;Sore Pain Intervention(s): Limited activity within patient's tolerance;Monitored during session;Premedicated before session;Ice applied    Home Living                      Prior Function            PT Goals (current goals can now be found in the care plan section) Acute Rehab PT Goals Patient Stated Goal: Regain IND PT Goal Formulation: With patient Time For Goal Achievement: 06/25/18 Potential to Achieve Goals: Good Progress towards PT goals: Progressing toward goals    Frequency    7X/week      PT Plan Current plan remains appropriate    Co-evaluation              AM-PAC PT "6 Clicks" Daily Activity  Outcome Measure  Difficulty turning over in bed (including adjusting bedclothes, sheets and blankets)?: Unable Difficulty moving from lying on back to sitting on the side of the  bed? : Unable Difficulty sitting down on and standing up from a chair with arms (e.g., wheelchair, bedside commode, etc,.)?: A Little Help needed moving to and from a bed to chair (including a wheelchair)?: A Little Help needed walking in hospital room?: A Little Help needed climbing 3-5 steps with a railing? : A Little 6 Click Score: 14    End of Session Equipment Utilized During Treatment: Gait belt Activity Tolerance: Patient tolerated treatment well Patient left: in chair;with call bell/phone within reach;with family/visitor present Nurse Communication: Mobility status PT Visit Diagnosis: Difficulty in walking, not elsewhere classified (R26.2);Pain Pain - Right/Left: Left Pain - part of body: Hip      Time: 1610-9604 PT Time Calculation (min) (ACUTE ONLY): 32 min  Charges:  $Gait Training: 8-22 mins $Therapeutic Exercise: 8-22 mins                    G Codes:       Pg [phone]    Kathe Wirick 06/20/2018, 11:05 AM

## 2018-06-20 NOTE — Care Management Note (Signed)
Case Management Note  Patient Details  Name: Jeri LagerBabette M Snooks MRN: [id number] Date of Birth: September 30, 1971  Subjective/Objective:                  Discharge needs=[facility] for pt and rolling walker for dme [facility]  Action/Plan: [facility] notified of [facility] needs and dme  Expected Discharge Date:  06/20/18               Expected Discharge Plan:  Home w Home Health Services  In-House Referral:     Discharge planning Services     Post Acute Care Choice:    Choice offered to:  Patient  DME Arranged:    DME Agency:     HH Arranged:  PT, OT HH Agency:  [facility]  Status of Service:  Completed, signed off  If discussed at Long Length of Stay Meetings, dates discussed:    Additional Comments:  Golda AcreDavis, Rhonda Lynn, RN 06/20/2018, 9:22 AM

## 2018-06-20 NOTE — Progress Notes (Signed)
   Subjective: 2 Days Post-Op Procedure(s) (LRB): LEFT TOTAL HIP ARTHROPLASTY ANTERIOR APPROACH (Left) Patient reports pain as mild.   Patient seen in rounds with Dr. Lequita HaltAluisio. Patient is well, and has had no acute complaints or problems. States she feels ready to go home today. Voiding without difficulty and positive flatus. Denies chest pain or SOB. Did very well ambulating with therapy yesterday. We will resume therapy today.  Plan is to go Home after hospital stay.  Objective: Vital signs in last 24 hours: Temp:  [98 F (36.7 C)-98.9 F (37.2 C)] 98 F (36.7 C) (06/28 0452) Pulse Rate:  [93-108] 93 (06/28 0452) Resp:  [16-18] 18 (06/28 0452) BP: (110-135)/(71-75) 111/71 (06/28 0452) SpO2:  [95 %-97 %] 95 % (06/28 0452)  Intake/Output from previous day:  Intake/Output Summary (Last 24 hours) at 06/20/2018 0717 Last data filed at 06/19/2018 1645 Gross per 24 hour  Intake 1200 ml  Output 1300 ml  Net -100 ml    Labs: Recent Labs    06/19/18 0506 06/20/18 0525  HGB 10.5* 9.5*   Recent Labs    06/19/18 0506 06/20/18 0525  WBC 9.2 8.1  RBC 3.11* 2.82*  HCT 31.4* 28.8*  PLT 167 148*   Recent Labs    06/19/18 0506 06/20/18 0525  NA 141 142  K 4.4 4.2  CL 108 107  CO2 25 26  BUN 8 13  CREATININE 0.86 0.82  GLUCOSE 129* 158*  CALCIUM 8.8* 8.9   EXAM General - Patient is Alert and Oriented Extremity - Neurologically intact Neurovascular intact Sensation intact distally Dorsiflexion/Plantar flexion intact Dressing - dressing C/D/I Motor Function - intact, moving foot and toes well on exam.   Past Medical History:  Diagnosis Date  . Anemia    hx of   . Anxiety   . Arthritis    left knee  . Avascular necrosis of bone of hip, left (HCC)   . Bulging lumbar disc   . Depression   . Dyspnea    with excertion  . GERD (gastroesophageal reflux disease)   . Hepatitis    C treated 2 years ago went undetected  . History of kidney stones   . HIV (human  immunodeficiency virus infection) (HCC) 07/29/2017  . HIV infection (HCC)   . Insomnia   . Migraines   . Nerve pain   . Perimenopausal   . Porphyria (HCC)   . PTSD (post-traumatic stress disorder)   . Spinal stenosis of lumbosacral region   . Tendonitis    right arm     Assessment/Plan: 2 Days Post-Op Procedure(s) (LRB): LEFT TOTAL HIP ARTHROPLASTY ANTERIOR APPROACH (Left) Principal Problem:   OA (osteoarthritis) of hip  Estimated body mass index is 34.58 kg/m as calculated from the following:   Height as of this encounter: 5\' 1"  (1.549 m).   Weight as of this encounter: 83 kg (183 lb). Up with therapy D/C IV fluids  DVT Prophylaxis - Aspirin Weight-bearing as tolerated.   Plan for discharge after one session of therapy this AM. Scheduled for HHPT. Follow-up in the office in 2 weeks with Dr. Lequita HaltAluisio.  Arther AbbottKristie Kateri Balch, PA-C Orthopaedic Surgery 06/20/2018, 7:17 AM

## 2018-06-21 DIAGNOSIS — K219 Gastro-esophageal reflux disease without esophagitis: Secondary | ICD-10-CM | POA: Diagnosis not present

## 2018-06-21 DIAGNOSIS — M4807 Spinal stenosis, lumbosacral region: Secondary | ICD-10-CM | POA: Diagnosis not present

## 2018-06-21 DIAGNOSIS — Z96643 Presence of artificial hip joint, bilateral: Secondary | ICD-10-CM | POA: Diagnosis not present

## 2018-06-21 DIAGNOSIS — Z9181 History of falling: Secondary | ICD-10-CM | POA: Diagnosis not present

## 2018-06-21 DIAGNOSIS — M5126 Other intervertebral disc displacement, lumbar region: Secondary | ICD-10-CM | POA: Diagnosis not present

## 2018-06-21 DIAGNOSIS — G43909 Migraine, unspecified, not intractable, without status migrainosus: Secondary | ICD-10-CM | POA: Diagnosis not present

## 2018-06-21 DIAGNOSIS — M1712 Unilateral primary osteoarthritis, left knee: Secondary | ICD-10-CM | POA: Diagnosis not present

## 2018-06-21 DIAGNOSIS — E7800 Pure hypercholesterolemia, unspecified: Secondary | ICD-10-CM | POA: Diagnosis not present

## 2018-06-21 DIAGNOSIS — G4700 Insomnia, unspecified: Secondary | ICD-10-CM | POA: Diagnosis not present

## 2018-06-21 DIAGNOSIS — Z471 Aftercare following joint replacement surgery: Secondary | ICD-10-CM | POA: Diagnosis not present

## 2018-06-21 DIAGNOSIS — D649 Anemia, unspecified: Secondary | ICD-10-CM | POA: Diagnosis not present

## 2018-06-21 DIAGNOSIS — Z7982 Long term (current) use of aspirin: Secondary | ICD-10-CM | POA: Diagnosis not present

## 2018-06-23 NOTE — Discharge Summary (Signed)
Physician Discharge Summary   Patient ID: Laura Jones MRN: [id number] DOB/AGE: 1971/01/07 47 y.o.  Admit date: 06/18/2018 Discharge date: 06/20/2018  Primary Diagnosis: Osteonecrosis of the left hip   Admission Diagnoses:  Past Medical History:  Diagnosis Date  . Anemia    hx of   . Anxiety   . Arthritis    left knee  . Avascular necrosis of bone of hip, left ([place])   . Bulging lumbar disc   . Depression   . Dyspnea    with excertion  . GERD (gastroesophageal reflux disease)   . Hepatitis    C treated 2 years ago went undetected  . History of kidney stones   . HIV (human immunodeficiency virus infection) ([place]) 07/29/2017  . HIV infection ([place])   . Insomnia   . Migraines   . Nerve pain   . Perimenopausal   . Porphyria ([place])   . PTSD (post-traumatic stress disorder)   . Spinal stenosis of lumbosacral region   . Tendonitis    right arm    Discharge Diagnoses:   Principal Problem:   OA (osteoarthritis) of hip  Estimated body mass index is 34.58 kg/m as calculated from the following:   Height as of this encounter: _0  (1.549 m).   Weight as of this encounter: 83 kg (183 lb).  Procedure(s) (LRB): LEFT TOTAL HIP ARTHROPLASTY ANTERIOR APPROACH (Left)   Consults: None  HPI: Laura Jones is a 47 y.o. female who has advanced osteonecrosis of their Left  hip with progressively worsening pain and dysfunction.The patient has failed nonoperative management and presents for total hip arthroplasty.   Laboratory Data: Admission on 06/18/2018, Discharged on 06/20/2018  Component Date Value Ref Range Status  . WBC 06/19/2018 9.2  4.0 - 10.5 K/uL Final  . RBC 06/19/2018 3.11* 3.87 - 5.11 MIL/uL Final  . Hemoglobin 06/19/2018 10.5* 12.0 - 15.0 g/dL Final  . HCT 06/19/2018 31.4* 36.0 - 46.0 % Final  . MCV 06/19/2018 101.0* 78.0 - 100.0 fL Final  . MCH 06/19/2018 33.8  26.0 - 34.0 pg Final  . MCHC 06/19/2018 33.4  30.0 - 36.0 g/dL Final  . RDW 06/19/2018 13.3  11.5  - 15.5 % Final  . Platelets 06/19/2018 167  150 - 400 K/uL Final   Performed at [facility], [place] [address], [place], [place] [address]  . Sodium 06/19/2018 141  135 - 145 mmol/L Final  . Potassium 06/19/2018 4.4  3.5 - 5.1 mmol/L Final  . Chloride 06/19/2018 108  98 - 111 mmol/L Final   Please note change in reference range.  . CO2 06/19/2018 25  22 - 32 mmol/L Final  . Glucose, Bld 06/19/2018 129* 70 - 99 mg/dL Final   Please note change in reference range.  . BUN 06/19/2018 8  6 - 20 mg/dL Final   Please note change in reference range.  . Creatinine, Ser 06/19/2018 0.86  0.44 - 1.00 mg/dL Final  . Calcium 06/19/2018 8.8* 8.9 - 10.3 mg/dL Final  . GFR calc non Af Amer 06/19/2018 >60  >60 mL/min Final  . GFR calc Af Amer 06/19/2018 >60  >60 mL/min Final   Comment: (NOTE) The eGFR has been calculated using the CKD EPI equation. This calculation has not been validated in all clinical situations. eGFR's persistently <60 mL/min signify possible Chronic Kidney Disease.   Georgiann Hahn gap 06/19/2018 8  5 - 15 Final   Performed at [facility], [place]  [address], [place], [place] [address]  . WBC 06/20/2018 8.1  4.0 - 10.5 K/uL Final  . RBC 06/20/2018 2.82* 3.87 - 5.11 MIL/uL Final  . Hemoglobin 06/20/2018 9.5* 12.0 - 15.0 g/dL Final  . HCT 06/20/2018 28.8* 36.0 - 46.0 % Final  . MCV 06/20/2018 102.1* 78.0 - 100.0 fL Final  . MCH 06/20/2018 33.7  26.0 - 34.0 pg Final  . MCHC 06/20/2018 33.0  30.0 - 36.0 g/dL Final  . RDW 06/20/2018 13.6  11.5 - 15.5 % Final  . Platelets 06/20/2018 148* 150 - 400 K/uL Final   Performed at [facility], [place] [address] [place], [place] [address]  . Sodium 06/20/2018 142  135 - 145 mmol/L Final  . Potassium 06/20/2018 4.2  3.5 - 5.1 mmol/L Final  . Chloride 06/20/2018 107  98 - 111 mmol/L Final   Please note change in reference range.  . CO2 06/20/2018 26  22 - 32 mmol/L Final  . Glucose, Bld 06/20/2018  158* 70 - 99 mg/dL Final   Please note change in reference range.  . BUN 06/20/2018 13  6 - 20 mg/dL Final   Please note change in reference range.  . Creatinine, Ser 06/20/2018 0.82  0.44 - 1.00 mg/dL Final  . Calcium 06/20/2018 8.9  8.9 - 10.3 mg/dL Final  . GFR calc non Af Amer 06/20/2018 >60  >60 mL/min Final  . GFR calc Af Amer 06/20/2018 >60  >60 mL/min Final   Comment: (NOTE) The eGFR has been calculated using the CKD EPI equation. This calculation has not been validated in all clinical situations. eGFR's persistently <60 mL/min signify possible Chronic Kidney Disease.   Georgiann Hahn gap 06/20/2018 9  5 - 15 Final   Performed at [facility], [place] [address] [place], [place] [address]  Hospital Outpatient Visit on 06/16/2018  Component Date Value Ref Range Status  . aPTT 06/16/2018 27  24 - 36 seconds Final   Performed at [facility], [place] [address], [place], [place] [address]  . WBC 06/16/2018 3.0* 4.0 - 10.5 K/uL Final  . RBC 06/16/2018 3.65* 3.87 - 5.11 MIL/uL Final  . Hemoglobin 06/16/2018 12.2  12.0 - 15.0 g/dL Final  . HCT 06/16/2018 36.9  36.0 - 46.0 % Final  . MCV 06/16/2018 101.1* 78.0 - 100.0 fL Final  . MCH 06/16/2018 33.4  26.0 - 34.0 pg Final  . MCHC 06/16/2018 33.1  30.0 - 36.0 g/dL Final  . RDW 06/16/2018 13.4  11.5 - 15.5 % Final  . Platelets 06/16/2018 168  150 - 400 K/uL Final   Performed at [facility], [place] [address], [place], [place] [address]  . Sodium 06/16/2018 141  135 - 145 mmol/L Final  . Potassium 06/16/2018 3.9  3.5 - 5.1 mmol/L Final  . Chloride 06/16/2018 104  101 - 111 mmol/L Final  . CO2 06/16/2018 29  22 - 32 mmol/L Final  . Glucose, Bld 06/16/2018 91  65 - 99 mg/dL Final  . BUN 06/16/2018 11  6 - 20 mg/dL Final  . Creatinine, Ser 06/16/2018 0.88  0.44 - 1.00 mg/dL Final  . Calcium 06/16/2018 9.1  8.9 - 10.3 mg/dL Final  . Total Protein 06/16/2018 8.0  6.5 - 8.1 g/dL Final  . Albumin  06/16/2018 4.1  3.5 - 5.0 g/dL Final  . AST 06/16/2018 21  15 - 41 U/L Final  . ALT 06/16/2018 19  14 - 54 U/L Final  .  Alkaline Phosphatase 06/16/2018 81  38 - 126 U/L Final  . Total Bilirubin 06/16/2018 0.2* 0.3 - 1.2 mg/dL Final  . GFR calc non Af Amer 06/16/2018 >60  >60 mL/min Final  . GFR calc Af Amer 06/16/2018 >60  >60 mL/min Final   Comment: (NOTE) The eGFR has been calculated using the CKD EPI equation. This calculation has not been validated in all clinical situations. eGFR's persistently <60 mL/min signify possible Chronic Kidney Disease.   Georgiann Hahn gap 06/16/2018 8  5 - 15 Final   Performed at [facility], [place] [address] [place], [place] [address]  . Prothrombin Time 06/16/2018 12.9  11.4 - 15.2 seconds Final  . INR 06/16/2018 0.98   Final   Performed at [facility], [place] [address], [place], [place] [address]  . ABO/RH(D) 06/16/2018 A POS   Final  . Antibody Screen 06/16/2018 NEG   Final  . Sample Expiration 06/16/2018 06/21/2018   Final  . Extend sample reason 06/16/2018    Final                   Value:NO TRANSFUSIONS OR PREGNANCY IN THE PAST 3 MONTHS Performed at [facility], [place] [address], [place], [place] [address]   . Color, Urine 06/16/2018 STRAW* YELLOW Final  . APPearance 06/16/2018 CLEAR  CLEAR Final  . Specific Gravity, Urine 06/16/2018 1.004* 1.005 - 1.030 Final  . pH 06/16/2018 7.0  5.0 - 8.0 Final  . Glucose, UA 06/16/2018 50* NEGATIVE mg/dL Final  . Hgb urine dipstick 06/16/2018 NEGATIVE  NEGATIVE Final  . Bilirubin Urine 06/16/2018 NEGATIVE  NEGATIVE Final  . Ketones, ur 06/16/2018 NEGATIVE  NEGATIVE mg/dL Final  . Protein, ur 06/16/2018 NEGATIVE  NEGATIVE mg/dL Final  . Nitrite 06/16/2018 NEGATIVE  NEGATIVE Final  . Leukocytes, UA 06/16/2018 NEGATIVE  NEGATIVE Final   Performed at [place] [address] [place], [place] [address]  . MRSA, PCR 06/16/2018 NEGATIVE   NEGATIVE Final  . Staphylococcus aureus 06/16/2018 POSITIVE* NEGATIVE Final   Comment: (NOTE) The Xpert SA Assay (FDA approved for NASAL specimens in patients 32 years of age and older), is one component of a comprehensive surveillance program. It is not intended to diagnose infection nor to guide or monitor treatment. Performed at [facility], [place] [address] [place], [place] [address]   . ABO/RH(D) 06/16/2018    Final                   Value:A POS Performed at [facility], [place] [address], [place], [place] [address]      X-Rays:Dg Pelvis Portable  Result Date: 06/18/2018 CLINICAL DATA:  Postop left total hip joint prosthesis placement. EXAM: PORTABLE PELVIS 1-2 VIEWS COMPARISON:  Intraoperative fluoro spot views of today's date FINDINGS: The patient has undergone left total hip joint prosthesis placement. Positioning of the prosthetic components is good and the interface with the native bone appears normal. A surgical drain line is present. IMPRESSION: No postprocedure complication following left total hip joint prosthesis placement. There is a pre-existing right total hip joint prosthesis. Electronically Signed   By: David  Martinique M.D.   On: 06/18/2018 12:29   Dg C-arm 1-60 Min-no Report  Result Date: 06/18/2018 Fluoroscopy was utilized by the requesting physician.  No radiographic interpretation.    EKG:No orders found for this or any previous visit.   Hospital Course: Patient was admitted to [facility] and  taken to the OR and underwent the above state procedure without complications.  Patient tolerated the procedure well and was later transferred to the recovery room and then to the orthopaedic floor for postoperative care.  They were given PO and IV analgesics for pain control following their surgery.  They were given 24 hours of postoperative antibiotics of  Anti-infectives (From admission, onward)   Start     Dose/Rate Route  Frequency Ordered Stop   06/18/18 2200  elvitegravir-cobicistat-emtricitabine-tenofovir (GENVOYA) 150-150-200-10 MG tablet 1 tablet  Status:  Discontinued    Note to Pharmacy:  Patient taking differently: Takes at night 30 minutes after nausea medication     1 tablet Oral Daily at bedtime 06/18/18 1219 06/20/18 1750   06/18/18 1500  ceFAZolin (ANCEF) IVPB 2g/100 mL premix     2 g 200 mL/hr over 30 Minutes Intravenous Every 6 hours 06/18/18 1219 06/18/18 2144   06/18/18 0630  ceFAZolin (ANCEF) IVPB 2g/100 mL premix     2 g 200 mL/hr over 30 Minutes Intravenous On call to O.R. 06/18/18 6568 06/18/18 1275     and started on DVT prophylaxis in the form of Aspirin.   PT and OT were ordered for total hip protocol.  The patient was allowed to be WBAT with therapy. Discharge planning was consulted to help with postop disposition and equipment needs.  Patient had a good night on the evening of surgery. They started to get up OOB with therapy on POD #0. Continued to work with therapy through POD #1. Hemovac drain was pulled without difficulty. Patient was seen during rounds on POD #2 and was ready to go home. Dressing was changed and the incision was clean, dry, and intact with no drainage. Patient completed one additional session of therapy on POD #2 and was meeting her goals. She was discharged later that day to home in stable condition.  Diet: Regular diet Activity:WBAT Follow-up: in 2 weeks at the office with Dr. Wynelle Link Disposition - Home with HHPT Discharged Condition: stable   Discharge Instructions    Call MD / Call 911   Complete by:  As directed    If you experience chest pain or shortness of breath, CALL 911 and be transported to the hospital emergency room.  If you develope a fever above 101 F, pus (white drainage) or increased drainage or redness at the wound, or calf pain, call your surgeon's office.   Change dressing   Complete by:  As directed    You may change your dressing on Friday  (06/20/18), then change the dressing daily with sterile 4 x 4 inch gauze dressing and paper tape.  You may clean the incision with alcohol prior to redressing   Constipation Prevention   Complete by:  As directed    Drink plenty of fluids.  Prune juice may be helpful.  You may use a stool softener, such as Colace (over the counter) 100 mg twice a day.  Use MiraLax (over the counter) for constipation as needed.   Diet - low sodium heart healthy   Complete by:  As directed    Discharge instructions   Complete by:  As directed    Dr. Gaynelle Arabian Total Joint Specialist [facility] [address] [place], [place] [address] [phone]  ANTERIOR APPROACH TOTAL HIP REPLACEMENT POSTOPERATIVE DIRECTIONS   Hip Rehabilitation, Guidelines Following Surgery  The results of a hip operation are greatly improved after range of motion and muscle strengthening exercises. Follow all safety  measures which are given to protect your hip. If any of these exercises cause increased pain or swelling in your joint, decrease the amount until you are comfortable again. Then slowly increase the exercises. Call your caregiver if you have problems or questions.   HOME CARE INSTRUCTIONS  Remove items at home which could result in a fall. This includes throw rugs or furniture in walking pathways.  ICE to the affected hip every three hours for 30 minutes at a time and then as needed for pain and swelling.  Continue to use ice on the hip for pain and swelling from surgery. You may notice swelling that will progress down to the foot and ankle.  This is normal after surgery.  Elevate the leg when you are not up walking on it.   Continue to use the breathing machine which will help keep your temperature down.  It is common for your temperature to cycle up and down following surgery, especially at night when you are not up moving around and exerting yourself.  The breathing machine keeps your lungs expanded and your  temperature down.  DIET You may resume your previous home diet once your are discharged from the hospital.  DRESSING / WOUND CARE / SHOWERING You may shower 3 days after surgery, but keep the wounds dry during showering.  You may use an occlusive plastic wrap (Press'n Seal for example), NO SOAKING/SUBMERGING IN THE BATHTUB.  If the bandage gets wet, change with a clean dry gauze.  If the incision gets wet, pat the wound dry with a clean towel. You may start showering once you are discharged home but do not submerge the incision under water. Just pat the incision dry and apply a dry gauze dressing on daily. Change the surgical dressing daily and reapply a dry dressing each time.  ACTIVITY Walk with your walker as instructed. Use walker as long as suggested by your caregivers. Avoid periods of inactivity such as sitting longer than an hour when not asleep. This helps prevent blood clots.  You may resume a sexual relationship in one month or when given the OK by your doctor.  You may return to work once you are cleared by your doctor.  Do not drive a car for 6 weeks or until released by you surgeon.  Do not drive while taking narcotics.  WEIGHT BEARING Weight bearing as tolerated with assist device (walker, cane, etc) as directed, use it as long as suggested by your surgeon or therapist, typically at least 4-6 weeks.  POSTOPERATIVE CONSTIPATION PROTOCOL Constipation - defined medically as fewer than three stools per week and severe constipation as less than one stool per week.  One of the most common issues patients have following surgery is constipation.  Even if you have a regular bowel pattern at home, your normal regimen is likely to be disrupted due to multiple reasons following surgery.  Combination of anesthesia, postoperative narcotics, change in appetite and fluid intake all can affect your bowels.  In order to avoid complications following surgery, here are some recommendations in  order to help you during your recovery period.  Colace (docusate) - Pick up an over-the-counter form of Colace or another stool softener and take twice a day as long as you are requiring postoperative pain medications.  Take with a full glass of water daily.  If you experience loose stools or diarrhea, hold the colace until you stool forms back up.  If your symptoms do not get better within 1  week or if they get worse, check with your doctor.  Dulcolax (bisacodyl) - Pick up over-the-counter and take as directed by the product packaging as needed to assist with the movement of your bowels.  Take with a full glass of water.  Use this product as needed if not relieved by Colace only.   MiraLax (polyethylene glycol) - Pick up over-the-counter to have on hand.  MiraLax is a solution that will increase the amount of water in your bowels to assist with bowel movements.  Take as directed and can mix with a glass of water, juice, soda, coffee, or tea.  Take if you go more than two days without a movement. Do not use MiraLax more than once per day. Call your doctor if you are still constipated or irregular after using this medication for 7 days in a row.  If you continue to have problems with postoperative constipation, please contact the office for further assistance and recommendations.  If you experience "the worst abdominal pain ever" or develop nausea or vomiting, please contact the office immediatly for further recommendations for treatment.  ITCHING  If you experience itching with your medications, try taking only a single pain pill, or even half a pain pill at a time.  You can also use Benadryl over the counter for itching or also to help with sleep.   TED HOSE STOCKINGS Wear the elastic stockings on both legs for three weeks following surgery during the day but you may remove then at night for sleeping.  MEDICATIONS See your medication summary on the "After Visit Summary" that the nursing staff will  review with you prior to discharge.  You may have some home medications which will be placed on hold until you complete the course of blood thinner medication.  It is important for you to complete the blood thinner medication as prescribed by your surgeon.  Continue your approved medications as instructed at time of discharge.  PRECAUTIONS If you experience chest pain or shortness of breath - call 911 immediately for transfer to the hospital emergency department.  If you develop a fever greater that 101 F, purulent drainage from wound, increased redness or drainage from wound, foul odor from the wound/dressing, or calf pain - CONTACT YOUR SURGEON.                                                   FOLLOW-UP APPOINTMENTS Make sure you keep all of your appointments after your operation with your surgeon and caregivers. You should call the office at the above phone number and make an appointment for approximately two weeks after the date of your surgery or on the date instructed by your surgeon outlined in the "After Visit Summary".  RANGE OF MOTION AND STRENGTHENING EXERCISES  These exercises are designed to help you keep full movement of your hip joint. Follow your caregiver's or physical therapist's instructions. Perform all exercises about fifteen times, three times per day or as directed. Exercise both hips, even if you have had only one joint replacement. These exercises can be done on a training (exercise) mat, on the floor, on a table or on a bed. Use whatever works the best and is most comfortable for you. Use music or television while you are exercising so that the exercises are a pleasant break in your day. This will  make your life better with the exercises acting as a break in routine you can look forward to.  Lying on your back, slowly slide your foot toward your buttocks, raising your knee up off the floor. Then slowly slide your foot back down until your leg is straight again.  Lying on your  back spread your legs as far apart as you can without causing discomfort.  Lying on your side, raise your upper leg and foot straight up from the floor as far as is comfortable. Slowly lower the leg and repeat.  Lying on your back, tighten up the muscle in the front of your thigh (quadriceps muscles). You can do this by keeping your leg straight and trying to raise your heel off the floor. This helps strengthen the largest muscle supporting your knee.  Lying on your back, tighten up the muscles of your buttocks both with the legs straight and with the knee bent at a comfortable angle while keeping your heel on the floor.   IF YOU ARE TRANSFERRED TO A SKILLED REHAB FACILITY If the patient is transferred to a skilled rehab facility following release from the hospital, a list of the current medications will be sent to the facility for the patient to continue.  When discharged from the skilled rehab facility, please have the facility set up the patient's [place] prior to being released. Also, the skilled facility will be responsible for providing the patient with their medications at time of release from the facility to include their pain medication, the muscle relaxants, and their blood thinner medication. If the patient is still at the rehab facility at time of the two week follow up appointment, the skilled rehab facility will also need to assist the patient in arranging follow up appointment in our office and any transportation needs.  MAKE SURE YOU:  Understand these instructions.  Get help right away if you are not doing well or get worse.    Pick up stool softner and laxative for home use following surgery while on pain medications. Do not submerge incision under water. Please use good hand washing techniques while changing dressing each day. May shower starting three days after surgery. Please use a clean towel to pat the incision dry following showers. Continue to use ice  for pain and swelling after surgery. Do not use any lotions or creams on the incision until instructed by your surgeon.   Do not sit on low chairs, stoools or toilet seats, as it may be difficult to get up from low surfaces   Complete by:  As directed    Driving restrictions   Complete by:  As directed    No driving for two weeks   TED hose   Complete by:  As directed    Use stockings (TED hose) for three weeks on both leg(s).  You may remove them at night for sleeping.   Weight bearing as tolerated   Complete by:  As directed      Allergies as of 06/20/2018      Reactions   Levaquin [levofloxacin] Rash   Difficulty breathing, mouth swelling/rash   Fish Oil Nausea And Vomiting   Compazine [prochlorperazine Edisylate]    Muscle twitching   Droperidol    uncontrolled muscle movements    Imitrex [sumatriptan]    Difficulty breathing, chest pain    Reglan [metoclopramide]    Uncontrolled twitching   Sulfa Antibiotics Hives   Hives   Thorazine [chlorpromazine]  Uncontrolled muscle twitching      Medication List    STOP taking these medications   acetaminophen 325 MG tablet Commonly known as:  TYLENOL   celecoxib 100 MG capsule Commonly known as:  CELEBREX   cyclobenzaprine 10 MG tablet Commonly known as:  FLEXERIL     TAKE these medications   aspirin 325 MG EC tablet Take 1 tablet (325 mg total) by mouth 2 (two) times daily for 19 days. Take one tablet (325 mg) Aspirin two times a day for three weeks following surgery. Then take one baby Aspirin (81 mg) once a day for three weeks. Then discontinue aspirin.   atorvastatin 10 MG tablet Commonly known as:  LIPITOR Take 10 mg by mouth at bedtime.   buPROPion 100 MG tablet Commonly known as:  WELLBUTRIN Take 100 mg by mouth at bedtime.   buPROPion 150 MG 12 hr tablet Commonly known as:  ZYBAN Take 150 mg by mouth 2 (two) times daily.   busPIRone 15 MG tablet Commonly known as:  BUSPAR Take 15 mg by mouth 2  (two) times daily.   Butalbital-APAP 50-325 MG Tabs Take 1 tablet by mouth as needed.   citalopram 40 MG tablet Commonly known as:  CELEXA Take 40 mg by mouth daily.   DEXILANT 60 MG capsule Generic drug:  dexlansoprazole Take 60 mg by mouth daily.   dicyclomine 20 MG tablet Commonly known as:  BENTYL Take 40 mg by mouth 2 (two) times daily.   GENVOYA 150-150-200-10 MG Tabs tablet Generic drug:  elvitegravir-cobicistat-emtricitabine-tenofovir TAKE 1 TABLET BY MOUTH EVERY DAY WITH BREAKFAST What changed:  See the new instructions.   HYDROcodone-acetaminophen 5-325 MG tablet Commonly known as:  NORCO/VICODIN Take 1-2 tablets by mouth every 6 (six) hours as needed for moderate pain (pain score 4-6).   hydrOXYzine 25 MG tablet Commonly known as:  ATARAX/VISTARIL Take 50 mg by mouth every 8 (eight) hours as needed.   loperamide 2 MG tablet Commonly known as:  IMODIUM A-D Take 2 mg by mouth as needed for diarrhea or loose stools.   methocarbamol 500 MG tablet Commonly known as:  ROBAXIN Take 1 tablet (500 mg total) by mouth every 6 (six) hours as needed for muscle spasms.   ondansetron 8 MG tablet Commonly known as:  ZOFRAN Take by mouth every 8 (eight) hours as needed for nausea or vomiting.   ranitidine 150 MG tablet Commonly known as:  ZANTAC Take 150 mg by mouth 2 (two) times daily.   traMADol 50 MG tablet Commonly known as:  ULTRAM TAKE ONE TABLET BY MOUTH EVERY 12 HOURS AS NEEDED   traZODone 100 MG tablet Commonly known as:  DESYREL Take 100-200 mg by mouth at bedtime.            Discharge Care Instructions  (From admission, onward)        Start     Ordered   06/19/18 0000  Weight bearing as tolerated     06/19/18 0725   06/19/18 0000  Change dressing    Comments:  You may change your dressing on Friday (06/20/18), then change the dressing daily with sterile 4 x 4 inch gauze dressing and paper tape.  You may clean the incision with alcohol prior to  redressing   06/19/18 0725     Follow-up Information    Gaynelle Arabian, MD Follow up on 07/01/2018.   Specialty:  Orthopedic Surgery Contact information: [address] [place] [address] [phone]  Signed: Theresa Duty, PA-C Orthopaedic Surgery 06/23/2018, 9:04 AM

## 2018-06-24 DIAGNOSIS — Z7982 Long term (current) use of aspirin: Secondary | ICD-10-CM | POA: Diagnosis not present

## 2018-06-24 DIAGNOSIS — G43909 Migraine, unspecified, not intractable, without status migrainosus: Secondary | ICD-10-CM | POA: Diagnosis not present

## 2018-06-24 DIAGNOSIS — M1712 Unilateral primary osteoarthritis, left knee: Secondary | ICD-10-CM | POA: Diagnosis not present

## 2018-06-24 DIAGNOSIS — M5126 Other intervertebral disc displacement, lumbar region: Secondary | ICD-10-CM | POA: Diagnosis not present

## 2018-06-24 DIAGNOSIS — Z96643 Presence of artificial hip joint, bilateral: Secondary | ICD-10-CM | POA: Diagnosis not present

## 2018-06-24 DIAGNOSIS — Z9181 History of falling: Secondary | ICD-10-CM | POA: Diagnosis not present

## 2018-06-24 DIAGNOSIS — Z471 Aftercare following joint replacement surgery: Secondary | ICD-10-CM | POA: Diagnosis not present

## 2018-06-24 DIAGNOSIS — E7800 Pure hypercholesterolemia, unspecified: Secondary | ICD-10-CM | POA: Diagnosis not present

## 2018-06-24 DIAGNOSIS — K219 Gastro-esophageal reflux disease without esophagitis: Secondary | ICD-10-CM | POA: Diagnosis not present

## 2018-06-24 DIAGNOSIS — M4807 Spinal stenosis, lumbosacral region: Secondary | ICD-10-CM | POA: Diagnosis not present

## 2018-06-24 DIAGNOSIS — G4700 Insomnia, unspecified: Secondary | ICD-10-CM | POA: Diagnosis not present

## 2018-06-24 DIAGNOSIS — D649 Anemia, unspecified: Secondary | ICD-10-CM | POA: Diagnosis not present

## 2018-06-26 DIAGNOSIS — G4700 Insomnia, unspecified: Secondary | ICD-10-CM | POA: Diagnosis not present

## 2018-06-26 DIAGNOSIS — K219 Gastro-esophageal reflux disease without esophagitis: Secondary | ICD-10-CM | POA: Diagnosis not present

## 2018-06-26 DIAGNOSIS — M4807 Spinal stenosis, lumbosacral region: Secondary | ICD-10-CM | POA: Diagnosis not present

## 2018-06-26 DIAGNOSIS — M5126 Other intervertebral disc displacement, lumbar region: Secondary | ICD-10-CM | POA: Diagnosis not present

## 2018-06-26 DIAGNOSIS — Z96643 Presence of artificial hip joint, bilateral: Secondary | ICD-10-CM | POA: Diagnosis not present

## 2018-06-26 DIAGNOSIS — Z7982 Long term (current) use of aspirin: Secondary | ICD-10-CM | POA: Diagnosis not present

## 2018-06-26 DIAGNOSIS — G43909 Migraine, unspecified, not intractable, without status migrainosus: Secondary | ICD-10-CM | POA: Diagnosis not present

## 2018-06-26 DIAGNOSIS — E7800 Pure hypercholesterolemia, unspecified: Secondary | ICD-10-CM | POA: Diagnosis not present

## 2018-06-26 DIAGNOSIS — Z9181 History of falling: Secondary | ICD-10-CM | POA: Diagnosis not present

## 2018-06-26 DIAGNOSIS — M1712 Unilateral primary osteoarthritis, left knee: Secondary | ICD-10-CM | POA: Diagnosis not present

## 2018-06-26 DIAGNOSIS — D649 Anemia, unspecified: Secondary | ICD-10-CM | POA: Diagnosis not present

## 2018-06-26 DIAGNOSIS — Z471 Aftercare following joint replacement surgery: Secondary | ICD-10-CM | POA: Diagnosis not present

## 2018-06-30 DIAGNOSIS — Z96643 Presence of artificial hip joint, bilateral: Secondary | ICD-10-CM | POA: Diagnosis not present

## 2018-06-30 DIAGNOSIS — Z471 Aftercare following joint replacement surgery: Secondary | ICD-10-CM | POA: Diagnosis not present

## 2018-06-30 DIAGNOSIS — M5126 Other intervertebral disc displacement, lumbar region: Secondary | ICD-10-CM | POA: Diagnosis not present

## 2018-06-30 DIAGNOSIS — G43909 Migraine, unspecified, not intractable, without status migrainosus: Secondary | ICD-10-CM | POA: Diagnosis not present

## 2018-06-30 DIAGNOSIS — K219 Gastro-esophageal reflux disease without esophagitis: Secondary | ICD-10-CM | POA: Diagnosis not present

## 2018-06-30 DIAGNOSIS — Z7982 Long term (current) use of aspirin: Secondary | ICD-10-CM | POA: Diagnosis not present

## 2018-06-30 DIAGNOSIS — M1712 Unilateral primary osteoarthritis, left knee: Secondary | ICD-10-CM | POA: Diagnosis not present

## 2018-06-30 DIAGNOSIS — G4700 Insomnia, unspecified: Secondary | ICD-10-CM | POA: Diagnosis not present

## 2018-06-30 DIAGNOSIS — D649 Anemia, unspecified: Secondary | ICD-10-CM | POA: Diagnosis not present

## 2018-06-30 DIAGNOSIS — Z9181 History of falling: Secondary | ICD-10-CM | POA: Diagnosis not present

## 2018-06-30 DIAGNOSIS — M4807 Spinal stenosis, lumbosacral region: Secondary | ICD-10-CM | POA: Diagnosis not present

## 2018-06-30 DIAGNOSIS — E7800 Pure hypercholesterolemia, unspecified: Secondary | ICD-10-CM | POA: Diagnosis not present

## 2018-07-02 DIAGNOSIS — K219 Gastro-esophageal reflux disease without esophagitis: Secondary | ICD-10-CM | POA: Diagnosis not present

## 2018-07-02 DIAGNOSIS — Z9181 History of falling: Secondary | ICD-10-CM | POA: Diagnosis not present

## 2018-07-02 DIAGNOSIS — M1712 Unilateral primary osteoarthritis, left knee: Secondary | ICD-10-CM | POA: Diagnosis not present

## 2018-07-02 DIAGNOSIS — Z96643 Presence of artificial hip joint, bilateral: Secondary | ICD-10-CM | POA: Diagnosis not present

## 2018-07-02 DIAGNOSIS — D649 Anemia, unspecified: Secondary | ICD-10-CM | POA: Diagnosis not present

## 2018-07-02 DIAGNOSIS — Z7982 Long term (current) use of aspirin: Secondary | ICD-10-CM | POA: Diagnosis not present

## 2018-07-02 DIAGNOSIS — M4807 Spinal stenosis, lumbosacral region: Secondary | ICD-10-CM | POA: Diagnosis not present

## 2018-07-02 DIAGNOSIS — G43909 Migraine, unspecified, not intractable, without status migrainosus: Secondary | ICD-10-CM | POA: Diagnosis not present

## 2018-07-02 DIAGNOSIS — E7800 Pure hypercholesterolemia, unspecified: Secondary | ICD-10-CM | POA: Diagnosis not present

## 2018-07-02 DIAGNOSIS — G4700 Insomnia, unspecified: Secondary | ICD-10-CM | POA: Diagnosis not present

## 2018-07-02 DIAGNOSIS — M5126 Other intervertebral disc displacement, lumbar region: Secondary | ICD-10-CM | POA: Diagnosis not present

## 2018-07-02 DIAGNOSIS — Z471 Aftercare following joint replacement surgery: Secondary | ICD-10-CM | POA: Diagnosis not present

## 2018-07-17 ENCOUNTER — Ambulatory Visit: Payer: Medicare Other | Admitting: Infectious Diseases

## 2018-07-23 ENCOUNTER — Ambulatory Visit: Payer: Medicare Other | Admitting: Infectious Diseases

## 2018-08-05 DIAGNOSIS — Z131 Encounter for screening for diabetes mellitus: Secondary | ICD-10-CM | POA: Diagnosis not present

## 2018-08-05 DIAGNOSIS — K210 Gastro-esophageal reflux disease with esophagitis: Secondary | ICD-10-CM | POA: Diagnosis not present

## 2018-08-05 DIAGNOSIS — Z0000 Encounter for general adult medical examination without abnormal findings: Secondary | ICD-10-CM | POA: Diagnosis not present

## 2018-08-05 DIAGNOSIS — F330 Major depressive disorder, recurrent, mild: Secondary | ICD-10-CM | POA: Diagnosis not present

## 2018-08-05 DIAGNOSIS — M25552 Pain in left hip: Secondary | ICD-10-CM | POA: Diagnosis not present

## 2018-08-05 DIAGNOSIS — Z1389 Encounter for screening for other disorder: Secondary | ICD-10-CM | POA: Diagnosis not present

## 2018-08-05 DIAGNOSIS — B20 Human immunodeficiency virus [HIV] disease: Secondary | ICD-10-CM | POA: Diagnosis not present

## 2018-08-05 DIAGNOSIS — E782 Mixed hyperlipidemia: Secondary | ICD-10-CM | POA: Diagnosis not present

## 2018-08-05 DIAGNOSIS — G43009 Migraine without aura, not intractable, without status migrainosus: Secondary | ICD-10-CM | POA: Diagnosis not present

## 2018-08-07 DIAGNOSIS — Z96642 Presence of left artificial hip joint: Secondary | ICD-10-CM | POA: Diagnosis not present

## 2018-08-07 DIAGNOSIS — M25562 Pain in left knee: Secondary | ICD-10-CM | POA: Diagnosis not present

## 2018-09-11 ENCOUNTER — Ambulatory Visit: Payer: Medicare Other | Admitting: Infectious Diseases

## 2018-09-16 ENCOUNTER — Ambulatory Visit: Payer: Medicare Other | Admitting: Infectious Diseases

## 2018-09-30 ENCOUNTER — Ambulatory Visit: Payer: Medicare Other

## 2018-10-02 ENCOUNTER — Ambulatory Visit: Payer: Medicare Other

## 2018-10-13 ENCOUNTER — Ambulatory Visit (INDEPENDENT_AMBULATORY_CARE_PROVIDER_SITE_OTHER): Payer: Medicare Other | Admitting: Pharmacist

## 2018-10-13 ENCOUNTER — Ambulatory Visit (INDEPENDENT_AMBULATORY_CARE_PROVIDER_SITE_OTHER): Payer: Medicare Other | Admitting: Infectious Diseases

## 2018-10-13 ENCOUNTER — Other Ambulatory Visit (HOSPITAL_COMMUNITY)
Admission: RE | Admit: 2018-10-13 | Discharge: 2018-10-13 | Disposition: A | Discharge status: Home / Self Care | Payer: Medicare Other | Source: Ambulatory Visit | Attending: Infectious Diseases | Admitting: Infectious Diseases

## 2018-10-13 ENCOUNTER — Encounter: Payer: Self-pay | Admitting: Infectious Diseases

## 2018-10-13 VITALS — BP 128/85 | HR 74 | Temp 97.8°F | Ht 61.0 in | Wt 181.0 lb

## 2018-10-13 DIAGNOSIS — K219 Gastro-esophageal reflux disease without esophagitis: Secondary | ICD-10-CM | POA: Diagnosis not present

## 2018-10-13 DIAGNOSIS — G8929 Other chronic pain: Secondary | ICD-10-CM | POA: Insufficient documentation

## 2018-10-13 DIAGNOSIS — N9089 Other specified noninflammatory disorders of vulva and perineum: Secondary | ICD-10-CM

## 2018-10-13 DIAGNOSIS — Z124 Encounter for screening for malignant neoplasm of cervix: Secondary | ICD-10-CM | POA: Insufficient documentation

## 2018-10-13 DIAGNOSIS — Z23 Encounter for immunization: Secondary | ICD-10-CM | POA: Diagnosis not present

## 2018-10-13 DIAGNOSIS — Z1272 Encounter for screening for malignant neoplasm of vagina: Secondary | ICD-10-CM | POA: Diagnosis not present

## 2018-10-13 DIAGNOSIS — Z21 Asymptomatic human immunodeficiency virus [HIV] infection status: Secondary | ICD-10-CM | POA: Diagnosis not present

## 2018-10-13 DIAGNOSIS — G43909 Migraine, unspecified, not intractable, without status migrainosus: Secondary | ICD-10-CM | POA: Diagnosis not present

## 2018-10-13 DIAGNOSIS — E7800 Pure hypercholesterolemia, unspecified: Secondary | ICD-10-CM | POA: Diagnosis not present

## 2018-10-13 DIAGNOSIS — M25552 Pain in left hip: Secondary | ICD-10-CM | POA: Diagnosis not present

## 2018-10-13 DIAGNOSIS — Z79899 Other long term (current) drug therapy: Secondary | ICD-10-CM | POA: Insufficient documentation

## 2018-10-13 DIAGNOSIS — B1920 Unspecified viral hepatitis C without hepatic coma: Secondary | ICD-10-CM | POA: Insufficient documentation

## 2018-10-13 DIAGNOSIS — M1610 Unilateral primary osteoarthritis, unspecified hip: Secondary | ICD-10-CM | POA: Diagnosis not present

## 2018-10-13 DIAGNOSIS — F419 Anxiety disorder, unspecified: Secondary | ICD-10-CM | POA: Insufficient documentation

## 2018-10-13 DIAGNOSIS — F4310 Post-traumatic stress disorder, unspecified: Secondary | ICD-10-CM | POA: Insufficient documentation

## 2018-10-13 MED ORDER — CLOBETASOL PROPIONATE 0.05 % EX OINT: 1.0000 "application " | TOPICAL_OINTMENT | Freq: Two times a day (BID) | CUTANEOUS | 2 refills | Status: DC

## 2018-10-13 MED ORDER — BICTEGRAVIR-EMTRICITAB-TENOFOV 50-200-25 MG PO TABS: 1.0000 | ORAL_TABLET | Freq: Every day | ORAL | 2 refills | Status: DC

## 2018-10-13 NOTE — Patient Instructions (Signed)
It was great to see you today.   Stop taking Genvoya and start taking Biktarvy daily.   Follow up in about 6 weeks.

## 2018-10-13 NOTE — Progress Notes (Signed)
Patient Name: Laura Jones  Date of Birth: 10/03/1971 MRN: [id number]  PCP: Toma Deiters, MD   Patient Active Problem List   Diagnosis Date Noted  . Vulvar lesion 10/14/2018  . OA (osteoarthritis) of hip 06/18/2018  . Hypercholesteremia 12/08/2017  . Hx of hepatitis C 07/30/2017  . Acid reflux 07/30/2017  . HIV (human immunodeficiency virus infection) (HCC) 07/29/2017  . Screening for vaginal cancer 07/29/2017   SUBJECTIVE:  Brief Narrative:   Sreya is a 47 y.o. caucasian female with HIV infection. She currently lives in [place], [place] after relocating from [place] in 2018 where she was in good care at [facility]. She has numerous other health conditions including history of hep c infection (s/p treatment). Has had HIV "a long time."  History of OIs: none known. HIV Risk: heterosexual contact  Previous Regimens:   Genvoya >> suppressed   Biktarvy 09/2018 >> switched d/t DDI with medications  Genotype:   K103N - NNRTI resistance per chart records   CC:  Omayra M Elsey is here today for follow up on her HIV and questions about vulvar HPV.   HPI/ROS:   Chelsa is here for follow up visit with pharmacy after missing several appointments with me in the setting of left total hip arthroplasty and recovery. She reports she is doing very well following the surgery and now does not need any walking aid and pain is completely gone. She is very pleased with results and healed well following surgery. She has been taking her Genvoya daily since our last office visit. We have updated her medications and medical history today to reflect interval history. No concerns with HIV medications or access to medications. She was switched to [place] today to minimize her DDI on cobicistat component of Genvoya.   She back in May of this year was evaluated by another provider when she presented with a vulvar lesion she thought was a wart located in perineal region. She has a  strong history of previous needs for LEEP and colposcopies per her report and is now s/p hysterectomy (done for fibroids/bleeding). She is upset as she has not been sexually active in over 7 years now and does not understand how this is a new wart. She was given some cream and she only used it twice but stopped due to burning and concern for allergy to medication. She reports she has some pain at the lesion site; small bump but mostly feels flat. No bleeding. Has not gotten a great look at it due to location.   Review of Systems  Constitutional: Negative for chills, fever, malaise/fatigue and weight loss.  HENT: Negative for sore throat.        No dental problems  Respiratory: Negative for cough and sputum production.   Cardiovascular: Negative for chest pain and leg swelling.  Gastrointestinal: Negative for abdominal pain, diarrhea and vomiting.  Genitourinary: Negative for dysuria and flank pain.       Vulvar lesion as described above  Musculoskeletal: Negative for joint pain, myalgias and neck pain.  Skin: Negative for rash.  Neurological: Negative for dizziness, tingling and headaches.  Psychiatric/Behavioral: Negative for depression. The patient is not nervous/anxious and does not have insomnia.     Past Medical History:  Diagnosis Date  . Anemia    hx of   . Anxiety   . Arthritis    left knee  . Avascular necrosis of bone of hip, left (HCC)   . Bulging lumbar disc   .  Depression   . Dyspnea    with excertion  . GERD (gastroesophageal reflux disease)   . Hepatitis    C treated 2 years ago went undetected  . History of kidney stones   . HIV (human immunodeficiency virus infection) (HCC) 07/29/2017  . HIV infection (HCC)   . Insomnia   . Migraines   . Nerve pain   . Perimenopausal   . Porphyria (HCC)   . PTSD (post-traumatic stress disorder)   . Spinal stenosis of lumbosacral region   . Tendonitis    right arm     Social History   Tobacco Use  . Smoking status: Never  Smoker  . Smokeless tobacco: Never Used  Substance Use Topics  . Alcohol use: No  . Drug use: No    Family History  Problem Relation Age of Onset  . COPD Mother   . Osteoporosis Mother   . Hypertension Father   . Healthy Sister   . Heart disease Maternal Grandmother   . Heart disease Maternal Grandfather     Allergies  Allergen Reactions  . Levaquin [Levofloxacin] Rash    Difficulty breathing, mouth swelling/rash  . Fish Oil Nausea And Vomiting  . Compazine [Prochlorperazine Edisylate]     Muscle twitching  . Droperidol     uncontrolled muscle movements   . Imitrex [Sumatriptan]     Difficulty breathing, chest pain   . Reglan [Metoclopramide]     Uncontrolled twitching  . Sulfa Antibiotics Hives    Hives   . Thorazine [Chlorpromazine]     Uncontrolled muscle twitching    Objective:  Vitals:   10/13/18 1203  BP: 128/85  Pulse: 74  Temp: 97.8 F (36.6 C)  TempSrc: Oral  Weight: 181 lb (82.1 kg)  Height: 5\' 1"  (1.549 m)   Body mass index is 34.2 kg/m.  Physical Exam  Constitutional: She is oriented to person, place, and time and well-developed, well-nourished, and in no distress. No distress.  Seated comfortably in chair today. Appears well.   HENT:  Mouth/Throat: Oropharynx is clear and moist. No oral lesions. No dental abscesses.  Missing teeth   Eyes: No scleral icterus.  Cardiovascular: Normal rate, regular rhythm and normal heart sounds.  Pulmonary/Chest: Effort normal and breath sounds normal.  Abdominal: Soft. She exhibits no distension. There is no tenderness.  Genitourinary: Right adnexa normal and left adnexa normal. Rectal exam shows no external hemorrhoid. Vulva exhibits rash. Vagina exhibits normal mucosa. Thin  odorless  white and vaginal discharge found.  Genitourinary Comments: Two confluent macules at inferior labia minora/introitus. Hypopigmented, irregular texture but in general flat with irregular boarders.  Speculum exam very painful  for her and inability to fully open vaginal canal with discomfort. Cervix surgically absent. Uterus surgically absent.   Musculoskeletal: She exhibits no tenderness.       Left hip: She exhibits normal range of motion and no tenderness.  Ambulating independently w/o walking aid. Smooth gait and no limp.   Lymphadenopathy:    She has no cervical adenopathy.  Neurological: She is alert and oriented to person, place, and time.  Skin: Skin is warm and dry. No rash noted.  Psychiatric: Mood, affect and judgment normal.  Vitals reviewed.  Lab Results Lab Results  Component Value Date   WBC 8.1 06/20/2018   HGB 9.5 (L) 06/20/2018   HCT 28.8 (L) 06/20/2018   MCV 102.1 (H) 06/20/2018   PLT 148 (L) 06/20/2018    Lab Results  Component Value Date  CREATININE 0.82 06/20/2018   BUN 13 06/20/2018   NA 142 06/20/2018   K 4.2 06/20/2018   CL 107 06/20/2018   CO2 26 06/20/2018    Lab Results  Component Value Date   ALT 19 06/16/2018   AST 21 06/16/2018   ALKPHOS 81 06/16/2018   BILITOT 0.2 (L) 06/16/2018    Lab Results  Component Value Date   CHOL 240 (H) 07/29/2017   HDL 51 07/29/2017   LDLCALC 124 (H) 07/29/2017   TRIG 323 (H) 07/29/2017   CHOLHDL 4.7 07/29/2017   HIV 1 RNA Quant (copies/mL)  Date Value  12/05/2017 <20 DETECTED (A)  07/29/2017 <20 NOT DETECTED   CD4 T Cell Abs (/uL)  Date Value  10/13/2018 350 (L)  12/05/2017 570  07/29/2017 400   Lab Results  Component Value Date   HAV REACTIVE (A) 07/29/2017   Lab Results  Component Value Date   HEPBSAG NON-REACTIVE 07/29/2017   HEPBSAB NON-REACTIVE 07/29/2017   No results found for: HCVAB No results found for: CHLAMYDIAWP, N No results found for: GCPROBEAPT Lab Results  Component Value Date   QUANTGOLD NEGATIVE 07/29/2017      Problem List Items Addressed This Visit      Unprioritized   RESOLVED: Chronic left hip pain    Improved following THR.       HIV (human immunodeficiency virus infection)  (HCC) (Chronic)    Agree and appreciate pharmacy team changing her to [facility] to minimize DDI with other medications (of which she is on many).  Will check VL/CD4 today. She is not currently sexually active and declined condoms. She can follow up again in 2 months considering medication switch to recheck VL and ensure tolerating this well.         Screening for vaginal cancer    With history of significant intervention for dysplasia of the cervix will resume vaginal pap smears for her especially in consideration of current symptoms. There is no abnormalities or defects/growths in the vaginal vault however with some discharge and pain will check STI panel today as well as cytology and HR-HPV. Will call her with results and next steps.       Vulvar lesion    Lesion is not consistent with HPV condyloma, candidiasis, furuncle or other bacterial infection process. Considering the pain at the site and appearance I believe this is most c/w be lichen sclerosus. I asked her to please stop the cream for HPV (presumably imiquimod). Will trial a topical clobetasol to see if this provides any improvement. We also discussed referral to GYN however given the higher risk of SCC if this is lichen sclerosus dermatology may be more fitting for diagnosis/management.        Other Visit Diagnoses    Screening for cervical cancer    -  Primary   Relevant Orders   Cytology - PAP( [facility])   Vulvar rash       Relevant Orders   Ambulatory referral to Gynecology     Follow up in 2-3 months with myself or pharmacy to follow up medication switch.   Rexene Alberts, MSN, NP-C [facility]   10/14/18 11:01 PM

## 2018-10-13 NOTE — Progress Notes (Signed)
HPI: Laura Jones is a 47 y.o. female presents to [facility] for follow up of HIV treatment.   Patient Active Problem List   Diagnosis Date Noted  . OA (osteoarthritis) of hip 06/18/2018  . Chronic left hip pain 12/08/2017  . Hypercholesteremia 12/08/2017  . Hx of hepatitis C 07/30/2017  . Acid reflux 07/30/2017  . HIV (human immunodeficiency virus infection) (HCC) 07/29/2017  . Healthcare maintenance 07/29/2017    Patient's Medications  New Prescriptions   No medications on file  Previous Medications   ATORVASTATIN (LIPITOR) 10 MG TABLET    Take 10 mg by mouth at bedtime.   BUPROPION (WELLBUTRIN) 100 MG TABLET    Take 100 mg by mouth at bedtime.   BUPROPION (ZYBAN) 150 MG 12 HR TABLET    Take 150 mg by mouth 2 (two) times daily.   BUSPIRONE (BUSPAR) 15 MG TABLET    Take 15 mg by mouth 2 (two) times daily.   BUTALBITAL-ACETAMINOPHEN (BUTALBITAL-APAP) 50-325 MG TABS    Take 1 tablet by mouth as needed.   CITALOPRAM (CELEXA) 40 MG TABLET    Take 40 mg by mouth daily.   DEXLANSOPRAZOLE (DEXILANT) 60 MG CAPSULE    Take 60 mg by mouth daily.   DICYCLOMINE (BENTYL) 20 MG TABLET    Take 40 mg by mouth 2 (two) times daily.   GENVOYA 150-150-200-10 MG TABS TABLET    TAKE 1 TABLET BY MOUTH EVERY DAY WITH BREAKFAST   HYDROCODONE-ACETAMINOPHEN (NORCO/VICODIN) 5-325 MG TABLET    Take 1-2 tablets by mouth every 6 (six) hours as needed for moderate pain (pain score 4-6).   HYDROXYZINE (ATARAX/VISTARIL) 25 MG TABLET    Take 50 mg by mouth every 8 (eight) hours as needed.   LOPERAMIDE (IMODIUM A-D) 2 MG TABLET    Take 2 mg by mouth as needed for diarrhea or loose stools.   METHOCARBAMOL (ROBAXIN) 500 MG TABLET    Take 1 tablet (500 mg total) by mouth every 6 (six) hours as needed for muscle spasms.   ONDANSETRON (ZOFRAN) 8 MG TABLET    Take by mouth every 8 (eight) hours as needed for nausea or vomiting.   RANITIDINE (ZANTAC) 150 MG TABLET    Take 150 mg by mouth 2 (two) times daily.   TRAMADOL (ULTRAM) 50 MG TABLET    TAKE ONE TABLET BY MOUTH EVERY 12 HOURS AS NEEDED   TRAZODONE (DESYREL) 100 MG TABLET    Take 100-200 mg by mouth at bedtime.  Modified Medications   No medications on file  Discontinued Medications   No medications on file    Allergies: Allergies  Allergen Reactions  . Levaquin [Levofloxacin] Rash    Difficulty breathing, mouth swelling/rash  . Fish Oil Nausea And Vomiting  . Compazine [Prochlorperazine Edisylate]     Muscle twitching  . Droperidol     uncontrolled muscle movements   . Imitrex [Sumatriptan]     Difficulty breathing, chest pain   . Reglan [Metoclopramide]     Uncontrolled twitching  . Sulfa Antibiotics Hives    Hives   . Thorazine [Chlorpromazine]     Uncontrolled muscle twitching    Past Medical History: Past Medical History:  Diagnosis Date  . Anemia    hx of   . Anxiety   . Arthritis    left knee  . Avascular necrosis of bone of hip, left (HCC)   . Bulging lumbar disc   . Depression   . Dyspnea  with excertion  . GERD (gastroesophageal reflux disease)   . Hepatitis    C treated 2 years ago went undetected  . History of kidney stones   . HIV (human immunodeficiency virus infection) (HCC) 07/29/2017  . HIV infection (HCC)   . Insomnia   . Migraines   . Nerve pain   . Perimenopausal   . Porphyria (HCC)   . PTSD (post-traumatic stress disorder)   . Spinal stenosis of lumbosacral region   . Tendonitis    right arm     Social History: Social History   Socioeconomic History  . Marital status: Legally Separated    Spouse name: Not on file  . Number of children: Not on file  . Years of education: Not on file  . Highest education level: Not on file  Occupational History  . Not on file  Social Needs  . Financial resource strain: Not on file  . Food insecurity:    Worry: Not on file    Inability: Not on file  . Transportation needs:    Medical: Not on file    Non-medical: Not on file  Tobacco Use    . Smoking status: Never Smoker  . Smokeless tobacco: Never Used  Substance and Sexual Activity  . Alcohol use: No  . Drug use: No  . Sexual activity: Not Currently  Lifestyle  . Physical activity:    Days per week: Not on file    Minutes per session: Not on file  . Stress: Not on file  Relationships  . Social connections:    Talks on phone: Not on file    Gets together: Not on file    Attends religious service: Not on file    Active member of club or organization: Not on file    Attends meetings of clubs or organizations: Not on file    Relationship status: Not on file  Other Topics Concern  . Not on file  Social History Narrative  . Not on file    Labs: Lab Results  Component Value Date   HIV1RNAQUANT <20 DETECTED (A) 12/05/2017   HIV1RNAQUANT <20 NOT DETECTED 07/29/2017   CD4TABS 570 12/05/2017   CD4TABS 400 07/29/2017    RPR and STI No results found for: LABRPR, RPRTITER  No flowsheet data found.  Hepatitis B Lab Results  Component Value Date   HEPBSAB NON-REACTIVE 07/29/2017   HEPBSAG NON-REACTIVE 07/29/2017   Hepatitis C No results found for: HEPCAB, HCVRNAPCRQN Hepatitis A Lab Results  Component Value Date   HAV REACTIVE (A) 07/29/2017   Lipids: Lab Results  Component Value Date   CHOL 240 (H) 07/29/2017   TRIG 323 (H) 07/29/2017   HDL 51 07/29/2017   CHOLHDL 4.7 07/29/2017   VLDL 65 (H) 07/29/2017   LDLCALC 124 (H) 07/29/2017    Current HIV Regimen: Genvoya  Assessment: Laura Jones presents to clinic after having several recent no-shows. She has had a rough time recently with a hip replacement, then multiple illnesses including the flu and chronic migraines. She reports several missed doses of Genvoya since she has been sick and estimates 5-6 in the past 3 months. She reports most missed doses are due to nausea or not getting out of bed. She has started to take ondansetron ~30 minutes before taking Genvoya and reports this helps. Counseled  patient on the importance of daily adherence and the implications of resistance. She voiced understanding. She reports no side effects, other than nausea. Patient is agreeable to change to  Biktarvy to avoid drug interactions and possibly decrease nausea. Patient was counseled that this is a one pill once a day medication. Cautioned on possible side effects the first week or so including nausea, diarrhea, and headaches. I reviewed her medications and found no interactions.   Patient's medication list was updated. Patient received her influenza vaccination in office. She was unsure about her other vaccination history. I called patient's PCP which shows she received two doses of hepatitis B vaccine 03/2016 and 04/2016. The office also states she received PPSV 10/24/2016, however, says this is self reported and not administered in office. Will clarify with patient at 6 week visit as she likely needs Prevnar 13. Will obtain quanatitive HBV surface antibody for proof of Hep B immunity. Follow up with Cassie in 6 weeks to evaluate change in therapy.  Plan: - Stop Genvoya - Start Biktarvy  - HIV RNA, CD4 count, HBV surface antibody today  - Flu vaccine today  - f/u with Cassie in 6 weeks  Amanda Pea, Pharm D PGY1 Pharmacy Resident  10/13/2018      11:35 AM

## 2018-10-13 NOTE — Patient Instructions (Addendum)
I think what you may have is a condition called vulvar lichen planus/sclerosis. Will try a cream called clobetasol - this is a topical steroid to use.   Would also like to get you referred to gynecology to have their opinion also to ensure we are on the right track.

## 2018-10-14 DIAGNOSIS — D071 Carcinoma in situ of vulva: Secondary | ICD-10-CM | POA: Insufficient documentation

## 2018-10-14 DIAGNOSIS — N9089 Other specified noninflammatory disorders of vulva and perineum: Secondary | ICD-10-CM | POA: Insufficient documentation

## 2018-10-14 LAB — T-HELPER CELL (CD4) - (RCID CLINIC ONLY)
CD4 % Helper T Cell: 26 % — ABNORMAL LOW (ref 33–55)
CD4 T Cell Abs: 350 /uL — ABNORMAL LOW (ref 400–2700)

## 2018-10-14 NOTE — Assessment & Plan Note (Signed)
Improved following THR.

## 2018-10-14 NOTE — Assessment & Plan Note (Signed)
Agree and appreciate pharmacy team changing her to [facility] to minimize DDI with other medications (of which she is on many).  Will check VL/CD4 today. She is not currently sexually active and declined condoms. She can follow up again in 2 months considering medication switch to recheck VL and ensure tolerating this well.

## 2018-10-14 NOTE — Assessment & Plan Note (Signed)
With history of significant intervention for dysplasia of the cervix will resume vaginal pap smears for her especially in consideration of current symptoms. There is no abnormalities or defects/growths in the vaginal vault however with some discharge and pain will check STI panel today as well as cytology and HR-HPV. Will call her with results and next steps.

## 2018-10-14 NOTE — Assessment & Plan Note (Signed)
Lesion is not consistent with HPV condyloma, candidiasis, furuncle or other bacterial infection process. Considering the pain at the site and appearance I believe this is most c/w be lichen sclerosus. I asked her to please stop the cream for HPV (presumably imiquimod). Will trial a topical clobetasol to see if this provides any improvement. We also discussed referral to GYN however given the higher risk of SCC if this is lichen sclerosus dermatology may be more fitting for diagnosis/management.

## 2018-10-15 LAB — CYTOLOGY - PAP
Diagnosis: NEGATIVE
HPV: NOT DETECTED

## 2018-10-17 LAB — HIV RNA, RTPCR W/R GT (RTI, PI,INT)
HIV 1 RNA Quant: 55 copies/mL — ABNORMAL HIGH
HIV-1 RNA Quant, Log: 1.74 Log copies/mL — ABNORMAL HIGH

## 2018-10-17 LAB — HEPATITIS B SURFACE ANTIBODY, QUANTITATIVE: Hep B S AB Quant (Post): <5 m[IU]/mL — ABNORMAL LOW (ref >=10)

## 2018-11-24 ENCOUNTER — Encounter: Payer: Self-pay | Admitting: Obstetrics & Gynecology

## 2018-11-28 DIAGNOSIS — G43009 Migraine without aura, not intractable, without status migrainosus: Secondary | ICD-10-CM | POA: Diagnosis not present

## 2018-11-28 DIAGNOSIS — Z0000 Encounter for general adult medical examination without abnormal findings: Secondary | ICD-10-CM | POA: Diagnosis not present

## 2018-11-28 DIAGNOSIS — K210 Gastro-esophageal reflux disease with esophagitis: Secondary | ICD-10-CM | POA: Diagnosis not present

## 2018-12-01 ENCOUNTER — Ambulatory Visit: Payer: Medicare Other

## 2018-12-04 ENCOUNTER — Encounter: Payer: Self-pay | Admitting: Obstetrics & Gynecology

## 2018-12-10 ENCOUNTER — Ambulatory Visit: Payer: Medicare Other

## 2018-12-31 ENCOUNTER — Ambulatory Visit: Payer: Medicare Other

## 2018-12-31 DIAGNOSIS — S9032XA Contusion of left foot, initial encounter: Secondary | ICD-10-CM | POA: Diagnosis not present

## 2018-12-31 DIAGNOSIS — S92512A Displaced fracture of proximal phalanx of left lesser toe(s), initial encounter for closed fracture: Secondary | ICD-10-CM | POA: Diagnosis not present

## 2019-01-01 ENCOUNTER — Encounter: Payer: Self-pay | Admitting: Obstetrics & Gynecology

## 2019-01-08 ENCOUNTER — Ambulatory Visit: Payer: Medicare Other

## 2019-01-11 ENCOUNTER — Other Ambulatory Visit: Payer: Self-pay | Admitting: Pharmacist

## 2019-01-11 DIAGNOSIS — Z21 Asymptomatic human immunodeficiency virus [HIV] infection status: Secondary | ICD-10-CM

## 2019-01-12 ENCOUNTER — Telehealth: Payer: Self-pay

## 2019-01-12 NOTE — Telephone Encounter (Signed)
Attempted to call patient to schedule office visit after receiving refill for [facility]. Unable to reach patient at this time nor leave voicemail. Patient will need to schedule an appointment with Lab and Pharmacy.  Lorenso Courier, [place]

## 2019-01-13 ENCOUNTER — Encounter: Payer: Self-pay | Admitting: Obstetrics & Gynecology

## 2019-01-15 ENCOUNTER — Ambulatory Visit: Payer: Medicare Other

## 2019-01-19 DIAGNOSIS — R1011 Right upper quadrant pain: Secondary | ICD-10-CM | POA: Diagnosis not present

## 2019-01-26 DIAGNOSIS — Z96643 Presence of artificial hip joint, bilateral: Secondary | ICD-10-CM | POA: Diagnosis not present

## 2019-01-26 DIAGNOSIS — Z79899 Other long term (current) drug therapy: Secondary | ICD-10-CM | POA: Diagnosis not present

## 2019-01-26 DIAGNOSIS — E785 Hyperlipidemia, unspecified: Secondary | ICD-10-CM | POA: Diagnosis not present

## 2019-01-26 DIAGNOSIS — Z888 Allergy status to other drugs, medicaments and biological substances status: Secondary | ICD-10-CM | POA: Diagnosis not present

## 2019-01-26 DIAGNOSIS — Z883 Allergy status to other anti-infective agents status: Secondary | ICD-10-CM | POA: Diagnosis not present

## 2019-01-26 DIAGNOSIS — S82832A Other fracture of upper and lower end of left fibula, initial encounter for closed fracture: Secondary | ICD-10-CM | POA: Diagnosis not present

## 2019-01-26 DIAGNOSIS — Z8249 Family history of ischemic heart disease and other diseases of the circulatory system: Secondary | ICD-10-CM | POA: Diagnosis not present

## 2019-01-26 DIAGNOSIS — M25552 Pain in left hip: Secondary | ICD-10-CM | POA: Diagnosis not present

## 2019-01-26 DIAGNOSIS — W19XXXA Unspecified fall, initial encounter: Secondary | ICD-10-CM | POA: Diagnosis not present

## 2019-01-26 DIAGNOSIS — R52 Pain, unspecified: Secondary | ICD-10-CM | POA: Diagnosis not present

## 2019-01-26 DIAGNOSIS — S79912A Unspecified injury of left hip, initial encounter: Secondary | ICD-10-CM | POA: Diagnosis not present

## 2019-01-26 DIAGNOSIS — Z96642 Presence of left artificial hip joint: Secondary | ICD-10-CM | POA: Diagnosis not present

## 2019-01-26 DIAGNOSIS — K589 Irritable bowel syndrome without diarrhea: Secondary | ICD-10-CM | POA: Diagnosis not present

## 2019-01-26 DIAGNOSIS — R262 Difficulty in walking, not elsewhere classified: Secondary | ICD-10-CM | POA: Diagnosis not present

## 2019-01-26 DIAGNOSIS — S8252XA Displaced fracture of medial malleolus of left tibia, initial encounter for closed fracture: Secondary | ICD-10-CM | POA: Diagnosis not present

## 2019-01-26 DIAGNOSIS — S82842A Displaced bimalleolar fracture of left lower leg, initial encounter for closed fracture: Secondary | ICD-10-CM | POA: Diagnosis not present

## 2019-01-26 DIAGNOSIS — Z882 Allergy status to sulfonamides status: Secondary | ICD-10-CM | POA: Diagnosis not present

## 2019-01-26 DIAGNOSIS — S82852A Displaced trimalleolar fracture of left lower leg, initial encounter for closed fracture: Secondary | ICD-10-CM | POA: Diagnosis not present

## 2019-01-26 DIAGNOSIS — K219 Gastro-esophageal reflux disease without esophagitis: Secondary | ICD-10-CM | POA: Diagnosis not present

## 2019-01-26 DIAGNOSIS — W010XXA Fall on same level from slipping, tripping and stumbling without subsequent striking against object, initial encounter: Secondary | ICD-10-CM | POA: Diagnosis not present

## 2019-01-26 HISTORY — PX: ANKLE SURGERY: SHX546

## 2019-01-27 DIAGNOSIS — W19XXXA Unspecified fall, initial encounter: Secondary | ICD-10-CM | POA: Diagnosis not present

## 2019-01-27 DIAGNOSIS — S82842A Displaced bimalleolar fracture of left lower leg, initial encounter for closed fracture: Secondary | ICD-10-CM | POA: Diagnosis not present

## 2019-01-27 DIAGNOSIS — K589 Irritable bowel syndrome without diarrhea: Secondary | ICD-10-CM | POA: Diagnosis not present

## 2019-01-27 DIAGNOSIS — S82832D Other fracture of upper and lower end of left fibula, subsequent encounter for closed fracture with routine healing: Secondary | ICD-10-CM | POA: Diagnosis not present

## 2019-01-27 DIAGNOSIS — E785 Hyperlipidemia, unspecified: Secondary | ICD-10-CM | POA: Diagnosis not present

## 2019-01-27 DIAGNOSIS — S82845A Nondisplaced bimalleolar fracture of left lower leg, initial encounter for closed fracture: Secondary | ICD-10-CM | POA: Diagnosis not present

## 2019-01-27 DIAGNOSIS — S8252XD Displaced fracture of medial malleolus of left tibia, subsequent encounter for closed fracture with routine healing: Secondary | ICD-10-CM | POA: Diagnosis not present

## 2019-01-29 DIAGNOSIS — W19XXXA Unspecified fall, initial encounter: Secondary | ICD-10-CM | POA: Diagnosis not present

## 2019-01-29 DIAGNOSIS — K589 Irritable bowel syndrome without diarrhea: Secondary | ICD-10-CM | POA: Diagnosis not present

## 2019-01-29 DIAGNOSIS — S82842A Displaced bimalleolar fracture of left lower leg, initial encounter for closed fracture: Secondary | ICD-10-CM | POA: Diagnosis not present

## 2019-02-03 DIAGNOSIS — S82842D Displaced bimalleolar fracture of left lower leg, subsequent encounter for closed fracture with routine healing: Secondary | ICD-10-CM | POA: Diagnosis not present

## 2019-02-03 DIAGNOSIS — S82842A Displaced bimalleolar fracture of left lower leg, initial encounter for closed fracture: Secondary | ICD-10-CM | POA: Insufficient documentation

## 2019-02-10 ENCOUNTER — Other Ambulatory Visit: Payer: Self-pay | Admitting: Infectious Diseases

## 2019-02-10 DIAGNOSIS — Z21 Asymptomatic human immunodeficiency virus [HIV] infection status: Secondary | ICD-10-CM

## 2019-02-11 DIAGNOSIS — S82842D Displaced bimalleolar fracture of left lower leg, subsequent encounter for closed fracture with routine healing: Secondary | ICD-10-CM | POA: Diagnosis not present

## 2019-02-12 DIAGNOSIS — Z0000 Encounter for general adult medical examination without abnormal findings: Secondary | ICD-10-CM | POA: Diagnosis not present

## 2019-02-12 DIAGNOSIS — Z1389 Encounter for screening for other disorder: Secondary | ICD-10-CM | POA: Diagnosis not present

## 2019-02-12 DIAGNOSIS — M84372A Stress fracture, left ankle, initial encounter for fracture: Secondary | ICD-10-CM | POA: Diagnosis not present

## 2019-02-19 ENCOUNTER — Other Ambulatory Visit: Payer: Self-pay | Admitting: Infectious Diseases

## 2019-02-19 DIAGNOSIS — Z21 Asymptomatic human immunodeficiency virus [HIV] infection status: Secondary | ICD-10-CM

## 2019-03-14 ENCOUNTER — Other Ambulatory Visit: Payer: Self-pay | Admitting: Infectious Diseases

## 2019-03-14 DIAGNOSIS — Z21 Asymptomatic human immunodeficiency virus [HIV] infection status: Secondary | ICD-10-CM

## 2019-04-18 ENCOUNTER — Other Ambulatory Visit: Payer: Self-pay | Admitting: Infectious Diseases

## 2019-04-18 DIAGNOSIS — Z21 Asymptomatic human immunodeficiency virus [HIV] infection status: Secondary | ICD-10-CM

## 2019-05-07 ENCOUNTER — Other Ambulatory Visit: Payer: Self-pay | Admitting: Infectious Diseases

## 2019-05-07 DIAGNOSIS — Z21 Asymptomatic human immunodeficiency virus [HIV] infection status: Secondary | ICD-10-CM

## 2019-05-08 ENCOUNTER — Other Ambulatory Visit: Payer: Self-pay | Admitting: Infectious Diseases

## 2019-05-08 DIAGNOSIS — Z21 Asymptomatic human immunodeficiency virus [HIV] infection status: Secondary | ICD-10-CM

## 2019-05-11 ENCOUNTER — Other Ambulatory Visit: Payer: Self-pay

## 2019-05-11 ENCOUNTER — Other Ambulatory Visit: Payer: Medicare Other

## 2019-05-11 DIAGNOSIS — Z79899 Other long term (current) drug therapy: Secondary | ICD-10-CM | POA: Diagnosis not present

## 2019-05-11 DIAGNOSIS — B20 Human immunodeficiency virus [HIV] disease: Secondary | ICD-10-CM

## 2019-05-12 LAB — T-HELPER CELL (CD4) - (RCID CLINIC ONLY)
CD4 % Helper T Cell: 30 % — ABNORMAL LOW (ref 33–65)
CD4 T Cell Abs: 470 /uL (ref 400–1790)

## 2019-05-13 DIAGNOSIS — K210 Gastro-esophageal reflux disease with esophagitis: Secondary | ICD-10-CM | POA: Diagnosis not present

## 2019-05-13 DIAGNOSIS — K589 Irritable bowel syndrome without diarrhea: Secondary | ICD-10-CM | POA: Diagnosis not present

## 2019-05-13 DIAGNOSIS — G43909 Migraine, unspecified, not intractable, without status migrainosus: Secondary | ICD-10-CM | POA: Diagnosis not present

## 2019-05-16 LAB — LIPID PANEL
Cholesterol: 173 mg/dL (ref <200)
HDL: 52 mg/dL (ref >=50)
LDL Cholesterol (Calc): 91 mg/dL (calc)
Non-HDL Cholesterol (Calc): 121 mg/dL (calc) (ref <130)
Total CHOL/HDL Ratio: 3.3 (calc) (ref <5.0)
Triglycerides: 198 mg/dL — ABNORMAL HIGH (ref <150)

## 2019-05-16 LAB — HIV-1 RNA QUANT-NO REFLEX-BLD
HIV 1 RNA Quant: <20 copies/mL
HIV-1 RNA Quant, Log: <1.3 Log copies/mL

## 2019-05-25 ENCOUNTER — Other Ambulatory Visit: Payer: Self-pay

## 2019-05-25 ENCOUNTER — Ambulatory Visit (INDEPENDENT_AMBULATORY_CARE_PROVIDER_SITE_OTHER): Payer: Medicare Other | Admitting: Infectious Diseases

## 2019-05-25 ENCOUNTER — Encounter: Payer: Self-pay | Admitting: Infectious Diseases

## 2019-05-25 VITALS — BP 118/81 | HR 81 | Temp 99.1°F | Ht 61.0 in | Wt 168.0 lb

## 2019-05-25 DIAGNOSIS — Z8619 Personal history of other infectious and parasitic diseases: Secondary | ICD-10-CM | POA: Diagnosis not present

## 2019-05-25 DIAGNOSIS — Z21 Asymptomatic human immunodeficiency virus [HIV] infection status: Secondary | ICD-10-CM

## 2019-05-25 DIAGNOSIS — E7800 Pure hypercholesterolemia, unspecified: Secondary | ICD-10-CM | POA: Diagnosis not present

## 2019-05-25 DIAGNOSIS — Z23 Encounter for immunization: Secondary | ICD-10-CM

## 2019-05-25 DIAGNOSIS — K7460 Unspecified cirrhosis of liver: Secondary | ICD-10-CM

## 2019-05-25 MED ORDER — BICTEGRAVIR-EMTRICITAB-TENOFOV 50-200-25 MG PO TABS: 1.0000 | ORAL_TABLET | Freq: Every day | ORAL | 6 refills | Status: DC

## 2019-05-25 NOTE — Progress Notes (Signed)
Patient Name: Laura Jones  Date of Birth: 05/24/1971 MRN: [id number]  PCP: Toma Deiters, MD   Patient Active Problem List   Diagnosis Date Noted  . Hepatic cirrhosis (HCC) 05/27/2019  . Vulvar lesion 10/14/2018  . OA (osteoarthritis) of hip 06/18/2018  . Hypercholesteremia 12/08/2017  . Hepatitis C virus infection cured after antiviral drug therapy 07/30/2017  . Acid reflux 07/30/2017  . HIV (human immunodeficiency virus infection) (HCC) 07/29/2017   SUBJECTIVE:  Brief Narrative:   Chenin is a 48 y.o. female with HIV disease, dx "a long time ago." Relocated from [place] in 2018 where she was in care with [facility].  History of Hep C, cured (s/p treatment x 2, HCV Quant < 15, 07/29/2017).   +Cirrhosis per notes from previous liver biopsies  S/P hysterectomy Hep B sAg (-), Hep B sAb (-) following several attempts to vaccinate.  History of OIs: none known.  HIV Risk: heterosexual contact  Previous Regimens:   Genvoya >> suppressed   Biktarvy 09/2018 >> switched d/t DDI with medications  Genotype:   K103N - NNRTI resistance per chart records   Patient Active Problem List   Diagnosis Date Noted  . Hepatic cirrhosis (HCC) 05/27/2019  . Vulvar lesion 10/14/2018  . OA (osteoarthritis) of hip 06/18/2018  . Hypercholesteremia 12/08/2017  . Hepatitis C virus infection cured after antiviral drug therapy 07/30/2017  . Acid reflux 07/30/2017  . HIV (human immunodeficiency virus infection) (HCC) 07/29/2017    Chief Complaint  Patient presents with  . Follow-up     HPI/ROS:  She is doing well on her Biktarvy and reports no trouble with side effects or concerns with accessing her medications. She reports excellent adherence and continues to take this every day without any lapses.  Interim health history noted for fall and fracture of the left ankle requiring internal fixation with hardware. She states that she recovered nicely from this and has had  no trouble post op. She does not that she has some "swaying" sometimes with her walking and notices she wavers to the right side at times. She denies any associated dizziness or weakness.   Denies any sexual activity since last office visit.   Review of Systems  Constitutional: Negative for chills, fever, malaise/fatigue and weight loss.  HENT: Negative for sore throat.        No dental problems  Respiratory: Negative for cough and sputum production.   Cardiovascular: Negative for chest pain and leg swelling.  Gastrointestinal: Negative for abdominal pain, diarrhea and vomiting.  Genitourinary: Negative for dysuria and flank pain.  Musculoskeletal: Positive for falls. Negative for joint pain, myalgias and neck pain.  Skin: Negative for rash.  Neurological: Negative for dizziness, tingling and headaches.       Off balance  Psychiatric/Behavioral: Negative for depression and substance abuse. The patient is not nervous/anxious and does not have insomnia.     Past Medical History:  Diagnosis Date  . Anemia    hx of   . Anxiety   . Arthritis    left knee  . Avascular necrosis of bone of hip, left (HCC)   . Bulging lumbar disc   . Depression   . Dyspnea    with excertion  . GERD (gastroesophageal reflux disease)   . Hepatitis    C treated 2 years ago went undetected  . History of kidney stones   . HIV (human immunodeficiency virus infection) (HCC) 07/29/2017  . HIV infection (HCC)   .  Insomnia   . Migraines   . Nerve pain   . Perimenopausal   . Porphyria (HCC)   . PTSD (post-traumatic stress disorder)   . Spinal stenosis of lumbosacral region   . Tendonitis    right arm     Social History   Tobacco Use  . Smoking status: Never Smoker  . Smokeless tobacco: Never Used  Substance Use Topics  . Alcohol use: No  . Drug use: No    Family History  Problem Relation Age of Onset  . COPD Mother   . Osteoporosis Mother   . Hypertension Father   . Healthy Sister   . Heart  disease Maternal Grandmother   . Heart disease Maternal Grandfather     Allergies  Allergen Reactions  . Levaquin [Levofloxacin] Rash    Difficulty breathing, mouth swelling/rash  . Fish Oil Nausea And Vomiting  . Compazine [Prochlorperazine Edisylate]     Muscle twitching  . Droperidol     uncontrolled muscle movements   . Imitrex [Sumatriptan]     Difficulty breathing, chest pain   . Reglan [Metoclopramide]     Uncontrolled twitching  . Sulfa Antibiotics Hives    Hives   . Thorazine [Chlorpromazine]     Uncontrolled muscle twitching    Objective:  Vitals:   05/25/19 1431  BP: 118/81  Pulse: 81  Temp: 99.1 F (37.3 C)  TempSrc: Oral  SpO2: 97%  Weight: 168 lb (76.2 kg)  Height: 5\' 1"  (1.549 m)   Body mass index is 31.74 kg/m.  Physical Exam Constitutional:      Appearance: She is well-developed.     Comments: Seated comfortably in chair.   HENT:     Mouth/Throat:     Mouth: No oral lesions.     Dentition: Normal dentition. No dental abscesses.     Pharynx: No oropharyngeal exudate.  Cardiovascular:     Rate and Rhythm: Normal rate and regular rhythm.     Heart sounds: Normal heart sounds.  Pulmonary:     Effort: Pulmonary effort is normal.     Breath sounds: Normal breath sounds.  Abdominal:     General: There is no distension.     Palpations: Abdomen is soft.     Tenderness: There is no abdominal tenderness.  Musculoskeletal:     Comments: Normal gait observed with walking down hall.  Well healed surgical incision to lateral/medial malleolus of the left ankle.   Lymphadenopathy:     Cervical: No cervical adenopathy.  Skin:    General: Skin is warm and dry.     Findings: No rash.     Comments: Multiple white macules over forearms b/l  Neurological:     Mental Status: She is alert and oriented to person, place, and time.  Psychiatric:        Judgment: Judgment normal.     Comments: In good spirits today and engaged in care discussion     Lab  Results Lab Results  Component Value Date   WBC 8.1 06/20/2018   HGB 9.5 (L) 06/20/2018   HCT 28.8 (L) 06/20/2018   MCV 102.1 (H) 06/20/2018   PLT 148 (L) 06/20/2018    Lab Results  Component Value Date   CREATININE 0.82 06/20/2018   BUN 13 06/20/2018   NA 142 06/20/2018   K 4.2 06/20/2018   CL 107 06/20/2018   CO2 26 06/20/2018    Lab Results  Component Value Date   ALT 19 06/16/2018  AST 21 06/16/2018   ALKPHOS 81 06/16/2018   BILITOT 0.2 (L) 06/16/2018    Lab Results  Component Value Date   CHOL 173 05/11/2019   HDL 52 05/11/2019   LDLCALC 91 05/11/2019   TRIG 198 (H) 05/11/2019   CHOLHDL 3.3 05/11/2019   HIV 1 RNA Quant (copies/mL)  Date Value  05/11/2019 <20 NOT DETECTED  10/13/2018 55 (H)  12/05/2017 <20 DETECTED (A)   CD4 T Cell Abs (/uL)  Date Value  05/11/2019 470  10/13/2018 350 (L)  12/05/2017 570   Lab Results  Component Value Date   HAV REACTIVE (A) 07/29/2017   Lab Results  Component Value Date   HEPBSAG NON-REACTIVE 07/29/2017   HEPBSAB NON-REACTIVE 07/29/2017   Lab Results  Component Value Date   QUANTGOLD NEGATIVE 07/29/2017    ASSESSMENT & PLAN:   Problem List Items Addressed This Visit      Unprioritized   Hepatic cirrhosis (HCC)    Diagnosed on previous liver biopsies per records from [place]. Will arrange for elastography as above.       Relevant Orders   US ABDOMEN COMPLETE W/ELASTOGRAPHY   Hepatitis C virus infection cured after antiviral drug therapy    With findings of cirrhosis reported on previous liver biopsies will arrange a elastography prior to next office visit.       HIV (human immunodeficiency virus infection) (HCC) - Primary (Chronic)    She is doing well on her Biktarvy. No drug interactions identified with medicine review. She is taking this correctly. I reviewed all current lab work with her and answered her questions. Continue Biktarvy and Q8542m visits.  No STI screening warranted.  Declined condoms.   PCV13 vaccine today.       Relevant Medications   fluconazole (DIFLUCAN) 150 MG tablet   bictegravir-emtricitabine-tenofovir AF (BIKTARVY) 50-200-25 MG TABS tablet   Other Relevant Orders   T-helper cell (CD4)- ([facility] clinic only)   HIV-1 RNA quant-no reflex-bld   Pneumococcal conjugate vaccine 13-valent IM (Completed)   Hypercholesteremia    Improved on lipitor. No changes needed with LDL in target range.   Lab Results  Component Value Date   CHOL 173 05/11/2019   HDL 52 05/11/2019   LDLCALC 91 05/11/2019   TRIG 198 (H) 05/11/2019   CHOLHDL 3.3 05/11/2019          Other Visit Diagnoses    Need for vaccination with 13-polyvalent pneumococcal conjugate vaccine       Relevant Orders   Pneumococcal conjugate vaccine 13-valent IM (Completed)      Rexene AlbertsStephanie Seletha Zimmermann, MSN, NP-C [facility]  [email or web address] Pager: [phone] Office: [phone] [facility] Main Line: [phone]

## 2019-05-25 NOTE — Patient Instructions (Addendum)
It is nice to see that you are doing well.  I am glad you healed well following your fall and your surgery.  We will work on getting you a updated liver ultrasound done before our next appointment in 6 months.  Please continue taking your Biktarvy once a day.  We gave you your Prevnar vaccine today.  Please schedule nurse visit in 2 months to get your second Pneumovax booster.  Please call [facility] @ [phone] to schedule a dental appointment    Return to the clinic in 6 months for follow-up with labs prior to your visit.

## 2019-05-27 DIAGNOSIS — K7400 Hepatic fibrosis, unspecified: Secondary | ICD-10-CM | POA: Insufficient documentation

## 2019-05-27 DIAGNOSIS — K7460 Unspecified cirrhosis of liver: Secondary | ICD-10-CM | POA: Insufficient documentation

## 2019-05-27 NOTE — Assessment & Plan Note (Signed)
Diagnosed on previous liver biopsies per records from [place]. Will arrange for elastography as above.

## 2019-05-27 NOTE — Assessment & Plan Note (Signed)
She is doing well on her Biktarvy. No drug interactions identified with medicine review. She is taking this correctly. I reviewed all current lab work with her and answered her questions. Continue Biktarvy and Q46m visits.  No STI screening warranted.  Declined condoms.  PCV13 vaccine today.

## 2019-05-27 NOTE — Assessment & Plan Note (Signed)
Improved on lipitor. No changes needed with LDL in target range.   Lab Results  Component Value Date   CHOL 173 05/11/2019   HDL 52 05/11/2019   LDLCALC 91 05/11/2019   TRIG 198 (H) 05/11/2019   CHOLHDL 3.3 05/11/2019

## 2019-05-27 NOTE — Assessment & Plan Note (Signed)
With findings of cirrhosis reported on previous liver biopsies will arrange a elastography prior to next office visit.

## 2019-06-08 ENCOUNTER — Other Ambulatory Visit: Payer: Self-pay

## 2019-06-08 ENCOUNTER — Ambulatory Visit (HOSPITAL_COMMUNITY)
Admission: RE | Admit: 2019-06-08 | Discharge: 2019-06-08 | Disposition: A | Discharge status: Home / Self Care | Payer: Medicare Other | Source: Ambulatory Visit | Attending: Infectious Diseases | Admitting: Infectious Diseases

## 2019-06-08 DIAGNOSIS — K7460 Unspecified cirrhosis of liver: Secondary | ICD-10-CM | POA: Insufficient documentation

## 2019-06-08 NOTE — Progress Notes (Signed)
Liver elastography without any findings concerning for hepatocellular carcinoma.  She still has a moderate to severe risk for fibrosis based on her scan with F2 and some F3.  Diffusely coarsened liver parenchyma and irregularities of the surface. We will continue with every 6 months ultrasounds for [facility] screening.

## 2019-06-30 ENCOUNTER — Encounter: Payer: Self-pay | Admitting: Obstetrics & Gynecology

## 2019-06-30 ENCOUNTER — Ambulatory Visit (INDEPENDENT_AMBULATORY_CARE_PROVIDER_SITE_OTHER): Payer: Medicare Other | Admitting: Obstetrics & Gynecology

## 2019-06-30 ENCOUNTER — Other Ambulatory Visit: Payer: Self-pay

## 2019-06-30 VITALS — BP 115/73 | HR 81 | Ht 61.0 in | Wt 174.5 lb

## 2019-06-30 DIAGNOSIS — N9089 Other specified noninflammatory disorders of vulva and perineum: Secondary | ICD-10-CM | POA: Diagnosis not present

## 2019-06-30 NOTE — Progress Notes (Signed)
Preoperative History and Physical  Laura Jones is a 48 y.o. G2P1011 with Patient's last menstrual period was 12/25/2003. admitted for a vulvar biopsy with laser partial vulvectomy of perineal warty dysplastic lesion.    PMH:    Past Medical History:  Diagnosis Date  . Anemia    hx of   . Anxiety   . Arthritis    left knee  . Avascular necrosis of bone of hip, left (HCC)   . Bulging lumbar disc   . Depression   . Dyspnea    with excertion  . GERD (gastroesophageal reflux disease)   . Hepatitis    C treated 2 years ago went undetected  . History of kidney stones   . HIV (human immunodeficiency virus infection) (HCC) 07/29/2017  . HIV infection (HCC)   . Insomnia   . Migraines   . Nerve pain   . Perimenopausal   . Porphyria (HCC)   . PTSD (post-traumatic stress disorder)   . Spinal stenosis of lumbosacral region   . Tendonitis    right arm   . Trauma     PSH:     Past Surgical History:  Procedure Laterality Date  . ABDOMINAL HYSTERECTOMY    . ANKLE SURGERY Left 01/26/2019  . CESAREAN SECTION    . ESOPHAGOGASTRODUODENOSCOPY     x2  . INCONTINENCE SURGERY    . JOINT REPLACEMENT     left hip replacement Dr. Lequita HaltAluisio 06-18-18  . TOTAL HIP ARTHROPLASTY Right 02/2013  . TOTAL HIP ARTHROPLASTY Left 06/18/2018   Procedure: LEFT TOTAL HIP ARTHROPLASTY ANTERIOR APPROACH;  Surgeon: Ollen GrossAluisio, Frank, MD;  Location: [facility];  Service: Orthopedics;  Laterality: Left;  . TUBAL LIGATION    . URETHRAL SLING      POb/GynH:      OB History    Gravida  2   Para  1   Term  1   Preterm      AB  1   Living  1     SAB  1   TAB      Ectopic      Multiple      Live Births  1           SH:   Social History   Tobacco Use  . Smoking status: Never Smoker  . Smokeless tobacco: Never Used  Substance Use Topics  . Alcohol use: No  . Drug use: No    FH:    Family History  Problem Relation Age of Onset  . COPD Mother   . Osteoporosis Mother   .  Hypertension Father   . Healthy Sister   . Heart disease Maternal Grandmother   . Heart disease Maternal Grandfather      Allergies:  Allergies  Allergen Reactions  . Levaquin [Levofloxacin] Rash    Difficulty breathing, mouth swelling/rash  . Fish Oil Nausea And Vomiting  . Compazine [Prochlorperazine Edisylate]     Muscle twitching  . Droperidol     uncontrolled muscle movements   . Imitrex [Sumatriptan]     Difficulty breathing, chest pain; All "triptans"  . Reglan [Metoclopramide]     Uncontrolled twitching  . Sulfa Antibiotics Hives    Hives   . Thorazine [Chlorpromazine]     Uncontrolled muscle twitching    Medications:       Current Outpatient Medications:  .  atorvastatin (LIPITOR) 10 MG tablet, Take 10 mg by mouth at bedtime., Disp: , Rfl: 0 .  bictegravir-emtricitabine-tenofovir  AF (BIKTARVY) 50-200-25 MG TABS tablet, Take 1 tablet by mouth daily., Disp: 30 tablet, Rfl: 6 .  buPROPion (ZYBAN) 150 MG 12 hr tablet, Take 150 mg by mouth 2 (two) times daily., Disp: , Rfl:  .  busPIRone (BUSPAR) 15 MG tablet, Take 15 mg by mouth 2 (two) times daily., Disp: , Rfl: 0 .  cholecalciferol (VITAMIN D) 1000 units tablet, Take 4,000 Units by mouth daily., Disp: , Rfl:  .  citalopram (CELEXA) 40 MG tablet, Take 40 mg by mouth daily., Disp: , Rfl:  .  clobetasol ointment (TEMOVATE) 0.05 %, Apply 1 application topically 2 (two) times daily., Disp: 30 g, Rfl: 2 .  dexlansoprazole (DEXILANT) 60 MG capsule, Take 60 mg by mouth daily., Disp: , Rfl:  .  dicyclomine (BENTYL) 10 MG capsule, , Disp: , Rfl:  .  docusate sodium (STOOL SOFTENER) 100 MG capsule, , Disp: , Rfl:  .  fluconazole (DIFLUCAN) 150 MG tablet, as needed. , Disp: , Rfl:  .  loperamide (IMODIUM A-D) 2 MG tablet, Take 2 mg by mouth as needed for diarrhea or loose stools., Disp: , Rfl:  .  ondansetron (ZOFRAN) 8 MG tablet, Take by mouth every 8 (eight) hours as needed for nausea or vomiting., Disp: , Rfl:  .  senna  (SENOKOT) 8.6 MG tablet, , Disp: , Rfl:  .  tiZANidine (ZANAFLEX) 4 MG tablet, Take 4 mg by mouth at bedtime., Disp: , Rfl:  .  traZODone (DESYREL) 100 MG tablet, Take 100-200 mg by mouth at bedtime., Disp: , Rfl:  .  vitamin B-12 (CYANOCOBALAMIN) 500 MCG tablet, Take 500 mcg by mouth daily. Two tablets at night, Disp: , Rfl:   Review of Systems:   Review of Systems  Constitutional: Negative for fever, chills, weight loss, malaise/fatigue and diaphoresis.  HENT: Negative for hearing loss, ear pain, nosebleeds, congestion, sore throat, neck pain, tinnitus and ear discharge.   Eyes: Negative for blurred vision, double vision, photophobia, pain, discharge and redness.  Respiratory: Negative for cough, hemoptysis, sputum production, shortness of breath, wheezing and stridor.   Cardiovascular: Negative for chest pain, palpitations, orthopnea, claudication, leg swelling and PND.  Gastrointestinal: Positive for abdominal pain. Negative for heartburn, nausea, vomiting, diarrhea, constipation, blood in stool and melena.  Genitourinary: Negative for dysuria, urgency, frequency, hematuria and flank pain.  Musculoskeletal: Negative for myalgias, back pain, joint pain and falls.  Skin: Negative for itching and rash.  Neurological: Negative for dizziness, tingling, tremors, sensory change, speech change, focal weakness, seizures, loss of consciousness, weakness and headaches.  Endo/Heme/Allergies: Negative for environmental allergies and polydipsia. Does not bruise/bleed easily.  Psychiatric/Behavioral: Negative for depression, suicidal ideas, hallucinations, memory loss and substance abuse. The patient is not nervous/anxious and does not have insomnia.      PHYSICAL EXAM:  Blood pressure 115/73, pulse 81, height 5\' 1"  (1.549 m), weight 174 lb 8 oz (79.2 kg), last menstrual period 12/25/2003.    Vitals reviewed. Constitutional: She is oriented to person, place, and time. She appears well-developed and  well-nourished.  HENT:  Head: Normocephalic and atraumatic.  Right Ear: External ear normal.  Left Ear: External ear normal.  Nose: Nose normal.  Mouth/Throat: Oropharynx is clear and moist.  Eyes: Conjunctivae and EOM are normal. Pupils are equal, round, and reactive to light. Right eye exhibits no discharge. Left eye exhibits no discharge. No scleral icterus.  Neck: Normal range of motion. Neck supple. No tracheal deviation present. No thyromegaly present.  Cardiovascular: Normal rate, regular rhythm,  normal heart sounds and intact distal pulses.  Exam reveals no gallop and no friction rub.   No murmur heard. Respiratory: Effort normal and breath sounds normal. No respiratory distress. She has no wheezes. She has no rales. She exhibits no tenderness.  GI: Soft. Bowel sounds are normal. She exhibits no distension and no mass. There is tenderness. There is no rebound and no guarding.  Genitourinary:       Vulva is normal without lesions Vagina is pink moist without discharge Cervix normal in appearance and pap is normal Uterus is uterus absent Adnexa is negative with normal sized ovaries by sonogram  Musculoskeletal: Normal range of motion. She exhibits no edema and no tenderness.  Neurological: She is alert and oriented to person, place, and time. She has normal reflexes. She displays normal reflexes. No cranial nerve deficit. She exhibits normal muscle tone. Coordination normal.  Skin: Skin is warm and dry. No rash noted. No erythema. No pallor.  Psychiatric: She has a normal mood and affect. Her behavior is normal. Judgment and thought content normal.    Labs: No results found for this or any previous visit (from the past 336 hour(s)).  EKG: No orders found for this or any previous visit.  Imaging Studies: Laura Jones  Result Date: 06/08/2019 CLINICAL DATA:  Hepatic cirrhosis.  Chronic hepatitis-C. EXAM: ULTRASOUND ABDOMEN ULTRASOUND HEPATIC ELASTOGRAPHY  TECHNIQUE: Sonography of the upper abdomen was performed. In addition, ultrasound elastography evaluation of the liver was performed. A region of interest was placed within the right lobe of the liver. Following application of a compressive sonographic pulse, shear waves were detected in the adjacent hepatic tissue and the shear wave velocity was calculated. Multiple assessments were performed at the selected site. Median shear wave velocity is correlated to a Metavir fibrosis score. COMPARISON:  None. FINDINGS: ULTRASOUND ABDOMEN Gallbladder: No gallstones or wall thickening visualized. No sonographic Murphy sign noted by sonographer. Common bile duct: Diameter: 4 mm Liver: Liver parenchyma is diffusely coarsened in echotexture. Liver surface irregularity. No definite no liver masses. Portal vein is patent on color Doppler imaging with normal direction of blood flow towards the liver. IVC: No abnormality visualized. Pancreas: Visualized portion unremarkable. Spleen: Size and appearance within normal limits. Right Kidney: Length: 9.9 cm. Echogenicity within normal limits. No mass or hydronephrosis visualized. Left Kidney: Length: 9.6 cm. Echogenicity within normal limits. No mass or hydronephrosis visualized. Abdominal aorta: No aneurysm visualized. Other findings: None. ULTRASOUND HEPATIC ELASTOGRAPHY Device: Siemens Helix VTQ Patient position: Supine Transducer 5C1 Number of measurements: 10 Hepatic segment:  8 Median velocity:   1.25 m/sec IQR: 0.14 IQR/Median velocity ratio: 0.11 Corresponding Metavir fibrosis score:  F2 + some F3 Risk of fibrosis: Moderate Limitations of exam: None Please note that abnormal shear wave velocities may also be identified in clinical settings other than with hepatic fibrosis, such as: acute hepatitis, elevated right heart and central venous pressures including use of beta blockers, veno-occlusive disease (Budd-Chiari), infiltrative processes such as  mastocytosis/amyloidosis/infiltrative tumor, extrahepatic cholestasis, in the post-prandial state, and liver transplantation. Correlation with patient history, laboratory data, and clinical condition recommended. IMPRESSION: ULTRASOUND ABDOMEN: 1. Diffusely coarsened liver parenchymal echotexture, nonspecific, suggesting hepatic fibrosis. No overt morphologic changes of hepatic cirrhosis. No liver masses. 2. Otherwise normal abdominal sonogram. ULTRASOUND HEPATIC ELASTOGRAPHY: Median hepatic shear wave velocity is calculated at 1.25 m/sec. Corresponding Metavir fibrosis score is F2 + some F3. Risk of fibrosis is Moderate. Follow-up: Additional testing appropriate. Electronically Signed   By: Tomasa HoseJason A  Poff M.D.   On: 06/08/2019 14:09      Assessment:   ICD-10-CM   1. Vulvar lesion  N90.89    warty dysplasitc lesion     Patient Active Problem List   Diagnosis Date Noted  . Hepatic cirrhosis ([place]) 05/27/2019  . Vulvar lesion 10/14/2018  . OA (osteoarthritis) of hip 06/18/2018  . Hypercholesteremia 12/08/2017  . Hepatitis C virus infection cured after antiviral drug therapy 07/30/2017  . Acid reflux 07/30/2017  . HIV (human immunodeficiency virus infection) ([place]) 07/29/2017    Plan: >Vulvar biopsy with partial lser vulvectomy 07/22/2019  Mertie Clause Rubi Tooley 06/30/2019 10:29 AM

## 2019-07-14 ENCOUNTER — Ambulatory Visit: Payer: Medicare Other | Admitting: Neurology

## 2019-07-14 DIAGNOSIS — K219 Gastro-esophageal reflux disease without esophagitis: Secondary | ICD-10-CM | POA: Diagnosis not present

## 2019-07-14 DIAGNOSIS — Z79899 Other long term (current) drug therapy: Secondary | ICD-10-CM | POA: Diagnosis not present

## 2019-07-14 DIAGNOSIS — K029 Dental caries, unspecified: Secondary | ICD-10-CM | POA: Diagnosis not present

## 2019-07-20 ENCOUNTER — Other Ambulatory Visit (HOSPITAL_COMMUNITY): Payer: Medicare Other

## 2019-07-22 ENCOUNTER — Encounter: Payer: Self-pay | Admitting: Infectious Diseases

## 2019-07-31 ENCOUNTER — Encounter: Payer: Medicare Other | Admitting: Obstetrics & Gynecology

## 2019-08-11 ENCOUNTER — Ambulatory Visit: Payer: Medicare Other | Admitting: Neurology

## 2019-08-12 NOTE — Patient Instructions (Signed)
Laura Jones  08/12/2019     @PREFPERIOPPHARMACY @   Your procedure is scheduled on  08/19/2019.  [facility] at  [phone]  A.M.  Call this number if you have problems the morning of surgery:  [phone]   Remember:  Do not eat or drink after midnight.                      Take these medicines the morning of surgery with A SIP OF WATER  Bictegravir, buspar, citalopram, dexilant, zofran)if needed), zanaflex(if needed).    Do not wear jewelry, make-up or nail polish.  Do not wear lotions, powders, or perfumes. Please wear deodorant and brush your teeth.  Do not shave 48 hours prior to surgery.  Men may shave face and neck.  Do not bring valuables to the hospital.  [facility] belongings or valuables.  Contacts, dentures or bridgework may not be worn into surgery.  Leave your suitcase in the car.  After surgery it may be brought to your room.  For patients admitted to the hospital, discharge time will be determined by your treatment team.  Patients discharged the day of surgery will not be allowed to drive home.   Name and phone number of your driver:   family Special instructions:  None Please read over the following fact sheets that you were given. Anesthesia Post-op Instructions and Care and Recovery After Surgery       Vulvectomy, Care After This sheet gives you information about how to care for yourself after your procedure. Your health care provider may also give you more specific instructions. If you have problems or questions, contact your health care provider. What can I expect after the procedure? After the procedure, it is common to have:  Vaginal pain.  Vaginal numbness.  Vaginal swelling.  Bloody vaginal discharge. Follow these instructions at home: Activity   Rest as told by your health care provider.  Do not lift, push, or pull more than 5 lb (2.3 kg), or the limit that you are told, until your  health care provider says that it is safe.  Avoid activities that take a lot of effort for as long as told by your health care provider. This includes any exercise.  Raise (elevate) your legs while sitting or lying down.  Avoid standing or sitting in one place for long periods of time.  Do not cross your legs, especially when sitting, until your health care provider approves.  Return to your normal activities as told by your health care provider. Ask your health care provider what activities are safe for you. Bathing   Do not take baths, swim, or use a hot tub until your health care provider approves. Ask your health care provider if you can take showers. You may only be allowed to take sponge baths.  After passing urine or a bowel movement, wipe yourself from front to back and clean your vaginal area using a spray bottle.  If told by your health care provider, take a sitz bath to help with discomfort. This is a warm water bath you take while sitting down. ? Do this 3-4 times a day, or as often as told by your health care provider. ? The water should only come up to your hips and cover your buttocks. ? You may pat the area dry with a soft, clean towel. ? If needed, you may  then gently dry the area with a hair dryer on a cool setting for 5-10 minutes. An enclosed box fan may also be used to gently dry the area. Incision care   Follow instructions from your health care provider about how to take care of your incision areas. Make sure you: ? Wash your hands with soap and water before and after you change your bandages (dressing). If soap and water are not available, use hand sanitizer. ? Change your dressing as told by your health care provider. ? Leave stitches (sutures), skin glue, adhesive strips, or surgical clips in place. These skin closures may need to stay in place for 2 weeks or longer. If adhesive strip edges start to loosen and curl up, you may trim the loose edges. Do not remove  adhesive strips completely unless your health care provider tells you to do that.  Check your incision areas every day for signs of infection. It may be helpful to use a handheld mirror to do this. Check for: ? Redness, swelling, or pain that has gotten worse. ? More fluid or blood. ? Warmth. ? Pus or a bad smell.  If you were sent home with a drain, take care of it as told by your health care provider. Lifestyle  Do not douche or use tampons until your health care provider approves.  Do not have sex until your health care provider approves. Tell your health care provider if you have pain or numbness when you return to sexual activity.  Wear cotton underwear and comfortable, loose-fitting clothing. Medicines  Take over-the-counter and prescription medicines only as told by your health care provider.  Ask your health care provider if the medicine prescribed to you: ? Requires you to avoid driving or using heavy machinery. ? Can cause constipation. You may need to take these actions to prevent or treat constipation:  Take over-the-counter or prescription medicines.  Eat foods that are high in fiber, such as beans, whole grains, and fresh fruits and vegetables.  Limit foods that are high in fat and processed sugars, such as fried or sweet foods. General instructions  Do not drive until your health care provider says that it is safe.  Drink enough fluid to keep your urine pale yellow.  Wear compression stockings as told by your health care provider. These stockings help to prevent blood clots and reduce swelling in your legs.  Keep all follow-up visits as told by your health care provider. This is important. Contact a health care provider if:  You have any of these problems in the incision area: ? More redness, swelling, or pain around the incision. ? More fluid or blood coming from the incision. ? Pus or a bad smell coming from the incision. ? The incision feels warm to the  touch. ? The incision breaks open.  You have a fever.  You have painful or bloody urination.  You feel nauseous or you vomit.  You have diarrhea.  You develop constipation.  You develop a rash.  You feel dizzy or light-headed.  You have pain that does not get better with medicine. Get help right away if you:  Faint.  Have leg or chest pain.  Have abdominal pain.  Have shortness of breath. Summary  After the procedure, it is common to have some pain, numbness, or swelling in the vaginal area. It is also common to have some bloody vaginal discharge.  Do not lift, push, or pull more than 5 lb (2.3 kg), or the  limit that you are told, or engage in activities that take a lot of effort until your health care provider approves.  Follow instructions from your health care provider about how to take care of your incision.  Keep all follow-up visits as told by your health care provider. This is important. This information is not intended to replace advice given to you by your health care provider. Make sure you discuss any questions you have with your health care provider. Document Released: 07/24/2004 Document Revised: 01/06/2019 Document Reviewed: 01/07/2019 Elsevier Patient Education  2020 [employer]  General Anesthesia, Adult, Care After This sheet gives you information about how to care for yourself after your procedure. Your health care provider may also give you more specific instructions. If you have problems or questions, contact your health care provider. What can I expect after the procedure? After the procedure, the following side effects are common:  Pain or discomfort at the IV site.  Nausea.  Vomiting.  Sore throat.  Trouble concentrating.  Feeling cold or chills.  Weak or tired.  Sleepiness and fatigue.  Soreness and body aches. These side effects can affect parts of the body that were not involved in surgery. Follow these instructions at home:   For at least 24 hours after the procedure:  Have a responsible adult stay with you. It is important to have someone help care for you until you are awake and alert.  Rest as needed.  Do not: ? Participate in activities in which you could fall or become injured. ? Drive. ? Use heavy machinery. ? Drink alcohol. ? Take sleeping pills or medicines that cause drowsiness. ? Make important decisions or sign legal documents. ? Take care of children on your own. Eating and drinking  Follow any instructions from your health care provider about eating or drinking restrictions.  When you feel hungry, start by eating small amounts of foods that are soft and easy to digest (bland), such as toast. Gradually return to your regular diet.  Drink enough fluid to keep your urine pale yellow.  If you vomit, rehydrate by drinking water, juice, or clear broth. General instructions  If you have sleep apnea, surgery and certain medicines can increase your risk for breathing problems. Follow instructions from your health care provider about wearing your sleep device: ? Anytime you are sleeping, including during daytime naps. ? While taking prescription pain medicines, sleeping medicines, or medicines that make you drowsy.  Return to your normal activities as told by your health care provider. Ask your health care provider what activities are safe for you.  Take over-the-counter and prescription medicines only as told by your health care provider.  If you smoke, do not smoke without supervision.  Keep all follow-up visits as told by your health care provider. This is important. Contact a health care provider if:  You have nausea or vomiting that does not get better with medicine.  You cannot eat or drink without vomiting.  You have pain that does not get better with medicine.  You are unable to pass urine.  You develop a skin rash.  You have a fever.  You have redness around your IV site that  gets worse. Get help right away if:  You have difficulty breathing.  You have chest pain.  You have blood in your urine or stool, or you vomit blood. Summary  After the procedure, it is common to have a sore throat or nausea. It is also common to feel tired.  Have a responsible adult stay with you for the first 24 hours after general anesthesia. It is important to have someone help care for you until you are awake and alert.  When you feel hungry, start by eating small amounts of foods that are soft and easy to digest (bland), such as toast. Gradually return to your regular diet.  Drink enough fluid to keep your urine pale yellow.  Return to your normal activities as told by your health care provider. Ask your health care provider what activities are safe for you. This information is not intended to replace advice given to you by your health care provider. Make sure you discuss any questions you have with your health care provider. Document Released: 03/18/2001 Document Revised: 12/13/2017 Document Reviewed: 07/26/2017 Elsevier Patient Education  2020 [employer]. How to Use Chlorhexidine for Bathing Chlorhexidine gluconate (CHG) is a germ-killing (antiseptic) solution that is used to clean the skin. It can get rid of the bacteria that normally live on the skin and can keep them away for about 24 hours. To clean your skin with CHG, you may be given:  A CHG solution to use in the shower or as part of a sponge bath.  A prepackaged cloth that contains CHG. Cleaning your skin with CHG may help lower the risk for infection:  While you are staying in the intensive care unit of the hospital.  If you have a vascular access, such as a central line, to provide short-term or long-term access to your veins.  If you have a catheter to drain urine from your bladder.  If you are on a ventilator. A ventilator is a machine that helps you breathe by moving air in and out of your lungs.  After  surgery. What are the risks? Risks of using CHG include:  A skin reaction.  Hearing loss, if CHG gets in your ears.  Eye injury, if CHG gets in your eyes and is not rinsed out.  The CHG product catching fire. Make sure that you avoid smoking and flames after applying CHG to your skin. Do not use CHG:  If you have a chlorhexidine allergy or have previously reacted to chlorhexidine.  On babies younger than 25 months of age. How to use CHG solution  Use CHG only as told by your health care provider, and follow the instructions on the label.  Use the full amount of CHG as directed. Usually, this is one bottle. During a shower Follow these steps when using CHG solution during a shower (unless your health care provider gives you different instructions): 1. Start the shower. 2. Use your normal soap and shampoo to wash your face and hair. 3. Turn off the shower or move out of the shower stream. 4. Pour the CHG onto a clean washcloth. Do not use any type of brush or rough-edged sponge. 5. Starting at your neck, lather your body down to your toes. Make sure you follow these instructions: ? If you will be having surgery, pay special attention to the part of your body where you will be having surgery. Scrub this area for at least 1 minute. ? Do not use CHG on your head or face. If the solution gets into your ears or eyes, rinse them well with water. ? Avoid your genital area. ? Avoid any areas of skin that have broken skin, cuts, or scrapes. ? Scrub your back and under your arms. Make sure to wash skin folds. 6. Let the lather sit on  your skin for 1-2 minutes or as long as told by your health care provider. 7. Thoroughly rinse your entire body in the shower. Make sure that all body creases and crevices are rinsed well. 8. Dry off with a clean towel. Do not put any substances on your body afterward-such as powder, lotion, or perfume-unless you are told to do so by your health care provider.  Only use lotions that are recommended by the manufacturer. 9. Put on clean clothes or pajamas. 10. If it is the night before your surgery, sleep in clean sheets.  During a sponge bath Follow these steps when using CHG solution during a sponge bath (unless your health care provider gives you different instructions): 1. Use your normal soap and shampoo to wash your face and hair. 2. Pour the CHG onto a clean washcloth. 3. Starting at your neck, lather your body down to your toes. Make sure you follow these instructions: ? If you will be having surgery, pay special attention to the part of your body where you will be having surgery. Scrub this area for at least 1 minute. ? Do not use CHG on your head or face. If the solution gets into your ears or eyes, rinse them well with water. ? Avoid your genital area. ? Avoid any areas of skin that have broken skin, cuts, or scrapes. ? Scrub your back and under your arms. Make sure to wash skin folds. 4. Let the lather sit on your skin for 1-2 minutes or as long as told by your health care provider. 5. Using a different clean, wet washcloth, thoroughly rinse your entire body. Make sure that all body creases and crevices are rinsed well. 6. Dry off with a clean towel. Do not put any substances on your body afterward-such as powder, lotion, or perfume-unless you are told to do so by your health care provider. Only use lotions that are recommended by the manufacturer. 7. Put on clean clothes or pajamas. 8. If it is the night before your surgery, sleep in clean sheets. How to use CHG prepackaged cloths  Only use CHG cloths as told by your health care provider, and follow the instructions on the label.  Use the CHG cloth on clean, dry skin.  Do not use the CHG cloth on your head or face unless your health care provider tells you to.  When washing with the CHG cloth: ? Avoid your genital area. ? Avoid any areas of skin that have broken skin, cuts, or  scrapes. Before surgery Follow these steps when using a CHG cloth to clean before surgery (unless your health care provider gives you different instructions): 1. Using the CHG cloth, vigorously scrub the part of your body where you will be having surgery. Scrub using a back-and-forth motion for 3 minutes. The area on your body should be completely wet with CHG when you are done scrubbing. 2. Do not rinse. Discard the cloth and let the area air-dry. Do not put any substances on the area afterward, such as powder, lotion, or perfume. 3. Put on clean clothes or pajamas. 4. If it is the night before your surgery, sleep in clean sheets.  For general bathing Follow these steps when using CHG cloths for general bathing (unless your health care provider gives you different instructions). 1. Use a separate CHG cloth for each area of your body. Make sure you wash between any folds of skin and between your fingers and toes. Wash your body in the following  order, switching to a new cloth after each step: ? The front of your neck, shoulders, and chest. ? Both of your arms, under your arms, and your hands. ? Your stomach and groin area, avoiding the genitals. ? Your right leg and foot. ? Your left leg and foot. ? The back of your neck, your back, and your buttocks. 2. Do not rinse. Discard the cloth and let the area air-dry. Do not put any substances on your body afterward-such as powder, lotion, or perfume-unless you are told to do so by your health care provider. Only use lotions that are recommended by the manufacturer. 3. Put on clean clothes or pajamas. Contact a health care provider if:  Your skin gets irritated after scrubbing.  You have questions about using your solution or cloth. Get help right away if:  Your eyes become very red or swollen.  Your eyes itch badly.  Your skin itches badly and is red or swollen.  Your hearing changes.  You have trouble seeing.  You have swelling or  tingling in your mouth or throat.  You have trouble breathing.  You swallow any chlorhexidine. Summary  Chlorhexidine gluconate (CHG) is a germ-killing (antiseptic) solution that is used to clean the skin. Cleaning your skin with CHG may help to lower your risk for infection.  You may be given CHG to use for bathing. It may be in a bottle or in a prepackaged cloth to use on your skin. Carefully follow your health care provider's instructions and the instructions on the product label.  Do not use CHG if you have a chlorhexidine allergy.  Contact your health care provider if your skin gets irritated after scrubbing. This information is not intended to replace advice given to you by your health care provider. Make sure you discuss any questions you have with your health care provider. Document Released: 09/03/2012 Document Revised: 02/26/2019 Document Reviewed: 11/07/2017 Elsevier Patient Education  2020 [employer].

## 2019-08-13 DIAGNOSIS — K210 Gastro-esophageal reflux disease with esophagitis: Secondary | ICD-10-CM | POA: Diagnosis not present

## 2019-08-13 DIAGNOSIS — G43909 Migraine, unspecified, not intractable, without status migrainosus: Secondary | ICD-10-CM | POA: Diagnosis not present

## 2019-08-13 DIAGNOSIS — K589 Irritable bowel syndrome without diarrhea: Secondary | ICD-10-CM | POA: Diagnosis not present

## 2019-08-13 DIAGNOSIS — E782 Mixed hyperlipidemia: Secondary | ICD-10-CM | POA: Diagnosis not present

## 2019-08-17 ENCOUNTER — Encounter (HOSPITAL_COMMUNITY)
Admission: RE | Admit: 2019-08-17 | Discharge: 2019-08-17 | Disposition: A | Discharge status: Home / Self Care | Payer: Medicare Other | Source: Ambulatory Visit | Attending: Obstetrics & Gynecology | Admitting: Obstetrics & Gynecology

## 2019-08-17 ENCOUNTER — Other Ambulatory Visit: Payer: Self-pay

## 2019-08-17 ENCOUNTER — Other Ambulatory Visit (HOSPITAL_COMMUNITY)
Admission: RE | Admit: 2019-08-17 | Discharge: 2019-08-17 | Disposition: A | Discharge status: Home / Self Care | Payer: Medicare Other | Source: Ambulatory Visit | Attending: Obstetrics & Gynecology | Admitting: Obstetrics & Gynecology

## 2019-08-17 ENCOUNTER — Other Ambulatory Visit: Payer: Self-pay | Admitting: *Deleted

## 2019-08-17 ENCOUNTER — Encounter (HOSPITAL_COMMUNITY): Payer: Self-pay

## 2019-08-17 DIAGNOSIS — Z01812 Encounter for preprocedural laboratory examination: Secondary | ICD-10-CM | POA: Insufficient documentation

## 2019-08-17 DIAGNOSIS — K219 Gastro-esophageal reflux disease without esophagitis: Secondary | ICD-10-CM | POA: Diagnosis not present

## 2019-08-17 DIAGNOSIS — D071 Carcinoma in situ of vulva: Secondary | ICD-10-CM | POA: Diagnosis not present

## 2019-08-17 DIAGNOSIS — F4310 Post-traumatic stress disorder, unspecified: Secondary | ICD-10-CM | POA: Diagnosis not present

## 2019-08-17 DIAGNOSIS — Z20828 Contact with and (suspected) exposure to other viral communicable diseases: Secondary | ICD-10-CM | POA: Insufficient documentation

## 2019-08-17 DIAGNOSIS — F329 Major depressive disorder, single episode, unspecified: Secondary | ICD-10-CM | POA: Diagnosis not present

## 2019-08-17 DIAGNOSIS — N909 Noninflammatory disorder of vulva and perineum, unspecified: Secondary | ICD-10-CM | POA: Diagnosis present

## 2019-08-17 DIAGNOSIS — E7800 Pure hypercholesterolemia, unspecified: Secondary | ICD-10-CM | POA: Diagnosis not present

## 2019-08-17 DIAGNOSIS — Z96643 Presence of artificial hip joint, bilateral: Secondary | ICD-10-CM | POA: Diagnosis not present

## 2019-08-17 DIAGNOSIS — Z21 Asymptomatic human immunodeficiency virus [HIV] infection status: Secondary | ICD-10-CM | POA: Diagnosis not present

## 2019-08-17 DIAGNOSIS — Z8619 Personal history of other infectious and parasitic diseases: Secondary | ICD-10-CM | POA: Diagnosis not present

## 2019-08-17 DIAGNOSIS — F419 Anxiety disorder, unspecified: Secondary | ICD-10-CM | POA: Diagnosis not present

## 2019-08-17 DIAGNOSIS — Z79899 Other long term (current) drug therapy: Secondary | ICD-10-CM | POA: Diagnosis not present

## 2019-08-17 DIAGNOSIS — K7460 Unspecified cirrhosis of liver: Secondary | ICD-10-CM | POA: Diagnosis not present

## 2019-08-17 LAB — URINALYSIS, ROUTINE W REFLEX MICROSCOPIC
Bacteria, UA: NONE SEEN
Bilirubin Urine: NEGATIVE
Glucose, UA: NEGATIVE mg/dL
Hgb urine dipstick: NEGATIVE
Ketones, ur: NEGATIVE mg/dL
Nitrite: NEGATIVE
Protein, ur: NEGATIVE mg/dL
Specific Gravity, Urine: 1.004 — ABNORMAL LOW (ref 1.005–1.030)
pH: 6 (ref 5.0–8.0)

## 2019-08-17 LAB — COMPREHENSIVE METABOLIC PANEL
ALT: 13 U/L (ref 0–44)
AST: 22 U/L (ref 15–41)
Albumin: 3.8 g/dL (ref 3.5–5.0)
Alkaline Phosphatase: 108 U/L (ref 38–126)
Anion gap: 10 (ref 5–15)
BUN: 10 mg/dL (ref 6–20)
CO2: 23 mmol/L (ref 22–32)
Calcium: 9 mg/dL (ref 8.9–10.3)
Chloride: 107 mmol/L (ref 98–111)
Creatinine, Ser: 0.93 mg/dL (ref 0.44–1.00)
GFR calc Af Amer: >60 mL/min (ref >60)
GFR calc non Af Amer: >60 mL/min (ref >60)
Glucose, Bld: 82 mg/dL (ref 70–99)
Potassium: 3.6 mmol/L (ref 3.5–5.1)
Sodium: 140 mmol/L (ref 135–145)
Total Bilirubin: 0.3 mg/dL (ref 0.3–1.2)
Total Protein: 7.5 g/dL (ref 6.5–8.1)

## 2019-08-17 LAB — SARS CORONAVIRUS 2 (TAT 6-24 HRS): SARS Coronavirus 2: NEGATIVE

## 2019-08-17 LAB — CBC
HCT: 38 % (ref 36.0–46.0)
Hemoglobin: 12 g/dL (ref 12.0–15.0)
MCH: 30.4 pg (ref 26.0–34.0)
MCHC: 31.6 g/dL (ref 30.0–36.0)
MCV: 96.2 fL (ref 80.0–100.0)
Platelets: 212 10*3/uL (ref 150–400)
RBC: 3.95 MIL/uL (ref 3.87–5.11)
RDW: 13.8 % (ref 11.5–15.5)
WBC: 3.7 10*3/uL — ABNORMAL LOW (ref 4.0–10.5)
nRBC: 0 % (ref 0.0–0.2)

## 2019-08-19 ENCOUNTER — Other Ambulatory Visit: Payer: Self-pay

## 2019-08-19 ENCOUNTER — Ambulatory Visit (HOSPITAL_COMMUNITY): Payer: Medicare Other | Admitting: Anesthesiology

## 2019-08-19 ENCOUNTER — Ambulatory Visit (HOSPITAL_COMMUNITY)
Admission: RE | Admit: 2019-08-19 | Discharge: 2019-08-19 | Disposition: A | Discharge status: Home / Self Care | Payer: Medicare Other | Attending: Obstetrics & Gynecology | Admitting: Obstetrics & Gynecology

## 2019-08-19 ENCOUNTER — Encounter (HOSPITAL_COMMUNITY): Payer: Self-pay | Admitting: *Deleted

## 2019-08-19 ENCOUNTER — Encounter (HOSPITAL_COMMUNITY)
Admission: RE | Disposition: A | Discharge status: Home / Self Care | Payer: Self-pay | Source: Home / Self Care | Attending: Obstetrics & Gynecology

## 2019-08-19 DIAGNOSIS — Z8619 Personal history of other infectious and parasitic diseases: Secondary | ICD-10-CM | POA: Diagnosis not present

## 2019-08-19 DIAGNOSIS — F419 Anxiety disorder, unspecified: Secondary | ICD-10-CM | POA: Insufficient documentation

## 2019-08-19 DIAGNOSIS — F4310 Post-traumatic stress disorder, unspecified: Secondary | ICD-10-CM | POA: Insufficient documentation

## 2019-08-19 DIAGNOSIS — Z96643 Presence of artificial hip joint, bilateral: Secondary | ICD-10-CM | POA: Insufficient documentation

## 2019-08-19 DIAGNOSIS — K7460 Unspecified cirrhosis of liver: Secondary | ICD-10-CM | POA: Diagnosis not present

## 2019-08-19 DIAGNOSIS — K219 Gastro-esophageal reflux disease without esophagitis: Secondary | ICD-10-CM | POA: Diagnosis not present

## 2019-08-19 DIAGNOSIS — F329 Major depressive disorder, single episode, unspecified: Secondary | ICD-10-CM | POA: Insufficient documentation

## 2019-08-19 DIAGNOSIS — E7800 Pure hypercholesterolemia, unspecified: Secondary | ICD-10-CM | POA: Diagnosis not present

## 2019-08-19 DIAGNOSIS — Z21 Asymptomatic human immunodeficiency virus [HIV] infection status: Secondary | ICD-10-CM | POA: Diagnosis not present

## 2019-08-19 DIAGNOSIS — Z20828 Contact with and (suspected) exposure to other viral communicable diseases: Secondary | ICD-10-CM | POA: Diagnosis not present

## 2019-08-19 DIAGNOSIS — D071 Carcinoma in situ of vulva: Secondary | ICD-10-CM

## 2019-08-19 DIAGNOSIS — Z79899 Other long term (current) drug therapy: Secondary | ICD-10-CM | POA: Diagnosis not present

## 2019-08-19 HISTORY — PX: VULVECTOMY PARTIAL: SHX6187

## 2019-08-19 SURGERY — VULVECTOMY, PARTIAL
Anesthesia: General | Site: Vulva

## 2019-08-19 MED ORDER — CEFAZOLIN SODIUM-DEXTROSE 2-4 GM/100ML-% IV SOLN
2.0000 g | INTRAVENOUS | Status: AC
  Administered 2019-08-19: 2 g via INTRAVENOUS

## 2019-08-19 MED ORDER — FENTANYL CITRATE (PF) 100 MCG/2ML IJ SOLN
INTRAMUSCULAR | Status: AC
  Filled 2019-08-19: qty 2

## 2019-08-19 MED ORDER — ROCURONIUM BROMIDE 50 MG/5ML IV SOSY
PREFILLED_SYRINGE | INTRAVENOUS | Status: DC | PRN
  Administered 2019-08-19: 40 mg via INTRAVENOUS

## 2019-08-19 MED ORDER — ONDANSETRON HCL 4 MG/2ML IJ SOLN: 4.0000 mg | Freq: Once | INTRAMUSCULAR | Status: DC | PRN

## 2019-08-19 MED ORDER — 0.9 % SODIUM CHLORIDE (POUR BTL) OPTIME
TOPICAL | Status: DC | PRN
  Administered 2019-08-19: 1000 mL

## 2019-08-19 MED ORDER — PROPOFOL 10 MG/ML IV BOLUS
INTRAVENOUS | Status: DC | PRN
  Administered 2019-08-19: 130 mg via INTRAVENOUS

## 2019-08-19 MED ORDER — MIDAZOLAM HCL 2 MG/2ML IJ SOLN
INTRAMUSCULAR | Status: AC
  Filled 2019-08-19: qty 2

## 2019-08-19 MED ORDER — CEFAZOLIN SODIUM-DEXTROSE 2-4 GM/100ML-% IV SOLN
INTRAVENOUS | Status: AC
  Filled 2019-08-19: qty 100

## 2019-08-19 MED ORDER — MEPERIDINE HCL 50 MG/ML IJ SOLN: 6.2500 mg | INTRAMUSCULAR | Status: DC | PRN

## 2019-08-19 MED ORDER — KETOROLAC TROMETHAMINE 10 MG PO TABS: 10.0000 mg | ORAL_TABLET | Freq: Three times a day (TID) | ORAL | 0 refills | Status: DC | PRN

## 2019-08-19 MED ORDER — LIDOCAINE 2% (20 MG/ML) 5 ML SYRINGE
INTRAMUSCULAR | Status: DC | PRN
  Administered 2019-08-19: 40 mg via INTRAVENOUS

## 2019-08-19 MED ORDER — ONDANSETRON 8 MG PO TBDP: 8.0000 mg | ORAL_TABLET | Freq: Three times a day (TID) | ORAL | 0 refills | Status: DC | PRN

## 2019-08-19 MED ORDER — BUPIVACAINE LIPOSOME 1.3 % IJ SUSP
INTRAMUSCULAR | Status: AC
  Filled 2019-08-19: qty 20

## 2019-08-19 MED ORDER — KETOROLAC TROMETHAMINE 30 MG/ML IJ SOLN
INTRAMUSCULAR | Status: AC
  Filled 2019-08-19: qty 1

## 2019-08-19 MED ORDER — BUPIVACAINE LIPOSOME 1.3 % IJ SUSP: 20.0000 mL | Freq: Once | INTRAMUSCULAR | Status: DC

## 2019-08-19 MED ORDER — SUGAMMADEX SODIUM 200 MG/2ML IV SOLN
INTRAVENOUS | Status: DC | PRN
  Administered 2019-08-19: 200 mg via INTRAVENOUS

## 2019-08-19 MED ORDER — ARTIFICIAL TEARS OPHTHALMIC OINT
TOPICAL_OINTMENT | OPHTHALMIC | Status: AC
  Filled 2019-08-19: qty 3.5

## 2019-08-19 MED ORDER — PROPOFOL 10 MG/ML IV BOLUS
INTRAVENOUS | Status: AC
  Filled 2019-08-19: qty 40

## 2019-08-19 MED ORDER — HYDROCODONE-ACETAMINOPHEN 5-325 MG PO TABS: 1.0000 | ORAL_TABLET | Freq: Four times a day (QID) | ORAL | 0 refills | Status: DC | PRN

## 2019-08-19 MED ORDER — LIDOCAINE 2% (20 MG/ML) 5 ML SYRINGE
INTRAMUSCULAR | Status: AC
  Filled 2019-08-19: qty 5

## 2019-08-19 MED ORDER — GLYCOPYRROLATE PF 0.2 MG/ML IJ SOSY
PREFILLED_SYRINGE | INTRAMUSCULAR | Status: AC
  Filled 2019-08-19: qty 1

## 2019-08-19 MED ORDER — MIDAZOLAM HCL 5 MG/5ML IJ SOLN
INTRAMUSCULAR | Status: DC | PRN
  Administered 2019-08-19: 2 mg via INTRAVENOUS

## 2019-08-19 MED ORDER — FENTANYL CITRATE (PF) 100 MCG/2ML IJ SOLN
INTRAMUSCULAR | Status: DC | PRN
  Administered 2019-08-19 (×4): 50 ug via INTRAVENOUS

## 2019-08-19 MED ORDER — MIDAZOLAM HCL 2 MG/2ML IJ SOLN
2.0000 mg | Freq: Once | INTRAMUSCULAR | Status: AC
  Administered 2019-08-19: 2 mg via INTRAVENOUS

## 2019-08-19 MED ORDER — LACTATED RINGERS IV SOLN
Freq: Once | INTRAVENOUS | Status: AC
  Administered 2019-08-19: 1000 mL via INTRAVENOUS

## 2019-08-19 MED ORDER — HYDROMORPHONE HCL 1 MG/ML IJ SOLN
0.5000 mg | INTRAMUSCULAR | Status: DC | PRN
  Administered 2019-08-19: 0.5 mg via INTRAVENOUS
  Filled 2019-08-19: qty 0.5

## 2019-08-19 MED ORDER — LACTATED RINGERS IV SOLN
INTRAVENOUS | Status: DC | PRN
  Administered 2019-08-19: 11:00:00 via INTRAVENOUS

## 2019-08-19 MED ORDER — KETOROLAC TROMETHAMINE 30 MG/ML IJ SOLN
30.0000 mg | Freq: Once | INTRAMUSCULAR | Status: AC
  Administered 2019-08-19: 30 mg via INTRAVENOUS

## 2019-08-19 SURGICAL SUPPLY — 29 items
ATCH SMKEVC FLXB CAUT HNDSWH (FILTER) ×2 IMPLANT
CLOTH BEACON ORANGE TIMEOUT ST (SAFETY) ×3 IMPLANT
COVER LIGHT HANDLE STERIS (MISCELLANEOUS) ×6 IMPLANT
COVER WAND RF STERILE (DRAPES) ×3 IMPLANT
ELECT REM PT RETURN 9FT ADLT (ELECTROSURGICAL) ×3
ELECTRODE REM PT RTRN 9FT ADLT (ELECTROSURGICAL) ×2 IMPLANT
EVACUATOR SMOKE ACCUVAC VALLEY (FILTER) ×3
GLOVE BIOGEL PI IND STRL 7.0 (GLOVE) ×4 IMPLANT
GLOVE BIOGEL PI IND STRL 8 (GLOVE) ×2 IMPLANT
GLOVE BIOGEL PI INDICATOR 7.0 (GLOVE) ×2
GLOVE BIOGEL PI INDICATOR 8 (GLOVE) ×1
GLOVE ECLIPSE 6.5 STRL STRAW (GLOVE) ×3 IMPLANT
GLOVE ECLIPSE 8.0 STRL XLNG CF (GLOVE) ×3 IMPLANT
GOWN STRL REUS W/TWL LRG LVL3 (GOWN DISPOSABLE) ×3 IMPLANT
GOWN STRL REUS W/TWL XL LVL3 (GOWN DISPOSABLE) ×3 IMPLANT
KIT TURNOVER KIT A (KITS) ×3 IMPLANT
MANIFOLD NEPTUNE II (INSTRUMENTS) ×3 IMPLANT
NEEDLE 22X1 1/2 OR ONLY (MISCELLANEOUS) ×1
NEEDLE 22X1.5 STRL (OR ONLY) (MISCELLANEOUS) ×2 IMPLANT
NS IRRIG 1000ML POUR BTL (IV SOLUTION) ×3 IMPLANT
PACK PERI GYN (CUSTOM PROCEDURE TRAY) ×3 IMPLANT
PAD ARMBOARD 7.5X6 YLW CONV (MISCELLANEOUS) ×3 IMPLANT
PREFILTER SMOKE EVAC (FILTER) ×3 IMPLANT
SET BASIN LINEN APH (SET/KITS/TRAYS/PACK) ×3 IMPLANT
SUT MNCRL+ AB 3-0 CT1 36 (SUTURE) ×4 IMPLANT
SUT MON AB 3-0 SH 27 (SUTURE) ×3 IMPLANT
SUT MONOCRYL AB 3-0 CT1 36IN (SUTURE) ×6
SYR 20ML LL LF (SYRINGE) ×3 IMPLANT
TUBING SMOKE EVAC CO2 (TUBING) ×3 IMPLANT

## 2019-08-19 NOTE — Discharge Instructions (Signed)
Vulvectomy, Care After This sheet gives you information about how to care for yourself after your procedure. Your health care provider may also give you more specific instructions. If you have problems or questions, contact your health care provider. What can I expect after the procedure? After the procedure, it is common to have:  Vaginal pain.  Vaginal numbness.  Vaginal swelling.  Bloody vaginal discharge. Follow these instructions at home: Activity   Rest as told by your health care provider.  Do not lift, push, or pull more than 5 lb (2.3 kg), or the limit that you are told, until your health care provider says that it is safe.  Avoid activities that take a lot of effort for as long as told by your health care provider. This includes any exercise.  Raise (elevate) your legs while sitting or lying down.  Avoid standing or sitting in one place for long periods of time.  Do not cross your legs, especially when sitting, until your health care provider approves.  Return to your normal activities as told by your health care provider. Ask your health care provider what activities are safe for you. Bathing   Do not take baths, swim, or use a hot tub until your health care provider approves. Ask your health care provider if you can take showers. You may only be allowed to take sponge baths.  After passing urine or a bowel movement, wipe yourself from front to back and clean your vaginal area using a spray bottle.  If told by your health care provider, take a sitz bath to help with discomfort. This is a warm water bath you take while sitting down. ? Do this 3-4 times a day, or as often as told by your health care provider. ? The water should only come up to your hips and cover your buttocks. ? You may pat the area dry with a soft, clean towel. ? If needed, you may then gently dry the area with a hair dryer on a cool setting for 5-10 minutes. An enclosed box fan may also be used to  gently dry the area. Incision care   Follow instructions from your health care provider about how to take care of your incision areas. Make sure you: ? Wash your hands with soap and water before and after you change your bandages (dressing). If soap and water are not available, use hand sanitizer. ? Change your dressing as told by your health care provider. ? Leave stitches (sutures), skin glue, adhesive strips, or surgical clips in place. These skin closures may need to stay in place for 2 weeks or longer. If adhesive strip edges start to loosen and curl up, you may trim the loose edges. Do not remove adhesive strips completely unless your health care provider tells you to do that.  Check your incision areas every day for signs of infection. It may be helpful to use a handheld mirror to do this. Check for: ? Redness, swelling, or pain that has gotten worse. ? More fluid or blood. ? Warmth. ? Pus or a bad smell.  If you were sent home with a drain, take care of it as told by your health care provider. Lifestyle  Do not douche or use tampons until your health care provider approves.  Do not have sex until your health care provider approves. Tell your health care provider if you have pain or numbness when you return to sexual activity.  Wear cotton underwear and comfortable, loose-fitting clothing.  Medicines  Take over-the-counter and prescription medicines only as told by your health care provider.  Ask your health care provider if the medicine prescribed to you: ? Requires you to avoid driving or using heavy machinery. ? Can cause constipation. You may need to take these actions to prevent or treat constipation:  Take over-the-counter or prescription medicines.  Eat foods that are high in fiber, such as beans, whole grains, and fresh fruits and vegetables.  Limit foods that are high in fat and processed sugars, such as fried or sweet foods. General instructions  Do not drive  until your health care provider says that it is safe.  Drink enough fluid to keep your urine pale yellow.  Wear compression stockings as told by your health care provider. These stockings help to prevent blood clots and reduce swelling in your legs.  Keep all follow-up visits as told by your health care provider. This is important. Contact a health care provider if:  You have any of these problems in the incision area: ? More redness, swelling, or pain around the incision. ? More fluid or blood coming from the incision. ? Pus or a bad smell coming from the incision. ? The incision feels warm to the touch. ? The incision breaks open.  You have a fever.  You have painful or bloody urination.  You feel nauseous or you vomit.  You have diarrhea.  You develop constipation.  You develop a rash.  You feel dizzy or light-headed.  You have pain that does not get better with medicine. Get help right away if you:  Faint.  Have leg or chest pain.  Have abdominal pain.  Have shortness of breath. Summary  After the procedure, it is common to have some pain, numbness, or swelling in the vaginal area. It is also common to have some bloody vaginal discharge.  Do not lift, push, or pull more than 5 lb (2.3 kg), or the limit that you are told, or engage in activities that take a lot of effort until your health care provider approves.  Follow instructions from your health care provider about how to take care of your incision.  Keep all follow-up visits as told by your health care provider. This is important. This information is not intended to replace advice given to you by your health care provider. Make sure you discuss any questions you have with your health care provider. Document Released: 07/24/2004 Document Revised: 01/06/2019 Document Reviewed: 01/07/2019 Elsevier Patient Education  2020 [employer]

## 2019-08-19 NOTE — Anesthesia Procedure Notes (Signed)
Procedure Name: Intubation Date/Time: 08/19/2019 11:23 AM Performed by: Andree Elk, Imari Reen A, CRNA Pre-anesthesia Checklist: Patient identified, Patient being monitored, Timeout performed, Emergency Drugs available and Suction available Patient Re-evaluated:Patient Re-evaluated prior to induction Oxygen Delivery Method: Circle system utilized Preoxygenation: Pre-oxygenation with 100% oxygen Induction Type: IV induction Ventilation: Mask ventilation without difficulty Laryngoscope Size: Mac and 3 Grade View: Grade I Tube type: Oral Tube size: 7.0 mm Number of attempts: 1 Airway Equipment and Method: Stylet Placement Confirmation: ETT inserted through vocal cords under direct vision,  positive ETCO2 and breath sounds checked- equal and bilateral Secured at: 21 cm Tube secured with: Tape Dental Injury: Teeth and Oropharynx as per pre-operative assessment

## 2019-08-19 NOTE — Op Note (Signed)
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  Essentially the lesion was midline and extended equally left and right.  It is outside the hymeneal ring and involves the perineum.  It is concerning for VIN 3 does not appear to be invasive.  I did get surgical margins visually which frequently means there are microscopically  involved surgical margins.  Description of operation: Patient was taken to the operating room where she was placed in the supine position underwent general tracheal anesthesia She was placed in dorsolithotomy position in candycane stirrups The lower abdomen vulva perineum and perianal areas and inner thighs were prepped and draped in usual sterile fashion The the lesion has dense leukoplakia and a visual margin was obtained surgically A 15 blade was used and a wide excision of the entire lesion was performed including subcutaneous tissue as well A 3 layer perineal closure was performed involving deep intermediate and cutaneous tissue layers All sutures placed were interrupted 3-0 Monocryl Hemostasis was also achieved with the electrocautery unit There was good return of anatomy and the surgical site site was hemostatic The specimen was sent to pathology  Blood loss for the procedure was 20 cc  The patient received 2 g of Ancef and 30 mg of Toradol preoperatively  She is taken recovery in good stable condition all counts were correct x3  Florian Buff, MD 08/19/2019 12:05 PM

## 2019-08-19 NOTE — Transfer of Care (Signed)
Immediate Anesthesia Transfer of Care Note  Patient: Laura Jones  Procedure(s) Performed: VULVECTOMY PARTIAL (N/A Vulva)  Patient Location: PACU  Anesthesia Type:General  Level of Consciousness: awake  Airway & Oxygen Therapy: Patient Spontanous Breathing  Post-op Assessment: Report given to RN  Post vital signs: Reviewed  Last Vitals:  Vitals Value Taken Time  BP 118/67 08/19/19 1216  Temp    Pulse 91 08/19/19 1219  Resp 16 08/19/19 1219  SpO2 100 % 08/19/19 1219  Vitals shown include unvalidated device data.  Last Pain:  Vitals:   08/19/19 0949  TempSrc: Oral  PainSc: 0-No pain      Patients Stated Pain Goal: 6 (61/60/73 [id number])  Complications: No apparent anesthesia complications

## 2019-08-19 NOTE — Interval H&P Note (Signed)
History and Physical Interval Note:  08/19/2019 11:08 AM  Laura Jones  has presented today for surgery, with the diagnosis of Vulvar Lesion.  The various methods of treatment have been discussed with the patient and family. After consideration of risks, benefits and other options for treatment, the patient has consented to  Procedure(s): VULVAR BIOPSY (N/A) VULVECTOMY PARTIAL (N/A) as a surgical intervention.  The patient's history has been reviewed, patient examined, no change in status, stable for surgery.  I have reviewed the patient's chart and labs.  Questions were answered to the patient's satisfaction.     Florian Buff

## 2019-08-19 NOTE — H&P (Signed)
Preoperative History and Physical  Laura Jones Demmon is a 48 y.o. G2P1011 with Patient's last menstrual period was 12/25/2003. admitted for a vulvar biopsy with laser partial vulvectomy of perineal warty dysplastic lesion.  PMH:      Past Medical History:  Diagnosis Date  . Anemia    hx of   . Anxiety   . Arthritis    left knee  . Avascular necrosis of bone of hip, left (HCC)   . Bulging lumbar disc   . Depression   . Dyspnea    with excertion  . GERD (gastroesophageal reflux disease)   . Hepatitis    C treated 2 years ago went undetected  . History of kidney stones   . HIV (human immunodeficiency virus infection) (HCC) 07/29/2017  . HIV infection (HCC)   . Insomnia   . Migraines   . Nerve pain   . Perimenopausal   . Porphyria (HCC)   . PTSD (post-traumatic stress disorder)   . Spinal stenosis of lumbosacral region   . Tendonitis    right arm   . Trauma   PSH:       Past Surgical History:  Procedure Laterality Date  . ABDOMINAL HYSTERECTOMY    . ANKLE SURGERY Left 01/26/2019  . CESAREAN SECTION    . ESOPHAGOGASTRODUODENOSCOPY     x2  . INCONTINENCE SURGERY    . JOINT REPLACEMENT     left hip replacement Dr. Lequita HaltAluisio 06-18-18  . TOTAL HIP ARTHROPLASTY Right 02/2013  . TOTAL HIP ARTHROPLASTY Left 06/18/2018   Procedure: LEFT TOTAL HIP ARTHROPLASTY ANTERIOR APPROACH; Surgeon: Ollen GrossAluisio, Frank, MD; Location: [facility]; Service: Orthopedics; Laterality: Left;  . TUBAL LIGATION    . URETHRAL SLING    POb/GynH:          OB History     Gravida Para Term Preterm AB Living   2 1 1  1 1     SAB TAB Ectopic Multiple Live Births    1    1      SH:  Social History       Tobacco Use  . Smoking status: Never Smoker  . Smokeless tobacco: Never Used  Substance Use Topics  . Alcohol use: No  . Drug use: No  FH:       Family History  Problem Relation Age of Onset  . COPD Mother   . Osteoporosis Mother   . Hypertension Father   . Healthy Sister   . Heart disease  Maternal Grandmother   . Heart disease Maternal Grandfather   Allergies:       Allergies  Allergen Reactions  . Levaquin [Levofloxacin] Rash    Difficulty breathing, mouth swelling/rash  . Fish Oil Nausea And Vomiting  . Compazine [Prochlorperazine Edisylate]     Muscle twitching  . Droperidol     uncontrolled muscle movements   . Imitrex [Sumatriptan]     Difficulty breathing, chest pain; All "triptans"  . Reglan [Metoclopramide]     Uncontrolled twitching  . Sulfa Antibiotics Hives    Hives   . Thorazine [Chlorpromazine]     Uncontrolled muscle twitching  Medications:  Current Outpatient Medications:  . atorvastatin (LIPITOR) 10 MG tablet, Take 10 mg by mouth at bedtime., Disp: , Rfl: 0  . bictegravir-emtricitabine-tenofovir AF (BIKTARVY) 50-200-25 MG TABS tablet, Take 1 tablet by mouth daily., Disp: 30 tablet, Rfl: 6  . buPROPion (ZYBAN) 150 MG 12 hr tablet, Take 150 mg by mouth 2 (two) times daily., Disp: ,  Rfl:  . busPIRone (BUSPAR) 15 MG tablet, Take 15 mg by mouth 2 (two) times daily., Disp: , Rfl: 0  . cholecalciferol (VITAMIN D) 1000 units tablet, Take 4,000 Units by mouth daily., Disp: , Rfl:  . citalopram (CELEXA) 40 MG tablet, Take 40 mg by mouth daily., Disp: , Rfl:  . clobetasol ointment (TEMOVATE) 0.05 %, Apply 1 application topically 2 (two) times daily., Disp: 30 g, Rfl: 2  . dexlansoprazole (DEXILANT) 60 MG capsule, Take 60 mg by mouth daily., Disp: , Rfl:  . dicyclomine (BENTYL) 10 MG capsule, , Disp: , Rfl:  . docusate sodium (STOOL SOFTENER) 100 MG capsule, , Disp: , Rfl:  . fluconazole (DIFLUCAN) 150 MG tablet, as needed. , Disp: , Rfl:  . loperamide (IMODIUM A-D) 2 MG tablet, Take 2 mg by mouth as needed for diarrhea or loose stools., Disp: , Rfl:  . ondansetron (ZOFRAN) 8 MG tablet, Take by mouth every 8 (eight) hours as needed for nausea or vomiting., Disp: , Rfl:  . senna (SENOKOT) 8.6 MG tablet, , Disp: , Rfl:  . tiZANidine (ZANAFLEX) 4 MG tablet, Take  4 mg by mouth at bedtime., Disp: , Rfl:  . traZODone (DESYREL) 100 MG tablet, Take 100-200 mg by mouth at bedtime., Disp: , Rfl:  . vitamin B-12 (CYANOCOBALAMIN) 500 MCG tablet, Take 500 mcg by mouth daily. Two tablets at night, Disp: , Rfl:  Review of Systems:  Review of Systems  Constitutional: Negative for fever, chills, weight loss, malaise/fatigue and diaphoresis.  HENT: Negative for hearing loss, ear pain, nosebleeds, congestion, sore throat, neck pain, tinnitus and ear discharge.  Eyes: Negative for blurred vision, double vision, photophobia, pain, discharge and redness.  Respiratory: Negative for cough, hemoptysis, sputum production, shortness of breath, wheezing and stridor.  Cardiovascular: Negative for chest pain, palpitations, orthopnea, claudication, leg swelling and PND.  Gastrointestinal: Positive for abdominal pain. Negative for heartburn, nausea, vomiting, diarrhea, constipation, blood in stool and melena.  Genitourinary: Negative for dysuria, urgency, frequency, hematuria and flank pain.  Musculoskeletal: Negative for myalgias, back pain, joint pain and falls.  Skin: Negative for itching and rash.  Neurological: Negative for dizziness, tingling, tremors, sensory change, speech change, focal weakness, seizures, loss of consciousness, weakness and headaches.  Endo/Heme/Allergies: Negative for environmental allergies and polydipsia. Does not bruise/bleed easily.  Psychiatric/Behavioral: Negative for depression, suicidal ideas, hallucinations, memory loss and substance abuse. The patient is not nervous/anxious and does not have insomnia.  PHYSICAL EXAM:  Blood pressure 115/73, pulse 81, height 5\' 1"  (1.549 m), weight 174 lb 8 oz (79.2 kg), last menstrual period 12/25/2003.  Vitals reviewed.  Constitutional: She is oriented to person, place, and time. She appears well-developed and well-nourished.  HENT:  Head: Normocephalic and atraumatic.  Right Ear: External ear normal.  Left  Ear: External ear normal.  Nose: Nose normal.  Mouth/Throat: Oropharynx is clear and moist.  Eyes: Conjunctivae and EOM are normal. Pupils are equal, round, and reactive to light. Right eye exhibits no discharge. Left eye exhibits no discharge. No scleral icterus.  Neck: Normal range of motion. Neck supple. No tracheal deviation present. No thyromegaly present.  Cardiovascular: Normal rate, regular rhythm, normal heart sounds and intact distal pulses. Exam reveals no gallop and no friction rub.  No murmur heard.  Respiratory: Effort normal and breath sounds normal. No respiratory distress. She has no wheezes. She has no rales. She exhibits no tenderness.  GI: Soft. Bowel sounds are normal. She exhibits no distension and no mass. There is  tenderness. There is no rebound and no guarding.  Genitourinary:  Vulva is normal without lesions Vagina is pink moist without discharge Cervix normal in appearance and pap is normal Uterus is uterus absent Adnexa is negative with normal sized ovaries by sonogram  Musculoskeletal: Normal range of motion. She exhibits no edema and no tenderness.  Neurological: She is alert and oriented to person, place, and time. She has normal reflexes. She displays normal reflexes. No cranial nerve deficit. She exhibits normal muscle tone. Coordination normal.  Skin: Skin is warm and dry. No rash noted. No erythema. No pallor.  Psychiatric: She has a normal mood and affect. Her behavior is normal. Judgment and thought content normal.  Labs:  No results found for this or any previous visit (from the past 336 hour(s)).  EKG:  No orders found for this or any previous visit.  Imaging Studies:  Imaging Results    Assessment:    ICD-10-CM   1. Vulvar lesion  N90.89    warty dysplasitc lesion       Patient Active Problem List   Diagnosis Date Noted  . Hepatic cirrhosis ([place]) 05/27/2019  . Vulvar lesion 10/14/2018  . OA (osteoarthritis) of hip 06/18/2018  .  Hypercholesteremia 12/08/2017  . Hepatitis C virus infection cured after antiviral drug therapy 07/30/2017  . Acid reflux 07/30/2017  . HIV (human immunodeficiency virus infection) ([place]) 07/29/2017  Plan:  >Vulvar biopsy with partial lser vulvectomy 07/22/2019  Mertie Clause Eure  06/30/2019  10:29 AM

## 2019-08-19 NOTE — Anesthesia Postprocedure Evaluation (Signed)
Anesthesia Post Note Late entry for 1250  Patient: Laura Jones  Procedure(s) Performed: VULVECTOMY PARTIAL (N/A Vulva)  Patient location during evaluation: PACU Anesthesia Type: General Level of consciousness: awake and alert and oriented Pain management: pain level controlled Vital Signs Assessment: post-procedure vital signs reviewed and stable Respiratory status: spontaneous breathing Postop Assessment: no apparent nausea or vomiting Anesthetic complications: no     Last Vitals:  Vitals:   08/19/19 1245 08/19/19 1303  BP: (!) 110/50 114/75  Pulse: 85 82  Resp: 12 18  Temp:  36.8 C  SpO2: 97% 97%    Last Pain:  Vitals:   08/19/19 1303  TempSrc: Oral  PainSc: 3                  ADAMS, AMY A

## 2019-08-19 NOTE — Anesthesia Preprocedure Evaluation (Addendum)
Anesthesia Evaluation  Patient identified by MRN, date of birth, ID band Patient awake    Reviewed: Allergy & Precautions, NPO status , Patient's Chart, lab work & pertinent test results  Airway Mallampati: II  TM Distance: >3 FB Neck ROM: Full    Dental  (+) Missing, Poor Dentition, Chipped, Dental Advisory Given   Pulmonary shortness of breath (denied),    Pulmonary exam normal        Cardiovascular METS: 3 - Mets Normal cardiovascular exam Rhythm:Regular Rate:Normal     Neuro/Psych  Headaches, PSYCHIATRIC DISORDERS (PTSD) Anxiety Depression    GI/Hepatic GERD  Controlled and Medicated,(+) Hepatitis -, C  Endo/Other    Renal/GU      Musculoskeletal  (+) Arthritis  (back pain),   Abdominal   Peds  Hematology  (+) anemia , HIV,   Anesthesia Other Findings   Reproductive/Obstetrics                           Anesthesia Physical Anesthesia Plan  ASA: III  Anesthesia Plan: General   Post-op Pain Management:    Induction:   PONV Risk Score and Plan: 4 or greater and Ondansetron, Dexamethasone and Midazolam  Airway Management Planned: Oral ETT  Additional Equipment:   Intra-op Plan:   Post-operative Plan: Extubation in OR  Informed Consent: I have reviewed the patients History and Physical, chart, labs and discussed the procedure including the risks, benefits and alternatives for the proposed anesthesia with the patient or authorized representative who has indicated his/her understanding and acceptance.     Dental advisory given  Plan Discussed with: CRNA  Anesthesia Plan Comments:        Anesthesia Quick Evaluation

## 2019-08-20 ENCOUNTER — Encounter (HOSPITAL_COMMUNITY): Payer: Self-pay | Admitting: Obstetrics & Gynecology

## 2019-08-25 ENCOUNTER — Other Ambulatory Visit: Payer: Self-pay | Admitting: Obstetrics & Gynecology

## 2019-08-25 ENCOUNTER — Telehealth: Payer: Self-pay | Admitting: Obstetrics & Gynecology

## 2019-08-25 MED ORDER — HYDROCODONE-ACETAMINOPHEN 5-325 MG PO TABS: 1.0000 | ORAL_TABLET | Freq: Four times a day (QID) | ORAL | 0 refills | Status: DC | PRN

## 2019-08-25 NOTE — Telephone Encounter (Signed)
Pt requesting a refill of toradol and hydrocodone.

## 2019-08-26 ENCOUNTER — Telehealth: Payer: Self-pay | Admitting: Obstetrics & Gynecology

## 2019-08-26 NOTE — Telephone Encounter (Signed)
Pt states she was to have a post op appt this Friday with Eure. Pt is wanting to see if she can be worked in.

## 2019-08-28 ENCOUNTER — Ambulatory Visit (INDEPENDENT_AMBULATORY_CARE_PROVIDER_SITE_OTHER): Payer: Medicare Other | Admitting: Obstetrics & Gynecology

## 2019-08-28 ENCOUNTER — Encounter: Payer: Self-pay | Admitting: Obstetrics & Gynecology

## 2019-08-28 ENCOUNTER — Other Ambulatory Visit: Payer: Self-pay

## 2019-08-28 VITALS — BP 126/75 | HR 98 | Ht 61.0 in | Wt 170.0 lb

## 2019-08-28 DIAGNOSIS — Z9889 Other specified postprocedural states: Secondary | ICD-10-CM

## 2019-08-28 NOTE — Progress Notes (Signed)
  HPI: Patient returns for routine postoperative follow-up having undergone partial vulvectomy  on 08/19/2019.  The patient's immediate postoperative recovery has been unremarkable. Since hospital discharge the patient reports no problems.   Current Outpatient Medications: atorvastatin (LIPITOR) 10 MG tablet, Take 10 mg by mouth at bedtime., Disp: , Rfl: 0 bictegravir-emtricitabine-tenofovir AF (BIKTARVY) 50-200-25 MG TABS tablet, Take 1 tablet by mouth daily., Disp: 30 tablet, Rfl: 6 buPROPion (ZYBAN) 150 MG 12 hr tablet, Take 150 mg by mouth 2 (two) times daily., Disp: , Rfl:  busPIRone (BUSPAR) 15 MG tablet, Take 15 mg by mouth 2 (two) times daily., Disp: , Rfl: 0 Cholecalciferol (VITAMIN D) 50 MCG (2000 UT) tablet, Take 4,000 Units by mouth daily. , Disp: , Rfl:  citalopram (CELEXA) 40 MG tablet, Take 40 mg by mouth daily., Disp: , Rfl:  clobetasol ointment (TEMOVATE) 0.24 %, Apply 1 application topically 2 (two) times daily. (Patient taking differently: Apply 1 application topically 2 (two) times daily as needed (itching). ), Disp: 30 g, Rfl: 2 dexlansoprazole (DEXILANT) 60 MG capsule, Take 60 mg by mouth daily., Disp: , Rfl:  dicyclomine (BENTYL) 10 MG capsule, Take 10 mg by mouth 2 (two) times daily. , Disp: , Rfl:  docusate sodium (STOOL SOFTENER) 100 MG capsule, Take 100 mg by mouth daily. , Disp: , Rfl:  HYDROcodone-acetaminophen (NORCO/VICODIN) 5-325 MG tablet, Take 1 tablet by mouth every 6 (six) hours as needed., Disp: 15 tablet, Rfl: 0 ketorolac (TORADOL) 10 MG tablet, TAKE 1 TABLET (10 MG TOTAL) BY MOUTH EVERY 8 (EIGHT) HOURS AS NEEDED., Disp: 15 tablet, Rfl: 0 loperamide (IMODIUM A-D) 2 MG tablet, Take 2 mg by mouth as needed for diarrhea or loose stools., Disp: , Rfl:  ondansetron (ZOFRAN ODT) 8 MG disintegrating tablet, Take 1 tablet (8 mg total) by mouth every 8 (eight) hours as needed for nausea or vomiting., Disp: 20 tablet, Rfl: 0 ondansetron (ZOFRAN) 8 MG tablet, Take by  mouth every 8 (eight) hours as needed for nausea or vomiting., Disp: , Rfl:  senna (SENOKOT) 8.6 MG tablet, Take 1 tablet by mouth daily. , Disp: , Rfl:   tiZANidine (ZANAFLEX) 4 MG tablet, Take 4 mg by mouth at bedtime., Disp: , Rfl:  traZODone (DESYREL) 100 MG tablet, Take 100-200 mg by mouth at bedtime., Disp: , Rfl:  vitamin B-12 (CYANOCOBALAMIN) 500 MCG tablet, Take 500 mcg by mouth daily. Two tablets at night, Disp: , Rfl:   No current facility-administered medications for this visit.     Blood pressure 126/75, pulse 98, height 5\' 1"  (1.549 m), weight 170 lb (77.1 kg), last menstrual period 12/25/2003.  Physical Exam: Area of surgery is healing very wll  No evidence of infection minimal bruising  Diagnostic Tests:   Pathology: VINIII as expected with +surgical margin also expected, which does not change management, surveillance every 6 months for a couple of years then yearly  Impression: S/p partial vulvectomy for the treatment of VINIII  Plan:   Follow up: 6  months  Florian Buff, MD

## 2019-09-02 ENCOUNTER — Telehealth: Payer: Self-pay | Admitting: *Deleted

## 2019-09-02 NOTE — Telephone Encounter (Signed)
Patient requesting refill on her pain meds. She is still having a lot of pain.

## 2019-09-03 ENCOUNTER — Other Ambulatory Visit: Payer: Self-pay | Admitting: Obstetrics & Gynecology

## 2019-09-03 MED ORDER — HYDROCODONE-ACETAMINOPHEN 5-325 MG PO TABS: 1.0000 | ORAL_TABLET | Freq: Four times a day (QID) | ORAL | 0 refills | Status: DC | PRN

## 2019-09-03 NOTE — Telephone Encounter (Signed)
refilled 

## 2019-09-09 ENCOUNTER — Telehealth: Payer: Self-pay | Admitting: *Deleted

## 2019-09-09 NOTE — Telephone Encounter (Signed)
Wants a refill on pain medication.

## 2019-09-10 ENCOUNTER — Telehealth: Payer: Self-pay | Admitting: *Deleted

## 2019-09-10 NOTE — Telephone Encounter (Signed)
Returning Laura Jones's call.  

## 2019-09-10 NOTE — Telephone Encounter (Signed)
Pt aware I routed her message to Dr. Elonda Husky. [place]

## 2019-09-14 ENCOUNTER — Telehealth: Payer: Self-pay | Admitting: *Deleted

## 2019-09-14 NOTE — Telephone Encounter (Signed)
Left message letting pt know she shouldn't need any narcotic pain meds at this point per Dr. Elonda Husky. [place]

## 2019-09-14 NOTE — Telephone Encounter (Signed)
Requesting pain medication

## 2019-09-14 NOTE — Telephone Encounter (Signed)
Patient states she is having itching where her stitches are located and is having some pain.  Informed patient per Dr Elonda Husky, she should not need any narcotics at this time and can try using NSAIDS if she can.  Pt verbalized understanding stating she has used them but just wanted to check. Advised to call back if she has any further questions.

## 2019-09-14 NOTE — Telephone Encounter (Signed)
Pt should need any narcotic pain meds at this point

## 2019-09-17 ENCOUNTER — Ambulatory Visit: Payer: Self-pay | Admitting: Neurology

## 2019-10-29 ENCOUNTER — Other Ambulatory Visit: Payer: Medicare Other

## 2019-10-29 ENCOUNTER — Other Ambulatory Visit: Payer: Self-pay

## 2019-10-29 DIAGNOSIS — Z21 Asymptomatic human immunodeficiency virus [HIV] infection status: Secondary | ICD-10-CM

## 2019-10-30 LAB — T-HELPER CELL (CD4) - (RCID CLINIC ONLY)
CD4 % Helper T Cell: 30 % — ABNORMAL LOW (ref 33–65)
CD4 T Cell Abs: 547 /uL (ref 400–1790)

## 2019-11-03 LAB — HIV-1 RNA QUANT-NO REFLEX-BLD
HIV 1 RNA Quant: <20 copies/mL
HIV-1 RNA Quant, Log: <1.3 Log copies/mL

## 2019-11-12 ENCOUNTER — Encounter: Payer: Medicare Other | Admitting: Infectious Diseases

## 2019-11-12 NOTE — Progress Notes (Deleted)
Patient Name: Laura Jones  Date of Birth: [birth year] MRN: [id number]  PCP: Neale Burly, MD   Patient Active Problem List   Diagnosis Date Noted  . Hepatic cirrhosis ([place]) 05/27/2019  . Vulvar lesion 10/14/2018  . OA (osteoarthritis) of hip 06/18/2018  . Hypercholesteremia 12/08/2017  . Hepatitis C virus infection cured after antiviral drug therapy 07/30/2017  . Acid reflux 07/30/2017  . HIV (human immunodeficiency virus infection) ([place]) 07/29/2017   SUBJECTIVE:  Brief Narrative:   Laura Jones is a 48 y.o. female with HIV disease, dx "a long time ago." Relocated from [place] in 2018 where she was in care with [facility].  History of Hep C, cured (s/p treatment x 2, HCV Quant < 15, 07/29/2017).   +Cirrhosis per notes from previous liver biopsies  S/P hysterectomy Hep B sAg (-), Hep B sAb (-) following several attempts to vaccinate.  History of OIs: none known.  HIV Risk: heterosexual contact  Previous Regimens:   Genvoya >> suppressed   Biktarvy 09/2018 >> switched d/t DDI with medications  Genotype:   K103N - NNRTI resistance per chart records   Patient Active Problem List   Diagnosis Date Noted  . Hepatic cirrhosis ([place]) 05/27/2019  . Vulvar lesion 10/14/2018  . OA (osteoarthritis) of hip 06/18/2018  . Hypercholesteremia 12/08/2017  . Hepatitis C virus infection cured after antiviral drug therapy 07/30/2017  . Acid reflux 07/30/2017  . HIV (human immunodeficiency virus infection) ([place]) 07/29/2017    No chief complaint on file.    HPI/ROS:  She is doing well on her Biktarvy and reports no trouble with side effects or concerns with accessing her medications. She reports excellent adherence and continues to take this every day without any lapses.  Interim health history noted for fall and fracture of the left ankle requiring internal fixation with hardware. She states that she recovered nicely from this and has had no trouble post op. She does  not that she has some "swaying" sometimes with her walking and notices she wavers to the right side at times. She denies any associated dizziness or weakness.   Denies any sexual activity since last office visit.   Review of Systems  Constitutional: Negative for chills, fever, malaise/fatigue and weight loss.  HENT: Negative for sore throat.        No dental problems  Respiratory: Negative for cough and sputum production.   Cardiovascular: Negative for chest pain and leg swelling.  Gastrointestinal: Negative for abdominal pain, diarrhea and vomiting.  Genitourinary: Negative for dysuria and flank pain.  Musculoskeletal: Positive for falls. Negative for joint pain, myalgias and neck pain.  Skin: Negative for rash.  Neurological: Negative for dizziness, tingling and headaches.       Off balance  Psychiatric/Behavioral: Negative for depression and substance abuse. The patient is not nervous/anxious and does not have insomnia.     Past Medical History:  Diagnosis Date  . Anemia    hx of   . Anxiety   . Arthritis    left knee  . Avascular necrosis of bone of hip, left ([place])   . Bulging lumbar disc   . Depression   . Dyspnea    with excertion  . GERD (gastroesophageal reflux disease)   . Hepatitis    C treated 2 years ago went undetected  . History of kidney stones   . HIV (human immunodeficiency virus infection) ([place]) 07/29/2017  . HIV infection ([place])   . Insomnia   .  Migraines   . Nerve pain   . Perimenopausal   . Porphyria (HCC)   . PTSD (post-traumatic stress disorder)   . Spinal stenosis of lumbosacral region   . Tendonitis    right arm   . Trauma     Social History   Tobacco Use  . Smoking status: Never Smoker  . Smokeless tobacco: Never Used  Substance Use Topics  . Alcohol use: No  . Drug use: No    Family History  Problem Relation Age of Onset  . COPD Mother   . Osteoporosis Mother   . Hypertension Father   . Healthy Sister   . Heart disease Maternal  Grandmother   . Heart disease Maternal Grandfather     Allergies  Allergen Reactions  . Levaquin [Levofloxacin] Rash    Difficulty breathing, mouth swelling/rash  . Fish Oil Nausea And Vomiting  . Compazine [Prochlorperazine Edisylate]     Muscle twitching  . Droperidol     uncontrolled muscle movements   . Imitrex [Sumatriptan]     Difficulty breathing, chest pain; All "triptans"  . Reglan [Metoclopramide]     Uncontrolled twitching  . Sulfa Antibiotics Hives    Hives   . Thorazine [Chlorpromazine]     Uncontrolled muscle twitching    Objective:  There were no vitals filed for this visit. There is no height or weight on file to calculate BMI.  Physical Exam Constitutional:      Appearance: She is well-developed.     Comments: Seated comfortably in chair.   HENT:     Mouth/Throat:     Mouth: No oral lesions.     Dentition: Normal dentition. No dental abscesses.     Pharynx: No oropharyngeal exudate.  Cardiovascular:     Rate and Rhythm: Normal rate and regular rhythm.     Heart sounds: Normal heart sounds.  Pulmonary:     Effort: Pulmonary effort is normal.     Breath sounds: Normal breath sounds.  Abdominal:     General: There is no distension.     Palpations: Abdomen is soft.     Tenderness: There is no abdominal tenderness.  Musculoskeletal:     Comments: Normal gait observed with walking down hall.  Well healed surgical incision to lateral/medial malleolus of the left ankle.   Lymphadenopathy:     Cervical: No cervical adenopathy.  Skin:    General: Skin is warm and dry.     Findings: No rash.     Comments: Multiple white macules over forearms b/l  Neurological:     Mental Status: She is alert and oriented to person, place, and time.  Psychiatric:        Judgment: Judgment normal.     Comments: In good spirits today and engaged in care discussion     Lab Results Lab Results  Component Value Date   WBC 3.7 (L) 08/17/2019   HGB 12.0 08/17/2019    HCT 38.0 08/17/2019   MCV 96.2 08/17/2019   PLT 212 08/17/2019    Lab Results  Component Value Date   CREATININE 0.93 08/17/2019   BUN 10 08/17/2019   NA 140 08/17/2019   K 3.6 08/17/2019   CL 107 08/17/2019   CO2 23 08/17/2019    Lab Results  Component Value Date   ALT 13 08/17/2019   AST 22 08/17/2019   ALKPHOS 108 08/17/2019   BILITOT 0.3 08/17/2019    Lab Results  Component Value Date   CHOL 173  05/11/2019   HDL 52 05/11/2019   LDLCALC 91 05/11/2019   TRIG 198 (H) 05/11/2019   CHOLHDL 3.3 05/11/2019   HIV 1 RNA Quant (copies/mL)  Date Value  10/29/2019 <20 NOT DETECTED  05/11/2019 <20 NOT DETECTED  10/13/2018 55 (H)   CD4 T Cell Abs (/uL)  Date Value  10/29/2019 547  05/11/2019 470  10/13/2018 350 (L)   Lab Results  Component Value Date   HAV REACTIVE (A) 07/29/2017   Lab Results  Component Value Date   HEPBSAG NON-REACTIVE 07/29/2017   HEPBSAB NON-REACTIVE 07/29/2017   Lab Results  Component Value Date   QUANTGOLD NEGATIVE 07/29/2017    ASSESSMENT & PLAN:   Problem List Items Addressed This Visit    None      Rexene Alberts, MSN, NP-C [facility]  [email or web address] Pager: [phone] Office: [phone] [facility] Main Line: [phone]

## 2019-11-16 DIAGNOSIS — K589 Irritable bowel syndrome without diarrhea: Secondary | ICD-10-CM | POA: Diagnosis not present

## 2019-11-16 DIAGNOSIS — G43909 Migraine, unspecified, not intractable, without status migrainosus: Secondary | ICD-10-CM | POA: Diagnosis not present

## 2019-11-16 DIAGNOSIS — K2100 Gastro-esophageal reflux disease with esophagitis, without bleeding: Secondary | ICD-10-CM | POA: Diagnosis not present

## 2019-11-16 DIAGNOSIS — E782 Mixed hyperlipidemia: Secondary | ICD-10-CM | POA: Diagnosis not present

## 2019-11-16 DIAGNOSIS — Z0000 Encounter for general adult medical examination without abnormal findings: Secondary | ICD-10-CM | POA: Diagnosis not present

## 2019-11-16 DIAGNOSIS — K219 Gastro-esophageal reflux disease without esophagitis: Secondary | ICD-10-CM | POA: Diagnosis not present

## 2019-11-30 ENCOUNTER — Ambulatory Visit (INDEPENDENT_AMBULATORY_CARE_PROVIDER_SITE_OTHER): Payer: Medicare Other | Admitting: Infectious Diseases

## 2019-11-30 ENCOUNTER — Other Ambulatory Visit: Payer: Self-pay

## 2019-11-30 ENCOUNTER — Encounter: Payer: Self-pay | Admitting: Infectious Diseases

## 2019-11-30 VITALS — BP 117/78 | HR 88 | Temp 98.0°F | Ht 61.0 in | Wt 174.0 lb

## 2019-11-30 DIAGNOSIS — Z21 Asymptomatic human immunodeficiency virus [HIV] infection status: Secondary | ICD-10-CM | POA: Diagnosis not present

## 2019-11-30 DIAGNOSIS — Z8619 Personal history of other infectious and parasitic diseases: Secondary | ICD-10-CM

## 2019-11-30 DIAGNOSIS — K7460 Unspecified cirrhosis of liver: Secondary | ICD-10-CM

## 2019-11-30 DIAGNOSIS — Z113 Encounter for screening for infections with a predominantly sexual mode of transmission: Secondary | ICD-10-CM | POA: Diagnosis not present

## 2019-11-30 DIAGNOSIS — D071 Carcinoma in situ of vulva: Secondary | ICD-10-CM

## 2019-11-30 DIAGNOSIS — Z23 Encounter for immunization: Secondary | ICD-10-CM | POA: Diagnosis not present

## 2019-11-30 MED ORDER — BIKTARVY 50-200-25 MG PO TABS: 1.0000 | ORAL_TABLET | Freq: Every day | ORAL | 6 refills | Status: DC

## 2019-11-30 NOTE — Patient Instructions (Signed)
Nice to see you today!  Vaccines we gave you - flu and pneumonia update  Please continue taking your Biktarvy every day as you are. There is a long acting injectable medication option to treat HIV coming out in the next coming months - it is a shot once a month and eventually every 2 months. We can discuss at your next visit if you are interested.   Please schedule a liver ultrasound with a lab visit in 6 months and then a MyChart Video visit with me 2 weeks later to check in and review results.   Happy Holidays!

## 2019-11-30 NOTE — Progress Notes (Signed)
Patient Name: Laura Jones M Val  Date of Birth: Oct 23, 1971 MRN: [id number]  PCP: Toma DeitersHasanaj, Xaje A, MD    Patient Active Problem List   Diagnosis Date Noted  . Hepatic cirrhosis (HCC) 05/27/2019  . VIN III (vulvar intraepithelial neoplasia III) 10/14/2018  . OA (osteoarthritis) of hip 06/18/2018  . Hypercholesteremia 12/08/2017  . Hepatitis C virus infection cured after antiviral drug therapy 07/30/2017  . Acid reflux 07/30/2017  . HIV (human immunodeficiency virus infection) (H[facility]) 07/29/2017   SUBJECTIVE:  Brief Narrative:   Laura Jones is a 48 y.o. female with HIV disease, dx "a long time ago." Relocated from [place] in 2018 where [facility].  History of Hep C, cured (s/p treatment x 2, HCV Quant < 15, 07/29/2017).   +Cirrhosis per notes from previous liver biopsies  S/P hysterectomy Hep B sAg (-), Hep B sAb (-) following several attempts to vaccinate.  History of OIs: none known.  HIV Risk: heterosexual contact  Previous Regimens:   Genvoya >> suppressed   Biktarvy 09/2018 >> switched d/t DDI with medications  Genotype:   K103N - NNRTI resistance per chart records   Patient Active Problem List   Diagnosis Date Noted  . Hepatic cirrhosis (H[facility]) 05/27/2019  . VIN III (vulvar intraepithelial neoplasia III) 10/14/2018  . OA (osteoarthritis) of hip 06/18/2018  . Hypercholesteremia 12/08/2017  . Hepatitis C virus infection cured after antiviral drug therapy 07/30/2017  . Acid reflux 07/30/2017  . HIV (human immunodeficiency virus infection) (H[facility]) 07/29/2017    Chief Complaint  Patient presents with  . Follow-up     HPI/ROS:  Laura Jones is doing well on her Biktarvy. She takes this every day without any estimated missed doses.   Interim health history noted for partial vulvectomy with complex repair for leukoplakia dysplastic lesion of the vulva that was concerning for VINIII on 08/19/19 with Dr. Despina HiddenEure --> Q4970m follow up for now  scheduled. She reports no difficulty or trouble following her surgery.   [facility] F[facility] / F3 on elastography. Denies any skin color changes, abdominal pain, bruising/bleeding or changes to bowel movements.    Her balance and strength have improved since last OV following her hip replacements. She has not had any falls.   Review of Systems  Constitutional: Negative for chills, fever, malaise/fatigue and weight loss.  HENT: Negative for sore throat.        No dental problems  Respiratory: Negative for cough and sputum production.   Cardiovascular: Negative for chest pain and leg swelling.  Gastrointestinal: Negative for abdominal pain, diarrhea and vomiting.  Genitourinary: Negative for dysuria and flank pain.  Musculoskeletal: Negative for joint pain, myalgias and neck pain.  Skin: Negative for rash.  Neurological: Negative for dizziness, tingling and headaches.  Psychiatric/Behavioral: Negative for depression and substance abuse. The patient is not nervous/anxious and does not have insomnia.     Past Medical History:  Diagnosis Date  . Anemia    hx of   . Anxiety   . Arthritis    left knee  . Avascular necrosis of bone of hip, left (HCC)   . Bulging lumbar disc   . Depression   . Dyspnea    with excertion  . GERD (gastroesophageal reflux disease)   . Hepatitis    C treated 2 years ago went undetected  . History of kidney stones   . HIV (human immunodeficiency virus infection) (H[facility]) 07/29/2017  . HIV  infection ([place])   . Insomnia   . Migraines   . Nerve pain   . Perimenopausal   . Porphyria ([place])   . PTSD (post-traumatic stress disorder)   . Spinal stenosis of lumbosacral region   . Tendonitis    right arm   . Trauma     Social History   Tobacco Use  . Smoking status: Never Smoker  . Smokeless tobacco: Never Used  Substance Use Topics  . Alcohol use: No  . Drug use: No     Allergies  Allergen Reactions  . Levaquin  [Levofloxacin] Rash    Difficulty breathing, mouth swelling/rash  . Fish Oil Nausea And Vomiting  . Compazine [Prochlorperazine Edisylate]     Muscle twitching  . Droperidol     uncontrolled muscle movements   . Imitrex [Sumatriptan]     Difficulty breathing, chest pain; All "triptans"  . Reglan [Metoclopramide]     Uncontrolled twitching  . Sulfa Antibiotics Hives    Hives   . Thorazine [Chlorpromazine]     Uncontrolled muscle twitching    Objective:  Vitals:   11/30/19 0937  BP: 117/78  Pulse: 88  Temp: 98 F (36.7 C)  Weight: 174 lb (78.9 kg)  Height: 5\' 1"  (1.549 m)   Body mass index is 32.88 kg/m.  Physical Exam Constitutional:      Appearance: She is well-developed.     Comments: Seated comfortably in chair.   HENT:     Mouth/Throat:     Mouth: No oral lesions.     Dentition: Normal dentition. No dental abscesses.     Pharynx: No oropharyngeal exudate.  Cardiovascular:     Rate and Rhythm: Normal rate and regular rhythm.     Heart sounds: Normal heart sounds.  Pulmonary:     Effort: Pulmonary effort is normal.     Breath sounds: Normal breath sounds.  Abdominal:     General: There is no distension.     Palpations: Abdomen is soft.     Tenderness: There is no abdominal tenderness.  Lymphadenopathy:     Cervical: No cervical adenopathy.  Skin:    General: Skin is warm and dry.     Findings: No rash.  Neurological:     Mental Status: She is alert and oriented to person, place, and time.  Psychiatric:        Judgment: Judgment normal.     Comments: In good spirits today and engaged in care discussion     Lab Results Lab Results  Component Value Date   WBC 3.7 (L) 08/17/2019   HGB 12.0 08/17/2019   HCT 38.0 08/17/2019   MCV 96.2 08/17/2019   PLT 212 08/17/2019    Lab Results  Component Value Date   CREATININE 0.93 08/17/2019   BUN 10 08/17/2019   NA 140 08/17/2019   K 3.6 08/17/2019   CL 107 08/17/2019   CO2 23 08/17/2019    Lab  Results  Component Value Date   ALT 13 08/17/2019   AST 22 08/17/2019   ALKPHOS 108 08/17/2019   BILITOT 0.3 08/17/2019    Lab Results  Component Value Date   CHOL 173 05/11/2019   HDL 52 05/11/2019   LDLCALC 91 05/11/2019   TRIG 198 (H) 05/11/2019   CHOLHDL 3.3 05/11/2019   HIV 1 RNA Quant (copies/mL)  Date Value  10/29/2019 <20 NOT DETECTED  05/11/2019 <20 NOT DETECTED  10/13/2018 55 (H)   CD4 T Cell Abs (/uL)  Date  Value  10/29/2019 547  05/11/2019 470  10/13/2018 350 (L)   Lab Results  Component Value Date   HAV REACTIVE (A) 07/29/2017   Lab Results  Component Value Date   HEPBSAG NON-REACTIVE 07/29/2017   HEPBSAB NON-REACTIVE 07/29/2017   Lab Results  Component Value Date   QUANTGOLD NEGATIVE 07/29/2017    ASSESSMENT & PLAN:   Problem List Items Addressed This Visit      Unprioritized   VIN III (vulvar intraepithelial neoplasia III)    S/P excision in August 2020. Following up with Dr. Despina Hidden with GYN       HIV (human immunodeficiency virus infection) (HCC) (Chronic)    Her HIV is under excellent long term control. She is interested in LA-injectable therapy once it is FDA approved for commercial use. Will for now continue Biktarvy daily.  Follow up Q25m given polypharmacy and co morbidities with labs and virtual visit.   Vaccines updated today including flu shot      Relevant Medications   bictegravir-emtricitabine-tenofovir AF (BIKTARVY) 50-200-25 MG TABS tablet   Other Relevant Orders   6 month CD4   6 month VL   6 month CBC   6 month CMP   Urinalysis   Hepatitis C virus infection cured after antiviral drug therapy   Relevant Orders   Liver U/S, HCC monitoring   Hepatic cirrhosis (HCC)    Viremic liver biopsy prior to Hep C therapy +cirrhosis. Elastography indicates F2 with some F3. No tumor effect noted. Will continue screening with q28m ultrasounds and AFP.       Relevant Orders   AFP tumor marker    Other Visit Diagnoses    Routine  screening for STI (sexually transmitted infection)    -  Primary   Relevant Orders   6 month RPR   Urine GC / C   Need for 23-polyvalent pneumococcal polysaccharide vaccine       Relevant Orders   Pneumococcal polysaccharide vaccine 23-valent greater than or equal to 2yo subcutaneous/IM (Completed)   Need for immunization against influenza       Relevant Orders   Flu Vaccine QUAD 36+ mos IM (Completed)      Rexene Alberts, MSN, NP-C [facility]  [email or web address] Pager: [phone] Office: [phone] [facility] Main Line: [phone]

## 2019-12-01 NOTE — Assessment & Plan Note (Addendum)
Her HIV is under excellent long term control. She is interested in LA-injectable therapy once it is FDA approved for commercial use. Will for now continue Biktarvy daily.  Follow up Q73m given polypharmacy and co morbidities with labs and virtual visit.   Vaccines updated today including flu shot

## 2019-12-01 NOTE — Assessment & Plan Note (Signed)
Viremic liver biopsy prior to Hep C therapy +cirrhosis. Elastography indicates F2 with some F3. No tumor effect noted. Will continue screening with q35m ultrasounds and AFP.

## 2019-12-01 NOTE — Assessment & Plan Note (Signed)
S/P excision in August 2020. Following up with Dr. Elonda Husky with GYN

## 2019-12-08 ENCOUNTER — Ambulatory Visit (HOSPITAL_COMMUNITY): Admission: RE | Admit: 2019-12-08 | Payer: Medicare Other | Source: Ambulatory Visit

## 2019-12-11 DIAGNOSIS — Z1211 Encounter for screening for malignant neoplasm of colon: Secondary | ICD-10-CM | POA: Diagnosis not present

## 2019-12-14 DIAGNOSIS — Z01818 Encounter for other preprocedural examination: Secondary | ICD-10-CM | POA: Diagnosis not present

## 2019-12-16 DIAGNOSIS — Z1211 Encounter for screening for malignant neoplasm of colon: Secondary | ICD-10-CM | POA: Diagnosis not present

## 2019-12-16 DIAGNOSIS — G43909 Migraine, unspecified, not intractable, without status migrainosus: Secondary | ICD-10-CM | POA: Diagnosis not present

## 2019-12-16 DIAGNOSIS — Z79899 Other long term (current) drug therapy: Secondary | ICD-10-CM | POA: Diagnosis not present

## 2019-12-16 DIAGNOSIS — K219 Gastro-esophageal reflux disease without esophagitis: Secondary | ICD-10-CM | POA: Diagnosis not present

## 2019-12-16 DIAGNOSIS — K581 Irritable bowel syndrome with constipation: Secondary | ICD-10-CM | POA: Diagnosis not present

## 2019-12-16 DIAGNOSIS — Z791 Long term (current) use of non-steroidal anti-inflammatories (NSAID): Secondary | ICD-10-CM | POA: Diagnosis not present

## 2019-12-16 DIAGNOSIS — E7800 Pure hypercholesterolemia, unspecified: Secondary | ICD-10-CM | POA: Diagnosis not present

## 2019-12-19 ENCOUNTER — Encounter (HOSPITAL_COMMUNITY): Payer: Self-pay | Admitting: *Deleted

## 2019-12-19 ENCOUNTER — Other Ambulatory Visit: Payer: Self-pay

## 2019-12-19 ENCOUNTER — Emergency Department (HOSPITAL_COMMUNITY)
Admission: EM | Admit: 2019-12-19 | Discharge: 2019-12-19 | Disposition: A | Discharge status: Home / Self Care | Payer: Medicare Other | Attending: Emergency Medicine | Admitting: Emergency Medicine

## 2019-12-19 DIAGNOSIS — Z21 Asymptomatic human immunodeficiency virus [HIV] infection status: Secondary | ICD-10-CM | POA: Insufficient documentation

## 2019-12-19 DIAGNOSIS — R42 Dizziness and giddiness: Secondary | ICD-10-CM | POA: Diagnosis not present

## 2019-12-19 DIAGNOSIS — I959 Hypotension, unspecified: Secondary | ICD-10-CM | POA: Diagnosis not present

## 2019-12-19 DIAGNOSIS — R001 Bradycardia, unspecified: Secondary | ICD-10-CM | POA: Diagnosis not present

## 2019-12-19 DIAGNOSIS — Z743 Need for continuous supervision: Secondary | ICD-10-CM | POA: Diagnosis not present

## 2019-12-19 DIAGNOSIS — R0689 Other abnormalities of breathing: Secondary | ICD-10-CM | POA: Diagnosis not present

## 2019-12-19 DIAGNOSIS — Z79899 Other long term (current) drug therapy: Secondary | ICD-10-CM | POA: Insufficient documentation

## 2019-12-19 LAB — COMPREHENSIVE METABOLIC PANEL
ALT: 10 U/L (ref 0–44)
AST: 13 U/L — ABNORMAL LOW (ref 15–41)
Albumin: 3.6 g/dL (ref 3.5–5.0)
Alkaline Phosphatase: 95 U/L (ref 38–126)
Anion gap: 9 (ref 5–15)
BUN: 9 mg/dL (ref 6–20)
CO2: 21 mmol/L — ABNORMAL LOW (ref 22–32)
Calcium: 8.3 mg/dL — ABNORMAL LOW (ref 8.9–10.3)
Chloride: 106 mmol/L (ref 98–111)
Creatinine, Ser: 1.03 mg/dL — ABNORMAL HIGH (ref 0.44–1.00)
GFR calc Af Amer: >60 mL/min (ref >60)
GFR calc non Af Amer: >60 mL/min (ref >60)
Glucose, Bld: 109 mg/dL — ABNORMAL HIGH (ref 70–99)
Potassium: 3.6 mmol/L (ref 3.5–5.1)
Sodium: 136 mmol/L (ref 135–145)
Total Bilirubin: 0.3 mg/dL (ref 0.3–1.2)
Total Protein: 7.3 g/dL (ref 6.5–8.1)

## 2019-12-19 LAB — CBC WITH DIFFERENTIAL/PLATELET
Abs Immature Granulocytes: 0.01 10*3/uL (ref 0.00–0.07)
Basophils Absolute: 0 10*3/uL (ref 0.0–0.1)
Basophils Relative: 1 %
Eosinophils Absolute: 0.1 10*3/uL (ref 0.0–0.5)
Eosinophils Relative: 2 %
HCT: 36.2 % (ref 36.0–46.0)
Hemoglobin: 11.7 g/dL — ABNORMAL LOW (ref 12.0–15.0)
Immature Granulocytes: 0 %
Lymphocytes Relative: 26 %
Lymphs Abs: 1.5 10*3/uL (ref 0.7–4.0)
MCH: 30.7 pg (ref 26.0–34.0)
MCHC: 32.3 g/dL (ref 30.0–36.0)
MCV: 95 fL (ref 80.0–100.0)
Monocytes Absolute: 0.4 10*3/uL (ref 0.1–1.0)
Monocytes Relative: 8 %
Neutro Abs: 3.6 10*3/uL (ref 1.7–7.7)
Neutrophils Relative %: 63 %
Platelets: 189 10*3/uL (ref 150–400)
RBC: 3.81 MIL/uL — ABNORMAL LOW (ref 3.87–5.11)
RDW: 13.8 % (ref 11.5–15.5)
WBC: 5.7 10*3/uL (ref 4.0–10.5)
nRBC: 0 % (ref 0.0–0.2)

## 2019-12-19 LAB — URINALYSIS, ROUTINE W REFLEX MICROSCOPIC
Bilirubin Urine: NEGATIVE
Glucose, UA: NEGATIVE mg/dL
Hgb urine dipstick: NEGATIVE
Ketones, ur: NEGATIVE mg/dL
Leukocytes,Ua: NEGATIVE
Nitrite: NEGATIVE
Protein, ur: NEGATIVE mg/dL
Specific Gravity, Urine: 1.009 (ref 1.005–1.030)
pH: 6 (ref 5.0–8.0)

## 2019-12-19 MED ORDER — SODIUM CHLORIDE 0.9 % IV BOLUS
1000.0000 mL | Freq: Once | INTRAVENOUS | Status: AC
  Administered 2019-12-19: 1000 mL via INTRAVENOUS

## 2019-12-19 NOTE — Discharge Instructions (Signed)
Your testing today has been unremarkable and reassuring!,  Please seek medical exam for severe or worsening symptoms, drink plenty of fluids, please do not use your headache medication anymore as this may be causing your low blood pressure.  Talk to your doctor within 72 hours for a recheck, emergency department for severe or worsening symptoms.

## 2019-12-19 NOTE — ED Triage Notes (Signed)
Patient presents to the ED with "weakiness and dizziness" after taking her newly prescribed migraine medication, Ubrelvy.  EMS reports a manual BP of 70/46. Triage BP, 81/40 on monitor.

## 2019-12-19 NOTE — ED Provider Notes (Signed)
[facility] EMERGENCY DEPARTMENT Provider Note   CSN: [id number] Arrival date & time: 12/19/19  1330     History Chief Complaint  Patient presents with  . Hypotension    Laura Gwynneth MacleodM Jones is a 48 y.o. female.  HPI   This patient is a 48 year old female, she has a known history of HIV, her last CD4 count was over 500.  She has a history of hepatitis C, history of depression and anxiety and a history of migraines for which she takes Vanuatubrelvy.  She is on HIV medications.  She was recently seen at her doctor's office and had some isolated hypertension however this was elected to be treated supportively.  She has not been taking any blood pressure medications but over the last month since she saw her doctor in the office and after she was started on Vanuatubrelvy she has had some fluctuating blood pressures which seem to go both up and down.  She has had some measurements down as low as 70 and some measurements as high as 150.  She has noted several episodes where she has become very lightheaded and even fallen when she gets lightheaded because of her hypotension.  When the paramedics were called today after 1 of these episodes she was noted to be approximately 70 systolic, pale but no seizures.  She denies chest pain, shortness of breath, cough, fever, visual changes, headache at this time, abdominal pain, nausea vomiting or diarrhea, swelling, rashes or any other complaints.  She is not having any numbness, she is not focally weak.  She states that she is feeling much better at this time.  She was given some IV fluids by paramedics in route to the hospital.  Her last dose of her headache migraine medication Bernita RaisinUbrelvy was on Christmas Eve, 36 hours ago.  Past Medical History:  Diagnosis Date  . Anemia    hx of   . Anxiety   . Arthritis    left knee  . Avascular necrosis of bone of hip, left (HCC)   . Bulging lumbar disc   . Depression   . Dyspnea    with excertion  . GERD (gastroesophageal  reflux disease)   . Hepatitis    C treated 2 years ago went undetected  . History of kidney stones   . HIV (human immunodeficiency virus infection) (HCC) 07/29/2017  . HIV infection (HCC)   . Insomnia   . Migraines   . Nerve pain   . Perimenopausal   . Porphyria (HCC)   . PTSD (post-traumatic stress disorder)   . Spinal stenosis of lumbosacral region   . Tendonitis    right arm   . Trauma     Patient Active Problem List   Diagnosis Date Noted  . Hepatic cirrhosis (HCC) 05/27/2019  . VIN III (vulvar intraepithelial neoplasia III) 10/14/2018  . OA (osteoarthritis) of hip 06/18/2018  . Hypercholesteremia 12/08/2017  . Hepatitis C virus infection cured after antiviral drug therapy 07/30/2017  . Acid reflux 07/30/2017  . HIV (human immunodeficiency virus infection) (HCC) 07/29/2017    Past Surgical History:  Procedure Laterality Date  . ABDOMINAL HYSTERECTOMY    . ANKLE SURGERY Left 01/26/2019  . CESAREAN SECTION    . ESOPHAGOGASTRODUODENOSCOPY     x2  . INCONTINENCE SURGERY    . JOINT REPLACEMENT     left hip replacement Dr. Lequita HaltAluisio 06-18-18  . TOTAL HIP ARTHROPLASTY Right 02/2013  . TOTAL HIP ARTHROPLASTY Left 06/18/2018   Procedure: LEFT TOTAL HIP  ARTHROPLASTY ANTERIOR APPROACH;  Surgeon: Ollen Gross, MD;  Location: [facility];  Service: Orthopedics;  Laterality: Left;  . TUBAL LIGATION    . URETHRAL SLING    . VULVECTOMY PARTIAL N/A 08/19/2019   Procedure: VULVECTOMY PARTIAL;  Surgeon: Lazaro Arms, MD;  Location: [facility];  Service: Gynecology;  Laterality: N/A;     OB History    Gravida  2   Para  1   Term  1   Preterm      AB  1   Living  1     SAB  1   TAB      Ectopic      Multiple      Live Births  1           Family History  Problem Relation Age of Onset  . COPD Mother   . Osteoporosis Mother   . Hypertension Father   . Healthy Sister   . Heart disease Maternal Grandmother   . Heart disease Maternal Grandfather     Social History     Tobacco Use  . Smoking status: Never Smoker  . Smokeless tobacco: Never Used  Substance Use Topics  . Alcohol use: No  . Drug use: No    Home Medications Prior to Admission medications   Medication Sig Start Date End Date Taking? Authorizing Provider  atorvastatin (LIPITOR) 10 MG tablet Take 10 mg by mouth at bedtime. 04/27/18   [provider]  bictegravir-emtricitabine-tenofovir AF (BIKTARVY) 50-200-25 MG TABS tablet Take 1 tablet by mouth daily. 11/30/19   Blanchard Kelch, NP  buPROPion (ZYBAN) 150 MG 12 hr tablet Take 150 mg by mouth 2 (two) times daily.    [provider]  busPIRone (BUSPAR) 15 MG tablet Take 15 mg by mouth 2 (two) times daily. 04/08/18   [provider]  Cholecalciferol (VITAMIN D) 50 MCG (2000 UT) tablet Take 4,000 Units by mouth daily.     [provider]  citalopram (CELEXA) 40 MG tablet Take 40 mg by mouth daily.    [provider]  clobetasol ointment (TEMOVATE) 0.05 % Apply 1 application topically 2 (two) times daily. Patient taking differently: Apply 1 application topically 2 (two) times daily as needed (itching).  10/13/18   Blanchard Kelch, NP  dexlansoprazole (DEXILANT) 60 MG capsule Take 60 mg by mouth daily.    [provider]  dicyclomine (BENTYL) 10 MG capsule Take 10 mg by mouth 2 (two) times daily.  05/13/19   [provider]  docusate sodium (STOOL SOFTENER) 100 MG capsule Take 100 mg by mouth daily.     [provider]  HYDROcodone-acetaminophen (NORCO/VICODIN) 5-325 MG tablet Take 1 tablet by mouth every 6 (six) hours as needed. Patient not taking: Reported on 11/30/2019 09/03/19   Lazaro Arms, MD  ketorolac (TORADOL) 10 MG tablet TAKE 1 TABLET (10 MG TOTAL) BY MOUTH EVERY 8 (EIGHT) HOURS AS NEEDED. Patient not taking: Reported on 11/30/2019 08/25/19   Lazaro Arms, MD  loperamide (IMODIUM A-D) 2 MG tablet Take 2 mg by mouth as needed for diarrhea or loose stools.     [provider]  ondansetron (ZOFRAN ODT) 8 MG disintegrating tablet Take 1 tablet (8 mg total) by mouth every 8 (eight) hours as needed for nausea or vomiting. 08/19/19   Lazaro Arms, MD  ondansetron (ZOFRAN) 8 MG tablet Take by mouth every 8 (eight) hours as needed for nausea or vomiting.  [provider]  senna (SENOKOT) 8.6 MG tablet Take 1 tablet by mouth daily.  04/24/19   [provider]  tiZANidine (ZANAFLEX) 4 MG tablet Take 4 mg by mouth at bedtime.    [provider]  traZODone (DESYREL) 100 MG tablet Take 100-200 mg by mouth at bedtime.    [provider]  vitamin B-12 (CYANOCOBALAMIN) 500 MCG tablet Take 500 mcg by mouth daily. Two tablets at night    [provider]    Allergies    Levaquin [levofloxacin], Fish oil, Compazine [prochlorperazine edisylate], Droperidol, Imitrex [sumatriptan], Reglan [metoclopramide], Sulfa antibiotics, and Thorazine [chlorpromazine]  Review of Systems   Review of Systems  All other systems reviewed and are negative.   Physical Exam Updated Vital Signs BP 134/89   Pulse (!) 58   Temp 98.7 F (37.1 C) (Oral)   Resp 12   Ht 1.549 m (5\' 1" )   Wt 78 kg   LMP 12/25/2003   SpO2 99%   BMI 32.49 kg/m   Physical Exam Vitals and nursing note reviewed.  Constitutional:      General: She is not in acute distress.    Appearance: She is well-developed. She is not ill-appearing or diaphoretic.  HENT:     Head: Normocephalic and atraumatic.     Mouth/Throat:     Mouth: Mucous membranes are dry.     Pharynx: No oropharyngeal exudate.     Comments: The mucous membranes appear somewhat dry Eyes:     General: No scleral icterus.       Right eye: No discharge.        Left eye: No discharge.     Conjunctiva/sclera: Conjunctivae normal.     Pupils: Pupils are equal, round, and reactive to light.  Neck:     Thyroid: No thyromegaly.     Vascular: No JVD.  Cardiovascular:     Rate and Rhythm:  Normal rate and regular rhythm.     Heart sounds: Normal heart sounds. No murmur. No friction rub. No gallop.   Pulmonary:     Effort: Pulmonary effort is normal. No respiratory distress.     Breath sounds: Normal breath sounds. No wheezing or rales.  Abdominal:     General: Bowel sounds are normal. There is no distension.     Palpations: Abdomen is soft. There is no mass.     Tenderness: There is no abdominal tenderness.  Musculoskeletal:        General: No tenderness. Normal range of motion.     Cervical back: Normal range of motion and neck supple.  Lymphadenopathy:     Cervical: No cervical adenopathy.  Skin:    General: Skin is warm and dry.     Findings: No erythema or rash.     Comments: The patient has no rashes to her skin  Neurological:     Mental Status: She is alert.     Coordination: Coordination normal.     Comments: The patient is able to move all 4 extremities, she has normal strength in grips, she has normal facial symmetry, sure speech is clear and memory is intact.  Psychiatric:        Behavior: Behavior normal.     ED Results / Procedures / Treatments   Labs (all labs ordered are listed, but only abnormal results are displayed) Labs Reviewed  URINALYSIS, ROUTINE W REFLEX MICROSCOPIC - Abnormal; Notable for the following components:      Result Value   Color,  Urine STRAW (*)    All other components within normal limits  CBC WITH DIFFERENTIAL/PLATELET - Abnormal; Notable for the following components:   RBC 3.81 (*)    Hemoglobin 11.7 (*)    All other components within normal limits  COMPREHENSIVE METABOLIC PANEL - Abnormal; Notable for the following components:   CO2 21 (*)    Glucose, Bld 109 (*)    Creatinine, Ser 1.03 (*)    Calcium 8.3 (*)    AST 13 (*)    All other components within normal limits    EKG EKG Interpretation  Date/Time:  Saturday December 19 2019 14:38:55 EST Ventricular Rate:  51 PR Interval:    QRS Duration: 328 QT  Interval:  548 QTC Calculation: 505 R Axis:   6 Text Interpretation: Sinus bradycardia Probable left ventricular hypertrophy Nonspecific T wave abnormality Since last tracing rate slower Confirmed by Eber Hong ([id number]) on 12/19/2019 2:54:34 PM   Radiology No results found.  Procedures Procedures (including critical care time)  Medications Ordered in ED Medications  sodium chloride 0.9 % bolus 1,000 mL (1,000 mLs Intravenous New Bag/Given 12/19/19 1438)    ED Course  I have reviewed the triage vital signs and the nursing notes.  Pertinent labs & imaging results that were available during my care of the patient were reviewed by me and considered in my medical decision making (see chart for details).    MDM Rules/Calculators/A&P                      Blood pressure repeated on my initial exam, she is 100 systolic over 60.  She has good pulses, she is not febrile, she is not tachycardic, she has no respiratory symptoms or any other symptoms at all for that matter.  She is currently not having a headache and does not feel lightheaded.  I will perform orthostatic vital signs, she likely needs some fluid and a check of some lab work.  It does not seem like the medication Bernita Raisin is causing the hypotension given the lack of coordination with timing of taking the medication and the symptoms however she has had some fluctuating blood pressures over the month which raise suspicion for other causes as well.  I have reviewed the medical record, her last CD4 count was confirmed to be 547.  Her last HIV quantification of RNA was undetectable also November 2020, and her last metabolic panel was unremarkable.  The patient's repeat blood pressure is now 134 systolic.  She is well-appearing, no distress, her metabolic panel is normal, her CBC shows no significant anemia and no leukocytosis, and her urinalysis shows no ketones, no abnormal specific gravity or signs of infection.  The EKG was overall  unremarkable and this patient is well-appearing, agreeable to discharge.  I would encourage her not to use the headache medication anymore as this may be in someway related to the hypotension however she will need to follow-up with her family doctor, she is agreeable to this plan.    Final Clinical Impression(s) / ED Diagnoses Final diagnoses:  Transient hypotension    Rx / DC Orders ED Discharge Orders    None       Eber Hong, MD 12/19/19 1538

## 2019-12-30 DIAGNOSIS — E782 Mixed hyperlipidemia: Secondary | ICD-10-CM | POA: Diagnosis not present

## 2019-12-30 DIAGNOSIS — K589 Irritable bowel syndrome without diarrhea: Secondary | ICD-10-CM | POA: Diagnosis not present

## 2019-12-30 DIAGNOSIS — Z0000 Encounter for general adult medical examination without abnormal findings: Secondary | ICD-10-CM | POA: Diagnosis not present

## 2019-12-30 DIAGNOSIS — I10 Essential (primary) hypertension: Secondary | ICD-10-CM | POA: Diagnosis not present

## 2019-12-30 DIAGNOSIS — K2100 Gastro-esophageal reflux disease with esophagitis, without bleeding: Secondary | ICD-10-CM | POA: Diagnosis not present

## 2019-12-30 DIAGNOSIS — G43909 Migraine, unspecified, not intractable, without status migrainosus: Secondary | ICD-10-CM | POA: Diagnosis not present

## 2019-12-31 ENCOUNTER — Ambulatory Visit (HOSPITAL_COMMUNITY): Payer: Medicare Other

## 2020-01-12 ENCOUNTER — Ambulatory Visit (HOSPITAL_COMMUNITY): Payer: Medicare Other

## 2020-01-15 ENCOUNTER — Ambulatory Visit (HOSPITAL_COMMUNITY): Payer: Medicare Other

## 2020-01-18 ENCOUNTER — Other Ambulatory Visit: Payer: Self-pay | Admitting: Obstetrics & Gynecology

## 2020-02-03 ENCOUNTER — Other Ambulatory Visit: Payer: Self-pay

## 2020-02-03 ENCOUNTER — Ambulatory Visit (HOSPITAL_COMMUNITY)
Admission: RE | Admit: 2020-02-03 | Discharge: 2020-02-03 | Disposition: A | Discharge status: Home / Self Care | Payer: Medicare Other | Source: Ambulatory Visit | Attending: Infectious Diseases | Admitting: Infectious Diseases

## 2020-02-03 DIAGNOSIS — Z8619 Personal history of other infectious and parasitic diseases: Secondary | ICD-10-CM | POA: Insufficient documentation

## 2020-03-09 DIAGNOSIS — K0859 Other unsatisfactory restoration of tooth: Secondary | ICD-10-CM | POA: Diagnosis not present

## 2020-03-09 DIAGNOSIS — I10 Essential (primary) hypertension: Secondary | ICD-10-CM | POA: Diagnosis not present

## 2020-04-07 ENCOUNTER — Other Ambulatory Visit: Payer: Self-pay | Admitting: Obstetrics & Gynecology

## 2020-05-09 ENCOUNTER — Other Ambulatory Visit: Payer: Medicare Other

## 2020-05-10 ENCOUNTER — Other Ambulatory Visit (HOSPITAL_COMMUNITY)
Admission: RE | Admit: 2020-05-10 | Discharge: 2020-05-10 | Disposition: A | Discharge status: Home / Self Care | Payer: Medicare Other | Source: Ambulatory Visit | Attending: Infectious Diseases | Admitting: Infectious Diseases

## 2020-05-10 ENCOUNTER — Encounter: Payer: Self-pay | Admitting: Infectious Diseases

## 2020-05-10 ENCOUNTER — Other Ambulatory Visit: Payer: Self-pay

## 2020-05-10 ENCOUNTER — Other Ambulatory Visit: Payer: Medicare Other

## 2020-05-10 DIAGNOSIS — Z21 Asymptomatic human immunodeficiency virus [HIV] infection status: Secondary | ICD-10-CM

## 2020-05-10 DIAGNOSIS — Z113 Encounter for screening for infections with a predominantly sexual mode of transmission: Secondary | ICD-10-CM | POA: Diagnosis present

## 2020-05-10 DIAGNOSIS — K7460 Unspecified cirrhosis of liver: Secondary | ICD-10-CM

## 2020-05-10 LAB — URINALYSIS
Bilirubin Urine: NEGATIVE
Glucose, UA: NEGATIVE
Hgb urine dipstick: NEGATIVE
Ketones, ur: NEGATIVE
Leukocytes,Ua: NEGATIVE
Nitrite: NEGATIVE
Protein, ur: NEGATIVE
Specific Gravity, Urine: 1.011 (ref 1.001–1.03)
pH: 6 (ref 5.0–8.0)

## 2020-05-11 LAB — URINE CYTOLOGY ANCILLARY ONLY
Chlamydia: NEGATIVE
Comment: NEGATIVE
Comment: NORMAL
Neisseria Gonorrhea: NEGATIVE

## 2020-05-11 LAB — T-HELPER CELL (CD4) - (RCID CLINIC ONLY)
CD4 % Helper T Cell: 34 % (ref 33–65)
CD4 T Cell Abs: 487 /uL (ref 400–1790)

## 2020-05-12 LAB — COMPREHENSIVE METABOLIC PANEL
AG Ratio: 1.3 (calc) (ref 1.0–2.5)
ALT: 13 U/L (ref 6–29)
AST: 18 U/L (ref 10–35)
Albumin: 3.7 g/dL (ref 3.6–5.1)
Alkaline phosphatase (APISO): 78 U/L (ref 37–153)
BUN/Creatinine Ratio: 7 (calc) (ref 6–22)
BUN: 8 mg/dL (ref 7–25)
CO2: 31 mmol/L (ref 20–32)
Calcium: 9 mg/dL (ref 8.6–10.4)
Chloride: 103 mmol/L (ref 98–110)
Creat: 1.14 mg/dL — ABNORMAL HIGH (ref 0.50–1.05)
Globulin: 2.9 g/dL (calc) (ref 1.9–3.7)
Glucose, Bld: 114 mg/dL — ABNORMAL HIGH (ref 65–99)
Potassium: 3.7 mmol/L (ref 3.5–5.3)
Sodium: 139 mmol/L (ref 135–146)
Total Bilirubin: 0.4 mg/dL (ref 0.2–1.2)
Total Protein: 6.6 g/dL (ref 6.1–8.1)

## 2020-05-12 LAB — HIV-1 RNA QUANT-NO REFLEX-BLD
HIV 1 RNA Quant: 21 copies/mL — ABNORMAL HIGH
HIV-1 RNA Quant, Log: 1.32 Log copies/mL — ABNORMAL HIGH

## 2020-05-12 LAB — CBC
HCT: 33 % — ABNORMAL LOW (ref 35.0–45.0)
Hemoglobin: 10.8 g/dL — ABNORMAL LOW (ref 11.7–15.5)
MCH: 31.4 pg (ref 27.0–33.0)
MCHC: 32.7 g/dL (ref 32.0–36.0)
MCV: 95.9 fL (ref 80.0–100.0)
MPV: 11.5 fL (ref 7.5–12.5)
Platelets: 187 10*3/uL (ref 140–400)
RBC: 3.44 10*6/uL — ABNORMAL LOW (ref 3.80–5.10)
RDW: 14.3 % (ref 11.0–15.0)
WBC: 3.2 10*3/uL — ABNORMAL LOW (ref 3.8–10.8)

## 2020-05-12 LAB — RPR: RPR Ser Ql: NONREACTIVE

## 2020-05-12 LAB — AFP TUMOR MARKER: AFP-Tumor Marker: 3.6 ng/mL

## 2020-05-16 ENCOUNTER — Telehealth: Payer: Self-pay

## 2020-05-16 ENCOUNTER — Other Ambulatory Visit: Payer: Medicare Other

## 2020-05-16 DIAGNOSIS — Z1231 Encounter for screening mammogram for malignant neoplasm of breast: Secondary | ICD-10-CM | POA: Diagnosis not present

## 2020-05-16 NOTE — Telephone Encounter (Signed)
Labs faxed to [facility].   Pernell Lenoir Loyola Mast, RN

## 2020-05-30 ENCOUNTER — Other Ambulatory Visit: Payer: Self-pay

## 2020-05-30 ENCOUNTER — Encounter: Payer: Self-pay | Admitting: Infectious Diseases

## 2020-05-30 ENCOUNTER — Telehealth (INDEPENDENT_AMBULATORY_CARE_PROVIDER_SITE_OTHER): Payer: Medicare Other | Admitting: Infectious Diseases

## 2020-05-30 DIAGNOSIS — D071 Carcinoma in situ of vulva: Secondary | ICD-10-CM

## 2020-05-30 DIAGNOSIS — R7989 Other specified abnormal findings of blood chemistry: Secondary | ICD-10-CM | POA: Diagnosis not present

## 2020-05-30 DIAGNOSIS — Z21 Asymptomatic human immunodeficiency virus [HIV] infection status: Secondary | ICD-10-CM | POA: Diagnosis not present

## 2020-05-30 DIAGNOSIS — D649 Anemia, unspecified: Secondary | ICD-10-CM | POA: Diagnosis not present

## 2020-05-30 DIAGNOSIS — Z8619 Personal history of other infectious and parasitic diseases: Secondary | ICD-10-CM | POA: Diagnosis not present

## 2020-05-30 NOTE — Assessment & Plan Note (Addendum)
Normocytic anemia - ?anemia of chronic disease vs renal associated (less likely at this point with Stage II kidney dysfunction). ?B12 levels to be assessed. Colonoscopy coming up soon to eval GI system.  Could also be related to cirrhosis given leukopenia as well.  She will discuss with PCP at next office visit.

## 2020-05-30 NOTE — Assessment & Plan Note (Signed)
Follow up with Dr. Despina Hidden to be made after her colonoscopy.

## 2020-05-30 NOTE — Assessment & Plan Note (Signed)
She has had durably suppressed viral load for some time now. CD4 has recovered to healthy range as well. No drug interactions identified today. Continue current treatment. Will send Rx to [facility] in [place] for her to get blood work locally to help with transportation for next visit in 6 months. Will do virtual. She follows in person every 3 months with PCP.

## 2020-05-30 NOTE — Assessment & Plan Note (Signed)
Creatinine has been slowly increasing since 2018 (0.9 > 1.14). Has been on TDF in the past with various regimens but switched off d/t bone thinning s/e. There is less of a nephrotoxic risk with TAF in her Biktarvy but not zero risk. At this point we discussed options with her eGFR 72 mL/min and will continue Biktarvy and follow labs.  Alt regimens could be Triumeq or Tivicay + Epzicom if triumeq is too large, Dovato or Juluca.

## 2020-05-30 NOTE — Progress Notes (Signed)
Patient Name: Laura Jones  Date of Birth: [birth year] MRN: [id number]  PCP: Neale Burly, MD    Patient Active Problem List   Diagnosis Date Noted  . Elevated serum creatinine 05/30/2020  . Anemia 05/30/2020  . Hepatic cirrhosis ([place]) 05/27/2019  . Closed bimalleolar fracture of left ankle 02/03/2019  . VIN III (vulvar intraepithelial neoplasia III) 10/14/2018  . OA (osteoarthritis) of hip 06/18/2018  . Hypercholesteremia 12/08/2017  . History of total right hip replacement 09/18/2017  . Hepatitis C virus infection cured after antiviral drug therapy 07/30/2017  . Acid reflux 07/30/2017  . HIV (human immunodeficiency virus infection) ([place]) 07/29/2017  . Cystocele, lateral 07/25/2016  . Avascular necrosis of bone of hip, left ([place]) 09/13/2015  . 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 Depression 07/01/2015   SUBJECTIVE:  Brief Narrative:   Laura Jones is a 49 y.o. female with HIV disease, dx "a long time ago." Relocated from [place] in 2018 where she was in care with [facility].  History of Hep C, cured (s/p treatment x 2, HCV Quant < 15, 07/29/2017).   +Cirrhosis per notes from previous liver biopsies  S/P hysterectomy Hep B sAg (-), Hep B sAb (-) following several attempts to vaccinate.  History of OIs: none known.  HIV Risk: heterosexual contact  Previous Regimens:   Prezista + Norvir + Truvada   Genvoya >> suppressed   Biktarvy 09/2018 >> switched d/t DDI with medications  Genotype:   K103N - NNRTI resistance per chart records    Patient Active Problem List   Diagnosis Date Noted  . Elevated serum creatinine 05/30/2020  . Anemia 05/30/2020  . Hepatic cirrhosis ([place]) 05/27/2019  . Closed bimalleolar fracture of left ankle 02/03/2019  . VIN III (vulvar intraepithelial neoplasia III) 10/14/2018  . OA (osteoarthritis) of hip 06/18/2018  . Hypercholesteremia 12/08/2017  . History of total right hip replacement 09/18/2017  . Hepatitis  C virus infection cured after antiviral drug therapy 07/30/2017  . Acid reflux 07/30/2017  . HIV (human immunodeficiency virus infection) ([place]) 07/29/2017  . Cystocele, lateral 07/25/2016  . Avascular necrosis of bone of hip, left ([place]) 09/13/2015  . 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 Depression 07/01/2015    Chief Complaint  Patient presents with  . Follow-up     HPI/ROS:  She is worried about her blood counts - noticed her red blood cells are lower. She has had some increased fatigue over the last few months but has also been started on a new blood pressure medication (Metoprolol) which is working better for BP control. She has been taking Vitamin B12 for some time now. No obvious signs of blood loss - no changes in BMs; due for colonoscopy in 1 month. Her platelet counts are in normal range, WBC a little low @ 3.2.    Has not seen Dr. Elonda Husky since last August for FU on VINIII and skin check.   Cirrhosis s/p Hep C Treatment - HCC screen last in Feb 2021 --> coarsened parenchymal echogenicity w/o mass or tumor. Normal blood flow. AFP 3.28 Apr 2020. Does not drink alcohol.   Received her first [facility] vaccine last Saturday.   Review of Systems  Constitutional: Positive for fatigue. Negative for appetite change, chills, fever and unexpected weight change.  HENT: Negative for dental problem and sore throat.   Respiratory: Negative for cough.   Cardiovascular: Negative for chest pain and leg swelling.  Gastrointestinal: Negative for abdominal pain, diarrhea and vomiting.  Genitourinary: Negative  for dysuria and flank pain.  Musculoskeletal: Negative for myalgias and neck pain.  Skin: Negative for rash.  Neurological: Negative for dizziness and headaches.  Psychiatric/Behavioral: The patient is not nervous/anxious.      Past Medical History:  Diagnosis Date  . Anemia    hx of   . Anxiety   . Arthritis    left knee  . Avascular necrosis of bone of hip, left ([place])   .  Bulging lumbar disc   . Depression   . Dyspnea    with excertion  . GERD (gastroesophageal reflux disease)   . Hepatitis    C treated 2 years ago went undetected  . History of kidney stones   . HIV (human immunodeficiency virus infection) ([place]) 07/29/2017  . HIV infection ([place])   . Insomnia   . Migraines   . Nerve pain   . Perimenopausal   . Porphyria ([place])   . PTSD (post-traumatic stress disorder)   . Spinal stenosis of lumbosacral region   . Tendonitis    right arm   . Trauma     Social History   Tobacco Use  . Smoking status: Never Smoker  . Smokeless tobacco: Never Used  Substance Use Topics  . Alcohol use: No  . Drug use: No     Allergies  Allergen Reactions  . Levaquin [Levofloxacin] Rash    Difficulty breathing, mouth swelling/rash  . Fish Oil Nausea And Vomiting  . Compazine [Prochlorperazine Edisylate]     Muscle twitching  . Droperidol     uncontrolled muscle movements   . Imitrex [Sumatriptan]     Difficulty breathing, chest pain; All "triptans"  . Reglan [Metoclopramide]     Uncontrolled twitching  . Sulfa Antibiotics Hives    Hives   . Thorazine [Chlorpromazine]     Uncontrolled muscle twitching    Objective:  There were no vitals filed for this visit. There is no height or weight on file to calculate BMI.  Physical Exam Constitutional:      Appearance: Normal appearance. She is not ill-appearing.  HENT:     Mouth/Throat:     Mouth: Mucous membranes are moist.     Pharynx: Oropharynx is clear.  Eyes:     General: No scleral icterus. Pulmonary:     Effort: Pulmonary effort is normal.  Neurological:     Mental Status: She is oriented to person, place, and time.  Psychiatric:        Mood and Affect: Mood normal.        Thought Content: Thought content normal.     Lab Results Lab Results  Component Value Date   WBC 3.2 (L) 05/10/2020   HGB 10.8 (L) 05/10/2020   HCT 33.0 (L) 05/10/2020   MCV 95.9 05/10/2020   PLT 187 05/10/2020     Lab Results  Component Value Date   CREATININE 1.14 (H) 05/10/2020   BUN 8 05/10/2020   NA 139 05/10/2020   K 3.7 05/10/2020   CL 103 05/10/2020   CO2 31 05/10/2020    Lab Results  Component Value Date   ALT 13 05/10/2020   AST 18 05/10/2020   ALKPHOS 95 12/19/2019   BILITOT 0.4 05/10/2020    Lab Results  Component Value Date   CHOL 173 05/11/2019   HDL 52 05/11/2019   LDLCALC 91 05/11/2019   TRIG 198 (H) 05/11/2019   CHOLHDL 3.3 05/11/2019   HIV 1 RNA Quant (copies/mL)  Date Value  05/10/2020 21 (H)  10/29/2019 <20 NOT DETECTED  05/11/2019 <20 NOT DETECTED   CD4 T Cell Abs (/uL)  Date Value  05/10/2020 487  10/29/2019 547  05/11/2019 470   Lab Results  Component Value Date   HAV REACTIVE (A) 07/29/2017   Lab Results  Component Value Date   HEPBSAG NON-REACTIVE 07/29/2017   HEPBSAB NON-REACTIVE 07/29/2017   Lab Results  Component Value Date   QUANTGOLD NEGATIVE 07/29/2017    ASSESSMENT & PLAN:   Problem List Items Addressed This Visit      Unprioritized   HIV (human immunodeficiency virus infection) ([place]) (Chronic)    She has had durably suppressed viral load for some time now. CD4 has recovered to healthy range as well. No drug interactions identified today. Continue current treatment. Will send Rx to [facility] in [place] for her to get blood work locally to help with transportation for next visit in 6 months. Will do virtual. She follows in person every 3 months with PCP.       Hepatitis C virus infection cured after antiviral drug therapy - Primary    + Cirrhosis from previous pre-treatment liver biopsies.  AFP normal range. Will get update ultrasound again in a few months at [facility] for [facility] screening.       Relevant Orders   US ABDOMEN LIMITED RUQ   VIN III (vulvar intraepithelial neoplasia III)    Follow up with Dr. Elonda Husky to be made after her colonoscopy.       Elevated serum creatinine    Creatinine has been slowly  increasing since 2018 (0.9 > 1.14). Has been on TDF in the past with various regimens but switched off d/t bone thinning s/e. There is less of a nephrotoxic risk with TAF in her Biktarvy but not zero risk. At this point we discussed options with her eGFR 72 mL/min and will continue Biktarvy and follow labs.  Alt regimens could be Triumeq or Tivicay + Epzicom if triumeq is too large, Dovato or Juluca.       Anemia    Normocytic anemia - ?anemia of chronic disease vs renal associated (less likely at this point with Stage II kidney dysfunction). ?B12 levels to be assessed. Colonoscopy coming up soon to eval GI system.  Could also be related to cirrhosis given leukopenia as well.  She will discuss with PCP at next office visit.          Janene Madeira, MSN, NP-C [facility] [email or web address] Pager: [phone] Office: [phone] [place]: [phone]

## 2020-05-30 NOTE — Patient Instructions (Addendum)
It was a pleasure to take care of you as always!  Your medication is working perfectly for you and you are doing a great job taking it as prescribed  Please continue your BIKTARVY 1 pill once a day as we discussed.   Vaccines Recommended:   COVID #2 dose.   Recommended Next Office Visit:   6 months   Please go to the [facility] near [facility] for your blood work 2 weeks before  (they do require a phone call to schedule an appointment prior to the draw)  Address: [address] [place], [place] [address]  Phone: [phone]  Will arrange another ultrasound of your liver to follow up any changes - this will be scheduled at [facility].   For your anemia it could be that your Vitamin B12 needs adjusted dose or that there is another reason. I am glad you are having a colonoscopy next month because blood loss from the GI system can cause this too. Please follow up with your primary care team at your next appointment.   The fatigue you are experiencing could be from the anemia or the Metroprolol.   Please continue to be well, stay away from folks that are sick, wash her hands frequently, do not touch her face or mouth and continue to take your medications.

## 2020-05-30 NOTE — Assessment & Plan Note (Signed)
+   Cirrhosis from previous pre-treatment liver biopsies.  AFP normal range. Will get update ultrasound again in a few months at [facility] for [facility] screening.

## 2020-06-01 ENCOUNTER — Encounter: Payer: Self-pay | Admitting: Infectious Diseases

## 2020-06-08 DIAGNOSIS — R928 Other abnormal and inconclusive findings on diagnostic imaging of breast: Secondary | ICD-10-CM | POA: Diagnosis not present

## 2020-06-23 DIAGNOSIS — Z0000 Encounter for general adult medical examination without abnormal findings: Secondary | ICD-10-CM | POA: Diagnosis not present

## 2020-06-23 DIAGNOSIS — E782 Mixed hyperlipidemia: Secondary | ICD-10-CM | POA: Diagnosis not present

## 2020-06-23 DIAGNOSIS — K589 Irritable bowel syndrome without diarrhea: Secondary | ICD-10-CM | POA: Diagnosis not present

## 2020-06-23 DIAGNOSIS — I10 Essential (primary) hypertension: Secondary | ICD-10-CM | POA: Diagnosis not present

## 2020-06-23 DIAGNOSIS — K219 Gastro-esophageal reflux disease without esophagitis: Secondary | ICD-10-CM | POA: Diagnosis not present

## 2020-07-07 ENCOUNTER — Other Ambulatory Visit: Payer: Self-pay | Admitting: Obstetrics & Gynecology

## 2020-07-07 DIAGNOSIS — J40 Bronchitis, not specified as acute or chronic: Secondary | ICD-10-CM | POA: Diagnosis not present

## 2020-07-27 DIAGNOSIS — Z01818 Encounter for other preprocedural examination: Secondary | ICD-10-CM | POA: Diagnosis not present

## 2020-07-29 DIAGNOSIS — Z79899 Other long term (current) drug therapy: Secondary | ICD-10-CM | POA: Diagnosis not present

## 2020-07-29 DIAGNOSIS — Z8711 Personal history of peptic ulcer disease: Secondary | ICD-10-CM | POA: Diagnosis not present

## 2020-07-29 DIAGNOSIS — K219 Gastro-esophageal reflux disease without esophagitis: Secondary | ICD-10-CM | POA: Diagnosis not present

## 2020-07-29 DIAGNOSIS — I10 Essential (primary) hypertension: Secondary | ICD-10-CM | POA: Diagnosis not present

## 2020-07-29 DIAGNOSIS — M1990 Unspecified osteoarthritis, unspecified site: Secondary | ICD-10-CM | POA: Diagnosis not present

## 2020-07-29 DIAGNOSIS — Z791 Long term (current) use of non-steroidal anti-inflammatories (NSAID): Secondary | ICD-10-CM | POA: Diagnosis not present

## 2020-07-29 DIAGNOSIS — Z882 Allergy status to sulfonamides status: Secondary | ICD-10-CM | POA: Diagnosis not present

## 2020-07-29 DIAGNOSIS — Z1211 Encounter for screening for malignant neoplasm of colon: Secondary | ICD-10-CM | POA: Diagnosis not present

## 2020-07-29 DIAGNOSIS — K581 Irritable bowel syndrome with constipation: Secondary | ICD-10-CM | POA: Diagnosis not present

## 2020-07-29 DIAGNOSIS — E785 Hyperlipidemia, unspecified: Secondary | ICD-10-CM | POA: Diagnosis not present

## 2020-09-08 ENCOUNTER — Other Ambulatory Visit: Payer: Self-pay | Admitting: Obstetrics & Gynecology

## 2020-09-09 ENCOUNTER — Other Ambulatory Visit: Payer: Self-pay | Admitting: Infectious Diseases

## 2020-09-09 DIAGNOSIS — Z21 Asymptomatic human immunodeficiency virus [HIV] infection status: Secondary | ICD-10-CM

## 2020-09-25 DIAGNOSIS — S0083XA Contusion of other part of head, initial encounter: Secondary | ICD-10-CM | POA: Diagnosis not present

## 2020-09-25 DIAGNOSIS — S301XXA Contusion of abdominal wall, initial encounter: Secondary | ICD-10-CM | POA: Diagnosis not present

## 2020-09-25 DIAGNOSIS — Z882 Allergy status to sulfonamides status: Secondary | ICD-10-CM | POA: Diagnosis not present

## 2020-09-25 DIAGNOSIS — M62838 Other muscle spasm: Secondary | ICD-10-CM | POA: Diagnosis not present

## 2020-09-25 DIAGNOSIS — K219 Gastro-esophageal reflux disease without esophagitis: Secondary | ICD-10-CM | POA: Diagnosis not present

## 2020-09-25 DIAGNOSIS — R10814 Left lower quadrant abdominal tenderness: Secondary | ICD-10-CM | POA: Diagnosis not present

## 2020-09-25 DIAGNOSIS — Z79899 Other long term (current) drug therapy: Secondary | ICD-10-CM | POA: Diagnosis not present

## 2020-09-25 DIAGNOSIS — Z888 Allergy status to other drugs, medicaments and biological substances status: Secondary | ICD-10-CM | POA: Diagnosis not present

## 2020-09-25 DIAGNOSIS — W1839XA Other fall on same level, initial encounter: Secondary | ICD-10-CM | POA: Diagnosis not present

## 2020-09-25 DIAGNOSIS — Z881 Allergy status to other antibiotic agents status: Secondary | ICD-10-CM | POA: Diagnosis not present

## 2020-09-25 DIAGNOSIS — R519 Headache, unspecified: Secondary | ICD-10-CM | POA: Diagnosis not present

## 2020-09-27 DIAGNOSIS — N189 Chronic kidney disease, unspecified: Secondary | ICD-10-CM | POA: Diagnosis not present

## 2020-09-27 DIAGNOSIS — Z79899 Other long term (current) drug therapy: Secondary | ICD-10-CM | POA: Diagnosis not present

## 2020-09-27 DIAGNOSIS — S0990XA Unspecified injury of head, initial encounter: Secondary | ICD-10-CM | POA: Diagnosis not present

## 2020-10-14 ENCOUNTER — Other Ambulatory Visit: Payer: Self-pay

## 2020-10-14 ENCOUNTER — Ambulatory Visit (HOSPITAL_COMMUNITY)
Admission: RE | Admit: 2020-10-14 | Discharge: 2020-10-14 | Disposition: A | Discharge status: Home / Self Care | Payer: Medicare Other | Source: Ambulatory Visit | Attending: Infectious Diseases | Admitting: Infectious Diseases

## 2020-10-14 DIAGNOSIS — Z8619 Personal history of other infectious and parasitic diseases: Secondary | ICD-10-CM | POA: Insufficient documentation

## 2020-10-17 ENCOUNTER — Ambulatory Visit: Payer: Medicare Other | Admitting: Nutrition

## 2020-10-25 NOTE — Progress Notes (Unsigned)
Pt canceled appt. Migraine.

## 2020-11-24 ENCOUNTER — Telehealth: Payer: Self-pay | Admitting: Nutrition

## 2020-11-24 ENCOUNTER — Ambulatory Visit: Payer: Medicare Other | Admitting: Nutrition

## 2020-11-24 NOTE — Telephone Encounter (Signed)
VM left to call and reschedule missed appt. 

## 2020-12-04 DIAGNOSIS — R0789 Other chest pain: Secondary | ICD-10-CM | POA: Diagnosis not present

## 2020-12-04 DIAGNOSIS — R9431 Abnormal electrocardiogram [ECG] [EKG]: Secondary | ICD-10-CM | POA: Diagnosis not present

## 2020-12-04 DIAGNOSIS — K2970 Gastritis, unspecified, without bleeding: Secondary | ICD-10-CM | POA: Diagnosis not present

## 2020-12-04 DIAGNOSIS — I10 Essential (primary) hypertension: Secondary | ICD-10-CM | POA: Diagnosis not present

## 2020-12-04 DIAGNOSIS — R110 Nausea: Secondary | ICD-10-CM | POA: Diagnosis not present

## 2020-12-04 DIAGNOSIS — R0602 Shortness of breath: Secondary | ICD-10-CM | POA: Diagnosis not present

## 2020-12-04 DIAGNOSIS — Z743 Need for continuous supervision: Secondary | ICD-10-CM | POA: Diagnosis not present

## 2020-12-04 DIAGNOSIS — R079 Chest pain, unspecified: Secondary | ICD-10-CM | POA: Diagnosis not present

## 2020-12-04 DIAGNOSIS — Z882 Allergy status to sulfonamides status: Secondary | ICD-10-CM | POA: Diagnosis not present

## 2020-12-04 DIAGNOSIS — Z881 Allergy status to other antibiotic agents status: Secondary | ICD-10-CM | POA: Diagnosis not present

## 2020-12-04 DIAGNOSIS — E785 Hyperlipidemia, unspecified: Secondary | ICD-10-CM | POA: Diagnosis not present

## 2020-12-04 DIAGNOSIS — R0689 Other abnormalities of breathing: Secondary | ICD-10-CM | POA: Diagnosis not present

## 2020-12-04 DIAGNOSIS — I499 Cardiac arrhythmia, unspecified: Secondary | ICD-10-CM | POA: Diagnosis not present

## 2020-12-04 DIAGNOSIS — K2900 Acute gastritis without bleeding: Secondary | ICD-10-CM | POA: Diagnosis not present

## 2020-12-06 ENCOUNTER — Telehealth (INDEPENDENT_AMBULATORY_CARE_PROVIDER_SITE_OTHER): Payer: Medicare Other | Admitting: Infectious Diseases

## 2020-12-06 ENCOUNTER — Other Ambulatory Visit: Payer: Self-pay

## 2020-12-06 DIAGNOSIS — D071 Carcinoma in situ of vulva: Secondary | ICD-10-CM | POA: Diagnosis not present

## 2020-12-06 DIAGNOSIS — K219 Gastro-esophageal reflux disease without esophagitis: Secondary | ICD-10-CM | POA: Diagnosis not present

## 2020-12-06 DIAGNOSIS — Z21 Asymptomatic human immunodeficiency virus [HIV] infection status: Secondary | ICD-10-CM | POA: Diagnosis not present

## 2020-12-06 DIAGNOSIS — K7460 Unspecified cirrhosis of liver: Secondary | ICD-10-CM

## 2020-12-06 MED ORDER — ONDANSETRON 8 MG PO TBDP: 8.0000 mg | ORAL_TABLET | Freq: Three times a day (TID) | ORAL | 3 refills | Status: DC | PRN

## 2020-12-06 NOTE — Assessment & Plan Note (Addendum)
Recently diagnosed with a/c flare of GERD in ER with atypical chest pain. She is already on Dexilant QD; probably not helpful to add pantoprazole given same mechanism. Famotidine BID for short term relief if needed for exacerbation OK.  She also takes bentyl for abdominal cramping. I think she should consider a GI specialist for consideration of EGD and evaluation of esophagus/stomach.  Will arrange H Pylori testing here with breast test in a few weeks - advised to hold the dexilant 2 weeks before if she can tolerate that. If negative will discuss GI referral.  No tums/rolaids with biktarvy.

## 2020-12-06 NOTE — Assessment & Plan Note (Signed)
Following with Dr. Despina Hidden in [place] s/p excision 2020.

## 2020-12-06 NOTE — Patient Instructions (Addendum)
Nice talking to you!  Please continue the Biktarvy once a day. Will update your labs in a few weeks but I suspect they will look perfect as always.   I want to test you for a stomach infection known as H Pylori - try to make your Dexilant last dose on 12/22 --> plan to check a breath test on January 7th. Nothing to eat prior to, water is OK. You may resume the dexilant after the test if you need to.  Do not start the pantoprazole yet. OK to do over the counter Pepcid if you experience heart burn.   Zofran has been sent in for refill.

## 2020-12-06 NOTE — Progress Notes (Signed)
Patient Name: Laura Jones  Date of Birth: [birth year] MRN: [id number]  PCP: Laura Jones, Laura A, MD   VIRTUAL CARE ENCOUNTER  I connected with Laura Jones on 12/07/20 at  9:30 AM EST by VIDEO and verified that I am speaking with the correct person using two identifiers. **VIDEO visit conducted for >50% with the remaining half on the telephone for consultation/plan   I discussed the limitations, risks, security and privacy concerns of performing an evaluation and management service by telephone and the availability of in person appointments. I also discussed with the patient that there may be Jones patient responsible charge related to this service. The patient expressed understanding and agreed to proceed.  Patient Location: [place] residence   Other Participants:   Provider Location: [facility] Office    Patient Active Problem List   Diagnosis Date Noted  . Elevated serum creatinine 05/30/2020  . Anemia 05/30/2020  . Hepatic cirrhosis (HCC) 05/27/2019  . Closed bimalleolar fracture of left ankle 02/03/2019  . VIN III (vulvar intraepithelial neoplasia III) 10/14/2018  . OA (osteoarthritis) of hip 06/18/2018  . Hypercholesteremia 12/08/2017  . History of total right hip replacement 09/18/2017  . Hepatitis C virus infection cured after antiviral drug therapy 07/30/2017  . Acid reflux 07/30/2017  . HIV (human immunodeficiency virus infection) (HCC) 07/29/2017  . Cystocele, lateral 07/25/2016  . Avascular necrosis of bone of hip, left (HCC) 09/13/2015  . 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 Depression 07/01/2015   SUBJECTIVE:  Brief Narrative:   Laura Jones is Jones 49 y.o. female with HIV disease, dx "Jones long time ago." Relocated from [place] in 2018 where [facility].  History of Hep C, cured (s/p treatment x 2, HCV Quant < 15, 07/29/2017).   +Cirrhosis per notes from previous liver biopsies  S/P hysterectomy Hep B sAg (-), Hep B sAb (-)  following several attempts to vaccinate.  History of OIs: none known.  HIV Risk: heterosexual contact  Previous Regimens:   Prezista + Norvir + Truvada   Genvoya >> suppressed   Biktarvy 09/2018 >> switched d/t DDI with medications  Genotype:   K103N - NNRTI resistance per chart records    Patient Active Problem List   Diagnosis Date Noted  . Elevated serum creatinine 05/30/2020  . Anemia 05/30/2020  . Hepatic cirrhosis (HCC) 05/27/2019  . Closed bimalleolar fracture of left ankle 02/03/2019  . VIN III (vulvar intraepithelial neoplasia III) 10/14/2018  . OA (osteoarthritis) of hip 06/18/2018  . Hypercholesteremia 12/08/2017  . History of total right hip replacement 09/18/2017  . Hepatitis C virus infection cured after antiviral drug therapy 07/30/2017  . Acid reflux 07/30/2017  . HIV (human immunodeficiency virus infection) (HCC) 07/29/2017  . Cystocele, lateral 07/25/2016  . Avascular necrosis of bone of hip, left (HCC) 09/13/2015  . 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 Depression 07/01/2015    Chief Complaint  Patient presents with  . Follow-up    evisit     HPI: Laura FlesherWent to ER Jones few days ago after having chest pains for about Jones week. She also noted Jones bad headache, nausea and overall "feeling terrible." Had second shingles and flu vaccine on 12/09 and felt like most symptoms may have been due to that. Has not picked up the stomach medicines she was prescribed yet; pains have subsided and eased off.  Requesting Jones refill for the zofran. Has not needed it much until recently but now with current problems would like  to have more on hand for future use.   She has been on Dexilant daily for "years" - takes this to prevent heart burn. Previously also on Zantac until the recall. Continues on Bentyl for abdominal cramping with good effect. Not currently in the care of GI specialist.   Doing well on Biktarvy once Jones day. No missed doses or concern for access to medication.  She does not take any rolaids/tums.    Review of Systems  Constitutional: Positive for fatigue. Negative for appetite change, chills, fever and unexpected weight change.  HENT: Negative for dental problem and sore throat.   Respiratory: Negative for cough.   Cardiovascular: Positive for chest pain (resolved recently after ER visit ). Negative for leg swelling.  Gastrointestinal: Positive for abdominal pain and nausea. Negative for diarrhea and vomiting.  Genitourinary: Negative for dysuria and flank pain.  Musculoskeletal: Negative for myalgias and neck pain.  Skin: Negative for rash.  Neurological: Negative for dizziness and headaches.  Psychiatric/Behavioral: The patient is not nervous/anxious.      Past Medical History:  Diagnosis Date  . Anemia    hx of   . Anxiety   . Arthritis    left knee  . Avascular necrosis of bone of hip, left (HCC)   . Bulging lumbar disc   . Depression   . Dyspnea    with excertion  . GERD (gastroesophageal reflux disease)   . Hepatitis    C treated 2 years ago went undetected  . History of kidney stones   . HIV (human immunodeficiency virus infection) (HCC) 07/29/2017  . HIV infection (HCC)   . Insomnia   . Migraines   . Nerve pain   . Perimenopausal   . Porphyria (HCC)   . PTSD (post-traumatic stress disorder)   . Spinal stenosis of lumbosacral region   . Tendonitis    right arm   . Trauma     Social History   Tobacco Use  . Smoking status: Never Smoker  . Smokeless tobacco: Never Used  Vaping Use  . Vaping Use: Never used  Substance Use Topics  . Alcohol use: No  . Drug use: No     Allergies  Allergen Reactions  . Levaquin [Levofloxacin] Rash    Difficulty breathing, mouth swelling/rash  . Fish Oil Nausea And Vomiting  . Compazine [Prochlorperazine Edisylate]     Muscle twitching  . Droperidol     uncontrolled muscle movements   . Imitrex [Sumatriptan]     Difficulty breathing, chest pain; All "triptans"  . Reglan  [Metoclopramide]     Uncontrolled twitching  . Sulfa Antibiotics Hives    Hives   . Thorazine [Chlorpromazine]     Uncontrolled muscle twitching    Objective:  There were no vitals filed for this visit. There is no height or weight on file to calculate BMI.  Physical Exam Constitutional:      Appearance: Normal appearance. She is not ill-appearing.  HENT:     Mouth/Throat:     Mouth: Mucous membranes are moist.     Pharynx: Oropharynx is clear.  Eyes:     General: No scleral icterus. Pulmonary:     Effort: Pulmonary effort is normal.  Neurological:     Mental Status: She is oriented to person, place, and time.  Psychiatric:        Mood and Affect: Mood normal.        Thought Content: Thought content normal.     Lab Results Lab  Results  Component Value Date   WBC 3.2 (L) 05/10/2020   HGB 10.8 (L) 05/10/2020   HCT 33.0 (L) 05/10/2020   MCV 95.9 05/10/2020   PLT 187 05/10/2020    Lab Results  Component Value Date   CREATININE 1.14 (H) 05/10/2020   BUN 8 05/10/2020   NA 139 05/10/2020   K 3.7 05/10/2020   CL 103 05/10/2020   CO2 31 05/10/2020    Lab Results  Component Value Date   ALT 13 05/10/2020   AST 18 05/10/2020   ALKPHOS 95 12/19/2019   BILITOT 0.4 05/10/2020    Lab Results  Component Value Date   CHOL 173 05/11/2019   HDL 52 05/11/2019   LDLCALC 91 05/11/2019   TRIG 198 (H) 05/11/2019   CHOLHDL 3.3 05/11/2019   HIV 1 RNA Quant (copies/mL)  Date Value  05/10/2020 21 (H)  10/29/2019 <20 NOT DETECTED  05/11/2019 <20 NOT DETECTED   CD4 T Cell Abs (/uL)  Date Value  05/10/2020 487  10/29/2019 547  05/11/2019 470   Lab Results  Component Value Date   HAV REACTIVE (Jones) 07/29/2017   Lab Results  Component Value Date   HEPBSAG NON-REACTIVE 07/29/2017   HEPBSAB NON-REACTIVE 07/29/2017   Lab Results  Component Value Date   QUANTGOLD NEGATIVE 07/29/2017    ASSESSMENT & PLAN:   Problem List Items Addressed This Visit       Unprioritized   VIN III (vulvar intraepithelial neoplasia III)    Following with Dr. Despina Hidden in [place] s/p excision 2020.      HIV (human immunodeficiency virus infection) (HCC) (Chronic)    Unfortunately no HIV-pertinent labs were run at last visit to [facility]. SCr stable @ 1.2. She has been undetectable for years now and I suspect this will still be the case. CD4 has stayed between 450-550 as well. Will update them when she comes to the office for Hpylori test in Jones few weeks.   Vaccination counseling discussed at today's visit. Updated as outlined per recommendations for [facility] at today's visit. She has received flu vaccine for this season and will get Pfizer booster shot arranged after she recovers from Shingrix second dose.  Last cervical cancer screening:  HM PAP         PAP SMEAR-Modifier (Every 3 Years) Next due on 10/13/2021   10/13/2018  Cytology - PAP( [place])         Results were: abnormal --> s/p hysterectomy. VINIII s/p vulvectomy. In care with GYN  Contraception: status post hysterectomy. No family planning or pre-conception counseling needed at this time.  Last Mammogram: HM Mammogram         Overdue - MAMMOGRAM (Every 2 Years) Overdue - never done   No completion history exists for this topic.         Results were: normal Last colon cancer screening: HM Colonoscopy         Overdue - COLONOSCOPY (Every 10 Years) Overdue - never done   No completion history exists for this topic.         Depression screening discussed today - no needs at this time.  Patient is receiving dental care through private care dentist. No needs identified today.        Hepatic cirrhosis (HCC)    Will plan to repeat ultrasound for [facility] screen with AFP in February 2022.       Acid reflux    Recently diagnosed with Jones/c flare of GERD in ER with  atypical chest pain. She is already on Dexilant QD; probably not helpful to add pantoprazole given same mechanism. Famotidine BID for  short term relief if needed for exacerbation OK.  She also takes bentyl for abdominal cramping. I think she should consider Jones GI specialist for consideration of EGD and evaluation of esophagus/stomach.  Will arrange H Pylori testing here with breast test in Jones few weeks - advised to hold the dexilant 2 weeks before if she can tolerate that. If negative will discuss GI referral.  No tums/rolaids with biktarvy.       Relevant Medications   famotidine (PEPCID) 20 MG tablet   pantoprazole (PROTONIX) 20 MG tablet   ondansetron (ZOFRAN-ODT) 8 MG disintegrating tablet   Other Relevant Orders   H. pylori breath test    Other Visit Diagnoses    Asymptomatic HIV infection (HCC)    -  Primary   Relevant Orders   HIV-1 RNA quant-no reflex-bld   T-helper cell (CD4)- ([facility] clinic only)      Rexene Alberts, MSN, NP-C [facility]  [email or web address] Pager: [phone] Office: [phone] [facility] Main Line: [phone]

## 2020-12-07 ENCOUNTER — Encounter: Payer: Self-pay | Admitting: Infectious Diseases

## 2020-12-07 NOTE — Assessment & Plan Note (Signed)
Unfortunately no HIV-pertinent labs were run at last visit to [facility]. SCr stable @ 1.2. She has been undetectable for years now and I suspect this will still be the case. CD4 has stayed between 450-550 as well. Will update them when she comes to the office for Hpylori test in a few weeks.   Vaccination counseling discussed at today's visit. Updated as outlined per recommendations for [facility] at today's visit. She has received flu vaccine for this season and will get Pfizer booster shot arranged after she recovers from Shingrix second dose.  Last cervical cancer screening:  HM PAP         PAP SMEAR-Modifier (Every 3 Years) Next due on 10/13/2021   10/13/2018  Cytology - PAP( [place])         Results were: abnormal --> s/p hysterectomy. VINIII s/p vulvectomy. In care with GYN  Contraception: status post hysterectomy. No family planning or pre-conception counseling needed at this time.  Last Mammogram: HM Mammogram         Overdue - MAMMOGRAM (Every 2 Years) Overdue - never done   No completion history exists for this topic.         Results were: normal Last colon cancer screening: HM Colonoscopy         Overdue - COLONOSCOPY (Every 10 Years) Overdue - never done   No completion history exists for this topic.         Depression screening discussed today - no needs at this time.  Patient is receiving dental care through private care dentist. No needs identified today.

## 2020-12-07 NOTE — Assessment & Plan Note (Signed)
Will plan to repeat ultrasound for [facility] screen with AFP in February 2022.

## 2020-12-13 DIAGNOSIS — R0789 Other chest pain: Secondary | ICD-10-CM | POA: Diagnosis not present

## 2020-12-13 DIAGNOSIS — Z0000 Encounter for general adult medical examination without abnormal findings: Secondary | ICD-10-CM | POA: Diagnosis not present

## 2020-12-15 DIAGNOSIS — R0781 Pleurodynia: Secondary | ICD-10-CM | POA: Diagnosis not present

## 2020-12-29 ENCOUNTER — Telehealth: Payer: Self-pay | Admitting: *Deleted

## 2020-12-29 ENCOUNTER — Other Ambulatory Visit: Payer: Self-pay

## 2020-12-29 ENCOUNTER — Ambulatory Visit: Payer: Medicare Other | Attending: Internal Medicine

## 2020-12-29 ENCOUNTER — Other Ambulatory Visit: Payer: Medicare Other

## 2020-12-29 DIAGNOSIS — K219 Gastro-esophageal reflux disease without esophagitis: Secondary | ICD-10-CM | POA: Diagnosis not present

## 2020-12-29 DIAGNOSIS — Z23 Encounter for immunization: Secondary | ICD-10-CM

## 2020-12-29 DIAGNOSIS — Z21 Asymptomatic human immunodeficiency virus [HIV] infection status: Secondary | ICD-10-CM

## 2020-12-29 NOTE — Progress Notes (Signed)
   Covid-19 Vaccination Clinic  Name:  Laura Jones    MRN: [id number] DOB: 1971-03-09  12/29/2020  [name] was observed post Covid-19 immunization for 15 minutes without incident. She was provided with Vaccine Information Sheet and instruction to access the V-Safe system.   [name] was instructed to call 911 with any severe reactions post vaccine: Marland Kitchen Difficulty breathing  . Swelling of face and throat  . A fast heartbeat  . A bad rash all over body  . Dizziness and weakness   Immunizations Administered    Name Date Dose VIS Date Route   Pfizer COVID-19 Vaccine 12/29/2020  3:10 PM 0.3 mL 10/12/2020 Intramuscular   Manufacturer: [employer], [employer]   Lot: [id number]   NDC: [address]-4

## 2020-12-29 NOTE — Telephone Encounter (Signed)
Patient here for her h pylori breath test. Has been off dexilant as requested by [facility]. She asked if it was ok to restart dexilant, as she is having a lot of stomach discomfort.  OK per Judeth Cornfield to restart dexilant. RN called patient, left voicemail with this information. Andree Coss, RN

## 2020-12-30 LAB — T-HELPER CELL (CD4) - (RCID CLINIC ONLY)
CD4 % Helper T Cell: 29 % — ABNORMAL LOW (ref 33–65)
CD4 T Cell Abs: 758 /uL (ref 400–1790)

## 2020-12-30 LAB — H. PYLORI BREATH TEST: H. pylori Breath Test: NOT DETECTED

## 2021-01-09 LAB — HIV-1 RNA QUANT-NO REFLEX-BLD
HIV 1 RNA Quant: <20 Copies/mL
HIV-1 RNA Quant, Log: <1.3 Log cps/mL

## 2021-02-02 ENCOUNTER — Other Ambulatory Visit: Payer: Self-pay | Admitting: Infectious Diseases

## 2021-02-02 DIAGNOSIS — Z21 Asymptomatic human immunodeficiency virus [HIV] infection status: Secondary | ICD-10-CM

## 2021-02-09 ENCOUNTER — Telehealth: Payer: Self-pay | Admitting: Obstetrics & Gynecology

## 2021-02-09 NOTE — Telephone Encounter (Signed)
Patient wants to discuss possible yeast infection( didn't want a nurse appt). And wants to discuss vaginal pain. Clinical staff will follow up with patient.

## 2021-02-09 NOTE — Telephone Encounter (Signed)
Spoke with patient she has some itching and pain on the inside of her vagina. She took 2 diflucan but it hasn't helped. No recent abx use. Patient doesn't want nurse visit for self swab. States that she prefers to see Dr. Despina Hidden. She was given next available appointment on 3/4. She wanted to see if there is anything Dr. Despina Hidden can do in the meantime to help her sx. Advised I would send message and let her know.

## 2021-02-09 NOTE — Telephone Encounter (Signed)
I guess you can add her on tomorrow

## 2021-02-10 ENCOUNTER — Encounter: Payer: Self-pay | Admitting: Obstetrics & Gynecology

## 2021-02-10 ENCOUNTER — Ambulatory Visit (INDEPENDENT_AMBULATORY_CARE_PROVIDER_SITE_OTHER): Payer: Medicare Other | Admitting: Obstetrics & Gynecology

## 2021-02-10 ENCOUNTER — Other Ambulatory Visit: Payer: Self-pay

## 2021-02-10 VITALS — BP 106/64 | HR 74 | Ht 61.0 in | Wt 178.0 lb

## 2021-02-10 DIAGNOSIS — B3731 Acute candidiasis of vulva and vagina: Secondary | ICD-10-CM

## 2021-02-10 DIAGNOSIS — B373 Candidiasis of vulva and vagina: Secondary | ICD-10-CM

## 2021-02-10 IMAGING — US US ABDOMEN COMPLETE WITH ELASTOGRAPHY
2 series · 13 of 25 positions shown · non-contrast
Comparison: None.

CLINICAL DATA: Hepatic cirrhosis.  Chronic hepatitis-C.



[Series 1: us abdomen complete with elastography · 11 of 78 slices shown (1 of 2)]
[im 1/78]
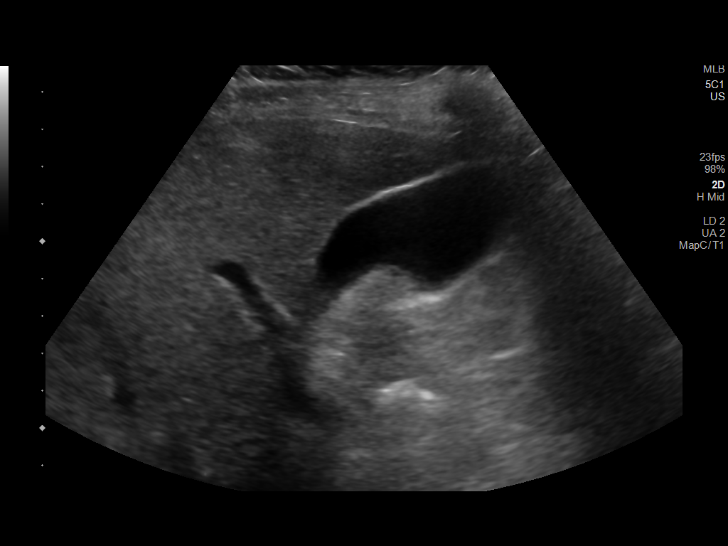
[im 8/78]
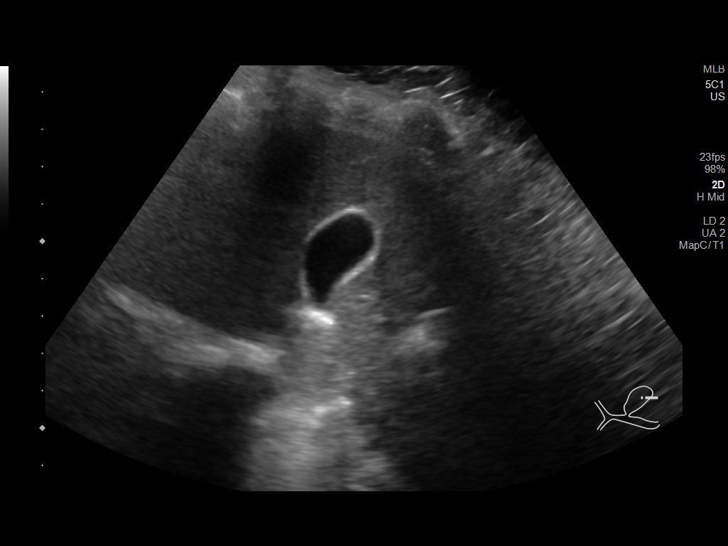
[im 16/78]
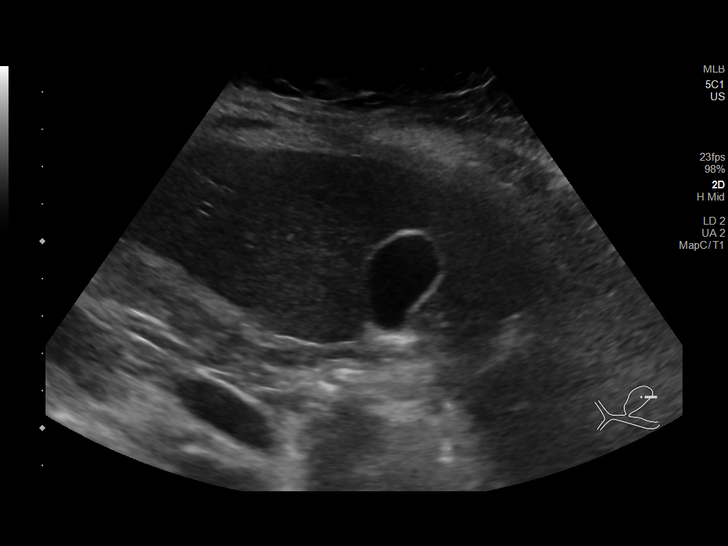
[im 24/78]
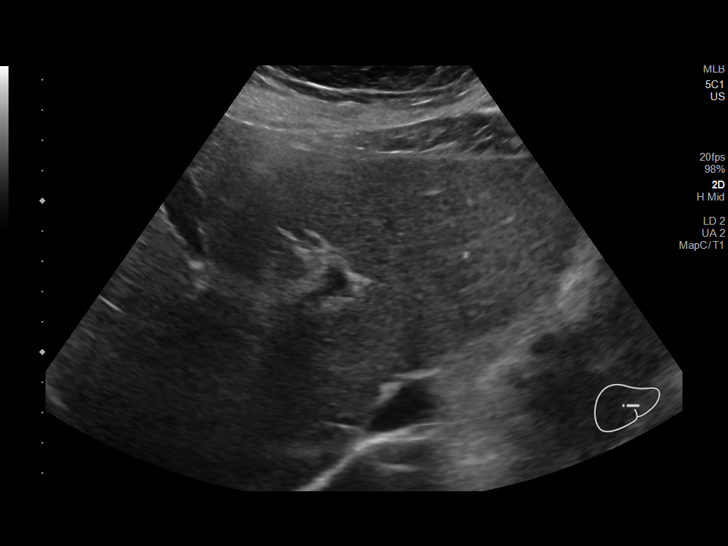
[im 31/78]
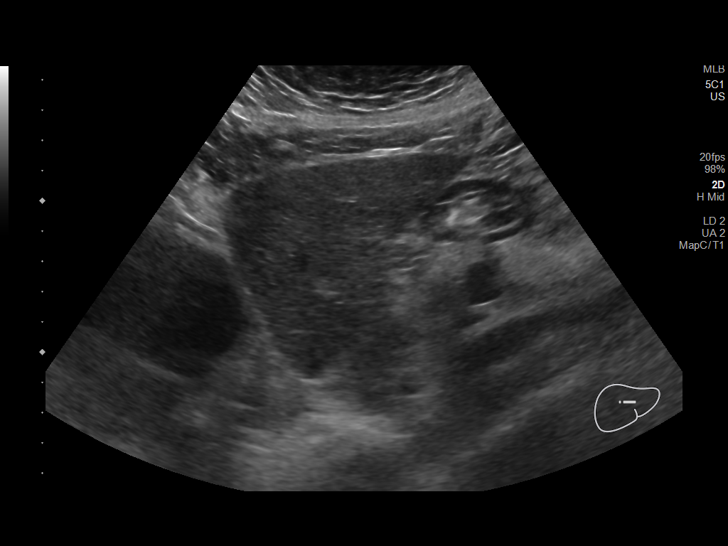
[im 39/78]
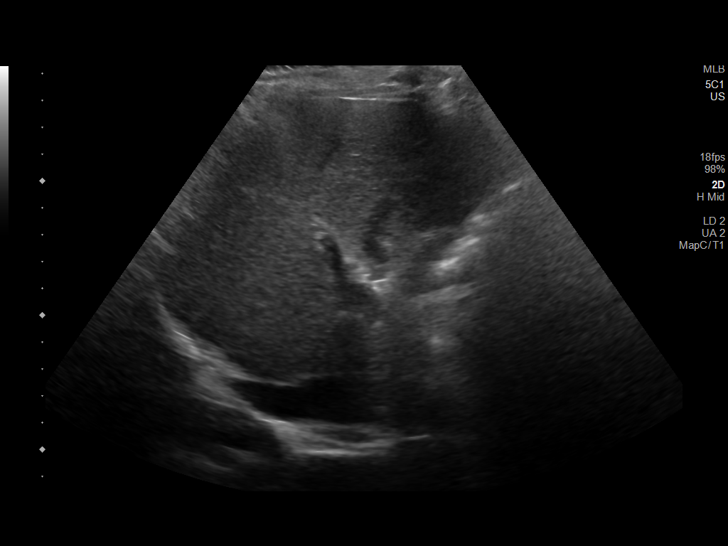
[im 47/78]
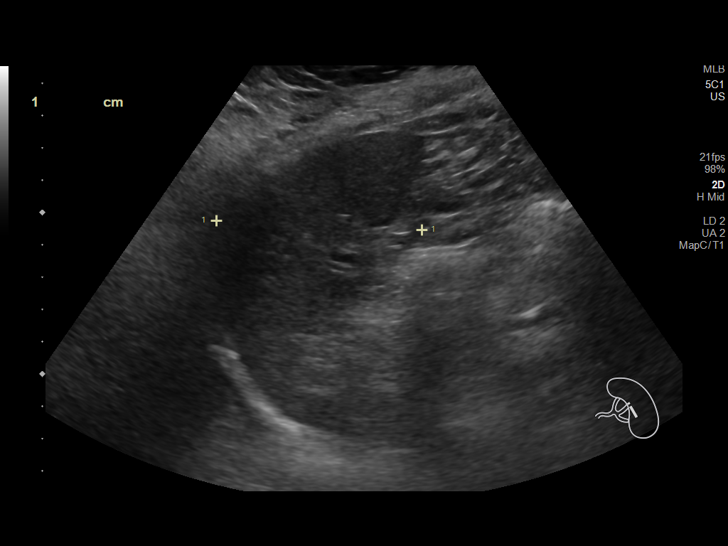
[im 54/78]
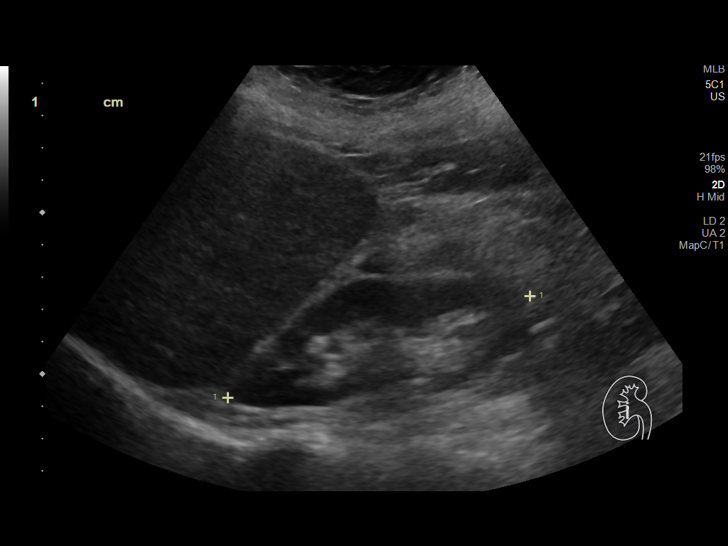
[im 62/78]
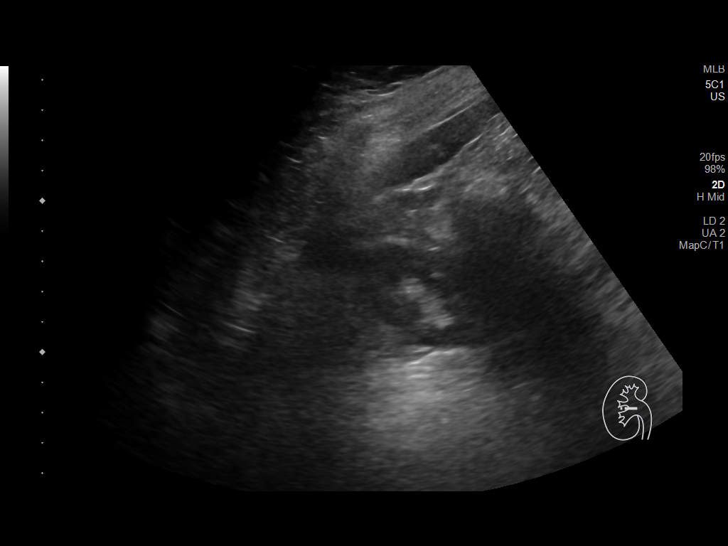
[im 70/78]
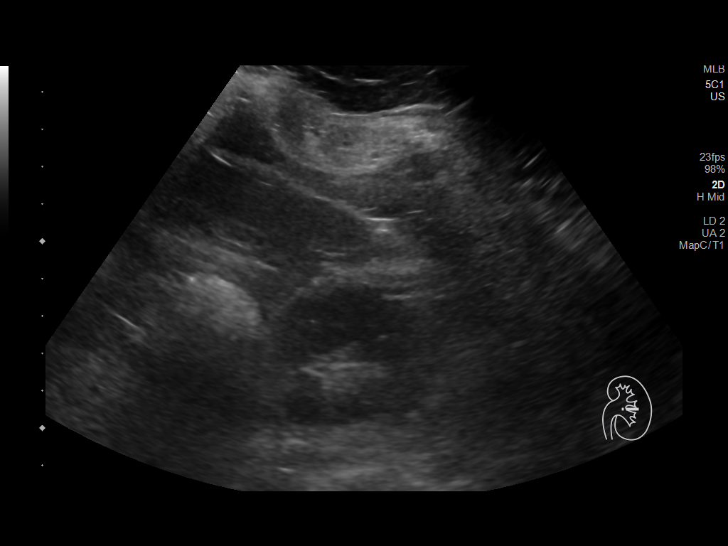
[im 78/78]
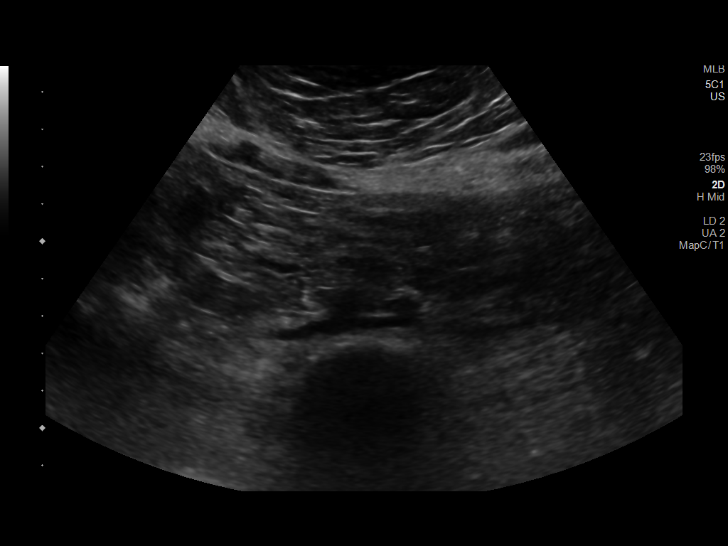

[Series 2: us abdomen complete with elastography · 2 of 13 slices shown (2 of 2)]
[im 5/13]
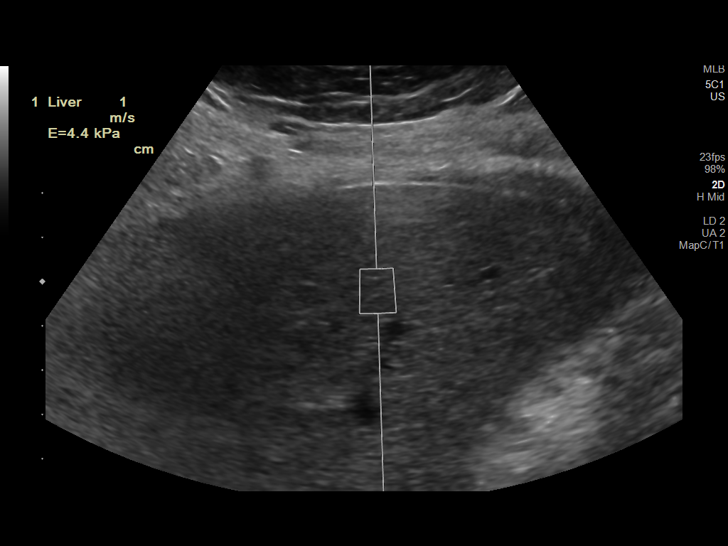
[im 13/13]
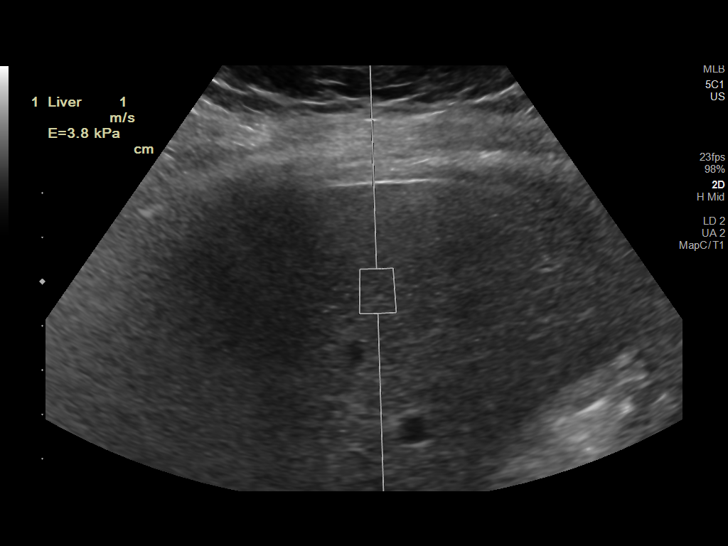

[13 of 25 positions shown; findings below may reference images not displayed]

FINDINGS: ULTRASOUND ABDOMEN

Gallbladder: No gallstones or wall thickening visualized. No
sonographic Murphy sign noted by sonographer.

Common bile duct: Diameter: 4 mm

Liver: Liver parenchyma is diffusely coarsened in echotexture. Liver
surface irregularity. No definite no liver masses. Portal vein is
patent on color Doppler imaging with normal direction of blood flow
towards the liver.

IVC: No abnormality visualized.

Pancreas: Visualized portion unremarkable.

Spleen: Size and appearance within normal limits.

Right Kidney: Length: 9.9 cm. Echogenicity within normal limits. No
mass or hydronephrosis visualized.

Left Kidney: Length: 9.6 cm. Echogenicity within normal limits. No
mass or hydronephrosis visualized.

Abdominal aorta: No aneurysm visualized.

Other findings: None.

ULTRASOUND HEPATIC ELASTOGRAPHY

Device: Siemens Helix VTQ

Patient position: Supine

Transducer [id number]

Number of measurements: 10

Hepatic segment:  8

Median velocity:   1.25 m/sec

IQR:

IQR/Median velocity ratio:

Corresponding Metavir fibrosis score:  F2 + some F3

Risk of fibrosis: Moderate

Limitations of exam: None

Please note that abnormal shear wave velocities may also be
identified in clinical settings other than with hepatic fibrosis,
such as: acute hepatitis, elevated right heart and central venous
pressures including use of beta blockers, Adora disease
(Bervan), infiltrative processes such as
mastocytosis/amyloidosis/infiltrative tumor, extrahepatic
cholestasis, in the post-prandial state, and liver transplantation.
Correlation with patient history, laboratory data, and clinical
condition recommended.
IMPRESSION: ULTRASOUND ABDOMEN:

1. Diffusely coarsened liver parenchymal echotexture, nonspecific,
suggesting hepatic fibrosis. No overt morphologic changes of hepatic
cirrhosis. No liver masses.
2. Otherwise normal abdominal sonogram.

ULTRASOUND HEPATIC ELASTOGRAPHY:

Median hepatic shear wave velocity is calculated at 1.25 m/sec.

Corresponding Metavir fibrosis score is F2 + some F3.

Risk of fibrosis is Moderate.

Follow-up: Additional testing appropriate.

## 2021-02-10 MED ORDER — TERCONAZOLE 0.4 % VA CREA: 1.0000 | TOPICAL_CREAM | Freq: Every day | VAGINAL | 1 refills | Status: DC

## 2021-02-10 NOTE — Progress Notes (Signed)
Chief Complaint  Patient presents with  . Vaginal Itching    And Pain      49 y.o. G2P1011 Patient's last menstrual period was 12/25/2003. The current method of family planning is .  Outpatient Encounter Medications as of 02/10/2021  Medication Sig Note  . amLODipine (NORVASC) 5 MG tablet Take 5 mg by mouth daily.   Marland Kitchen atorvastatin (LIPITOR) 10 MG tablet Take 10 mg by mouth at bedtime.   Marland Kitchen BIKTARVY 50-200-25 MG TABS tablet TAKE 1 TABLET BY MOUTH EVERY DAY   . buPROPion (ZYBAN) 150 MG 12 hr tablet Take 150 mg by mouth 2 (two) times daily.   . busPIRone (BUSPAR) 15 MG tablet Take 15 mg by mouth 2 (two) times daily.   . Cholecalciferol (VITAMIN D) 50 MCG (2000 UT) tablet Take 4,000 Units by mouth daily.    . citalopram (CELEXA) 40 MG tablet Take 40 mg by mouth daily.   Marland Kitchen dexlansoprazole (DEXILANT) 60 MG capsule Take 60 mg by mouth daily.   Marland Kitchen dicyclomine (BENTYL) 10 MG capsule Take 10 mg by mouth 2 (two) times daily.    . diphenhydramine-acetaminophen (TYLENOL PM) 25-500 MG TABS tablet Take 1 tablet by mouth daily as needed.   . docusate sodium (COLACE) 100 MG capsule Take 100 mg by mouth daily.    Marland Kitchen loperamide (IMODIUM A-D) 2 MG tablet Take 2 mg by mouth as needed for diarrhea or loose stools.   . metoprolol tartrate (LOPRESSOR) 25 MG tablet Take 25 mg by mouth daily.   . ondansetron (ZOFRAN-ODT) 8 MG disintegrating tablet Take 1 tablet (8 mg total) by mouth every 8 (eight) hours as needed for nausea or vomiting.   . senna (SENOKOT) 8.6 MG tablet Take 1 tablet by mouth daily.    Marland Kitchen terconazole (TERAZOL 7) 0.4 % vaginal cream Place 1 applicator vaginally at bedtime.   Marland Kitchen tiZANidine (ZANAFLEX) 4 MG tablet Take 4 mg by mouth at bedtime.   . traZODone (DESYREL) 100 MG tablet Take 100-200 mg by mouth at bedtime.   . vitamin B-12 (CYANOCOBALAMIN) 500 MCG tablet Take 500 mcg by mouth daily. Two tablets at night 08/12/2019: ON HOLD    No facility-administered encounter medications on file  as of 02/10/2021.    Subjective Pt presents complaining of itching over the for about week Moderate Has had before I checked her for diabetes previously was negative Was treated with diflucan 2 tablets last monday Past Medical History:  Diagnosis Date  . Anemia    hx of   . Anxiety   . Arthritis    left knee  . Avascular necrosis of bone of hip, left (HCC)   . Bulging lumbar disc   . Depression   . Dyspnea    with excertion  . GERD (gastroesophageal reflux disease)   . Hepatitis    C treated 2 years ago went undetected  . History of kidney stones   . HIV (human immunodeficiency virus infection) (HCC) 07/29/2017  . HIV infection (HCC)   . Insomnia   . Migraines   . Nerve pain   . Perimenopausal   . Porphyria (HCC)   . PTSD (post-traumatic stress disorder)   . Spinal stenosis of lumbosacral region   . Tendonitis    right arm   . Trauma     Past Surgical History:  Procedure Laterality Date  . ABDOMINAL HYSTERECTOMY    . ANKLE SURGERY Left 01/26/2019  . CESAREAN SECTION    .  ESOPHAGOGASTRODUODENOSCOPY     x2  . INCONTINENCE SURGERY    . JOINT REPLACEMENT     left hip replacement Dr. Lequita Halt 06-18-18  . TOTAL HIP ARTHROPLASTY Right 02/2013  . TOTAL HIP ARTHROPLASTY Left 06/18/2018   Procedure: LEFT TOTAL HIP ARTHROPLASTY ANTERIOR APPROACH;  Surgeon: Ollen Gross, MD;  Location: [facility];  Service: Orthopedics;  Laterality: Left;  . TUBAL LIGATION    . URETHRAL SLING    . VULVECTOMY PARTIAL N/A 08/19/2019   Procedure: VULVECTOMY PARTIAL;  Surgeon: Lazaro Arms, MD;  Location: [facility];  Service: Gynecology;  Laterality: N/A;    OB History    Gravida  2   Para  1   Term  1   Preterm      AB  1   Living  1     SAB  1   IAB      Ectopic      Multiple      Live Births  1           Allergies  Allergen Reactions  . Levaquin [Levofloxacin] Rash    Difficulty breathing, mouth swelling/rash  . Fish Oil Nausea And Vomiting  . Compazine  [Prochlorperazine Edisylate]     Muscle twitching  . Droperidol     uncontrolled muscle movements   . Imitrex [Sumatriptan]     Difficulty breathing, chest pain; All "triptans"  . Reglan [Metoclopramide]     Uncontrolled twitching  . Sulfa Antibiotics Hives    Hives   . Thorazine [Chlorpromazine]     Uncontrolled muscle twitching    Social History   Socioeconomic History  . Marital status: Divorced    Spouse name: Not on file  . Number of children: Not on file  . Years of education: Not on file  . Highest education level: Not on file  Occupational History  . Not on file  Tobacco Use  . Smoking status: Never Smoker  . Smokeless tobacco: Never Used  Vaping Use  . Vaping Use: Never used  Substance and Sexual Activity  . Alcohol use: No  . Drug use: No  . Sexual activity: Not Currently    Birth control/protection: Surgical    Comment: hyst  Other Topics Concern  . Not on file  Social History Narrative  . Not on file   Social Determinants of Health   Financial Resource Strain: Not on file  Food Insecurity: Not on file  Transportation Needs: Not on file  Physical Activity: Not on file  Stress: Not on file  Social Connections: Not on file    Family History  Problem Relation Age of Onset  . COPD Mother   . Osteoporosis Mother   . Hypertension Father   . Healthy Sister   . Heart disease Maternal Grandmother   . Heart disease Maternal Grandfather     Medications:       Current Outpatient Medications:  .  amLODipine (NORVASC) 5 MG tablet, Take 5 mg by mouth daily., Disp: , Rfl:  .  atorvastatin (LIPITOR) 10 MG tablet, Take 10 mg by mouth at bedtime., Disp: , Rfl: 0 .  BIKTARVY 50-200-25 MG TABS tablet, TAKE 1 TABLET BY MOUTH EVERY DAY, Disp: 30 tablet, Rfl: 4 .  buPROPion (ZYBAN) 150 MG 12 hr tablet, Take 150 mg by mouth 2 (two) times daily., Disp: , Rfl:  .  busPIRone (BUSPAR) 15 MG tablet, Take 15 mg by mouth 2 (two) times daily., Disp: , Rfl: 0 .  Cholecalciferol (VITAMIN D) 50 MCG (2000 UT) tablet, Take 4,000 Units by mouth daily. , Disp: , Rfl:  .  citalopram (CELEXA) 40 MG tablet, Take 40 mg by mouth daily., Disp: , Rfl:  .  dexlansoprazole (DEXILANT) 60 MG capsule, Take 60 mg by mouth daily., Disp: , Rfl:  .  dicyclomine (BENTYL) 10 MG capsule, Take 10 mg by mouth 2 (two) times daily. , Disp: , Rfl:  .  diphenhydramine-acetaminophen (TYLENOL PM) 25-500 MG TABS tablet, Take 1 tablet by mouth daily as needed., Disp: , Rfl:  .  docusate sodium (COLACE) 100 MG capsule, Take 100 mg by mouth daily. , Disp: , Rfl:  .  loperamide (IMODIUM A-D) 2 MG tablet, Take 2 mg by mouth as needed for diarrhea or loose stools., Disp: , Rfl:  .  metoprolol tartrate (LOPRESSOR) 25 MG tablet, Take 25 mg by mouth daily., Disp: , Rfl:  .  ondansetron (ZOFRAN-ODT) 8 MG disintegrating tablet, Take 1 tablet (8 mg total) by mouth every 8 (eight) hours as needed for nausea or vomiting., Disp: 30 tablet, Rfl: 3 .  senna (SENOKOT) 8.6 MG tablet, Take 1 tablet by mouth daily. , Disp: , Rfl:  .  terconazole (TERAZOL 7) 0.4 % vaginal cream, Place 1 applicator vaginally at bedtime., Disp: 45 g, Rfl: 1 .  tiZANidine (ZANAFLEX) 4 MG tablet, Take 4 mg by mouth at bedtime., Disp: , Rfl:  .  traZODone (DESYREL) 100 MG tablet, Take 100-200 mg by mouth at bedtime., Disp: , Rfl:  .  vitamin B-12 (CYANOCOBALAMIN) 500 MCG tablet, Take 500 mcg by mouth daily. Two tablets at night, Disp: , Rfl:   Objective Blood pressure 106/64, pulse 74, height 5\' 1"  (1.549 m), weight 178 lb (80.7 kg), last menstrual period 12/25/2003.  General WDWN female NAD Vulva:  normal appearing vulva with no masses, tenderness or lesions Vagina:  normal mucosa, +yeast present Cervix:  Normal no lesions    Pertinent ROS No burning with urination, frequency or urgency No nausea, vomiting or diarrhea Nor fever chills or other constitutional symptoms   Labs or studies No  new    Impression Diagnoses this Encounter::   ICD-10-CM   1. Candidal vulvovaginitis, did not respond to diflucan  B37.3    Painter with gentian violet, terazol x 7 1 RF    Established relevant diagnosis(es):   Plan/Recommendations: Meds ordered this encounter  Medications  . terconazole (TERAZOL 7) 0.4 % vaginal cream    Sig: Place 1 applicator vaginally at bedtime.    Dispense:  45 g    Refill:  1    Labs or Scans Ordered: No orders of the defined types were placed in this encounter.   Management:: As above  Follow up No follow-ups on file.      All questions were answered.

## 2021-02-15 ENCOUNTER — Telehealth: Payer: Self-pay | Admitting: Obstetrics & Gynecology

## 2021-02-15 NOTE — Telephone Encounter (Signed)
Patient called stating that she seen Dr. Despina Hidden and was prescribed 7 days worth of medication and she has taken the medication and she states that the itching has stopped but she states that she is having cramping in her vaginal area and her back. Pt would like a call back from the nurse or a provider that is on call because she states that it is pretty painful. Please contact pt

## 2021-02-15 NOTE — Telephone Encounter (Signed)
Returned pt's call for clarification. Pt explains that she is on the last day of cream administration. She has had cramping during treatment, which has improved. She c/o swelling to the point of having issues with the applicator. She admits that she may have caused irritation with the applicator. She is using Tylenol and Advil for the pain and a heating pad for her back. No additional discharge noted aside from the cream. Informed her that a msg would be sent to Dr. Despina Hidden for further advisement.

## 2021-02-16 NOTE — Telephone Encounter (Signed)
Called pt to inform her that Dr. Despina Hidden did not have any additional recommendations at this point. No answer, left vm.

## 2021-02-16 NOTE — Telephone Encounter (Signed)
I don't have any further recommendations at this point

## 2021-02-20 DIAGNOSIS — M810 Age-related osteoporosis without current pathological fracture: Secondary | ICD-10-CM | POA: Diagnosis not present

## 2021-02-24 ENCOUNTER — Ambulatory Visit: Payer: Medicare Other | Admitting: Obstetrics & Gynecology

## 2021-04-19 DIAGNOSIS — K219 Gastro-esophageal reflux disease without esophagitis: Secondary | ICD-10-CM | POA: Diagnosis not present

## 2021-04-19 DIAGNOSIS — E782 Mixed hyperlipidemia: Secondary | ICD-10-CM | POA: Diagnosis not present

## 2021-04-19 DIAGNOSIS — R0789 Other chest pain: Secondary | ICD-10-CM | POA: Diagnosis not present

## 2021-04-19 DIAGNOSIS — Z0000 Encounter for general adult medical examination without abnormal findings: Secondary | ICD-10-CM | POA: Diagnosis not present

## 2021-05-13 ENCOUNTER — Other Ambulatory Visit: Payer: Self-pay | Admitting: Infectious Diseases

## 2021-05-25 DIAGNOSIS — J40 Bronchitis, not specified as acute or chronic: Secondary | ICD-10-CM | POA: Diagnosis not present

## 2021-05-25 DIAGNOSIS — I10 Essential (primary) hypertension: Secondary | ICD-10-CM | POA: Diagnosis not present

## 2021-05-25 DIAGNOSIS — E782 Mixed hyperlipidemia: Secondary | ICD-10-CM | POA: Diagnosis not present

## 2021-05-25 DIAGNOSIS — K219 Gastro-esophageal reflux disease without esophagitis: Secondary | ICD-10-CM | POA: Diagnosis not present

## 2021-05-29 DIAGNOSIS — Z1231 Encounter for screening mammogram for malignant neoplasm of breast: Secondary | ICD-10-CM | POA: Diagnosis not present

## 2021-06-05 ENCOUNTER — Other Ambulatory Visit: Payer: Self-pay | Admitting: Infectious Diseases

## 2021-06-05 DIAGNOSIS — Z21 Asymptomatic human immunodeficiency virus [HIV] infection status: Secondary | ICD-10-CM

## 2021-06-19 ENCOUNTER — Other Ambulatory Visit: Payer: Self-pay | Admitting: Infectious Diseases

## 2021-06-21 DIAGNOSIS — R059 Cough, unspecified: Secondary | ICD-10-CM | POA: Diagnosis not present

## 2021-06-21 DIAGNOSIS — R0602 Shortness of breath: Secondary | ICD-10-CM | POA: Diagnosis not present

## 2021-06-21 DIAGNOSIS — R079 Chest pain, unspecified: Secondary | ICD-10-CM | POA: Diagnosis not present

## 2021-06-24 DIAGNOSIS — R059 Cough, unspecified: Secondary | ICD-10-CM | POA: Diagnosis not present

## 2021-06-24 DIAGNOSIS — J45909 Unspecified asthma, uncomplicated: Secondary | ICD-10-CM | POA: Diagnosis not present

## 2021-06-24 DIAGNOSIS — J069 Acute upper respiratory infection, unspecified: Secondary | ICD-10-CM | POA: Diagnosis not present

## 2021-06-24 DIAGNOSIS — Z743 Need for continuous supervision: Secondary | ICD-10-CM | POA: Diagnosis not present

## 2021-06-24 DIAGNOSIS — B9789 Other viral agents as the cause of diseases classified elsewhere: Secondary | ICD-10-CM | POA: Diagnosis not present

## 2021-06-24 DIAGNOSIS — Z882 Allergy status to sulfonamides status: Secondary | ICD-10-CM | POA: Diagnosis not present

## 2021-06-24 DIAGNOSIS — Z20822 Contact with and (suspected) exposure to covid-19: Secondary | ICD-10-CM | POA: Diagnosis not present

## 2021-06-24 DIAGNOSIS — Z79899 Other long term (current) drug therapy: Secondary | ICD-10-CM | POA: Diagnosis not present

## 2021-06-24 DIAGNOSIS — R519 Headache, unspecified: Secondary | ICD-10-CM | POA: Diagnosis not present

## 2021-06-24 DIAGNOSIS — F32A Depression, unspecified: Secondary | ICD-10-CM | POA: Diagnosis not present

## 2021-07-01 ENCOUNTER — Other Ambulatory Visit: Payer: Self-pay | Admitting: Infectious Diseases

## 2021-07-01 DIAGNOSIS — Z21 Asymptomatic human immunodeficiency virus [HIV] infection status: Secondary | ICD-10-CM

## 2021-07-26 ENCOUNTER — Telehealth: Payer: Self-pay

## 2021-07-26 ENCOUNTER — Other Ambulatory Visit: Payer: Self-pay | Admitting: Infectious Diseases

## 2021-07-26 DIAGNOSIS — Z21 Asymptomatic human immunodeficiency virus [HIV] infection status: Secondary | ICD-10-CM

## 2021-07-26 NOTE — Telephone Encounter (Signed)
Orders for patient faxed to [facility] per patient request; VL, CD4, CMP, CBC with diff and lipid panel orders faxed.   [address] [place], [place] [address] P: [phone] F: [phone]  Rosanna Randy, RN

## 2021-07-26 NOTE — Telephone Encounter (Signed)
Patient has been scheduled for a Follow up with [facility] on 8/23 at 11am and would like to have Lab orders sent to [facility] in [place]. Address: [address] [place], [place] [address], Number: [phone]. Best contact number for patient if any questions is [phone].

## 2021-08-03 DIAGNOSIS — Z7689 Persons encountering health services in other specified circumstances: Secondary | ICD-10-CM | POA: Diagnosis not present

## 2021-08-15 ENCOUNTER — Ambulatory Visit (INDEPENDENT_AMBULATORY_CARE_PROVIDER_SITE_OTHER): Payer: Medicare Other | Admitting: Infectious Diseases

## 2021-08-15 ENCOUNTER — Other Ambulatory Visit: Payer: Self-pay

## 2021-08-15 ENCOUNTER — Ambulatory Visit: Payer: Medicare Other

## 2021-08-15 ENCOUNTER — Encounter: Payer: Self-pay | Admitting: Infectious Diseases

## 2021-08-15 VITALS — BP 91/61 | HR 72 | Temp 98.2°F | Wt 185.0 lb

## 2021-08-15 DIAGNOSIS — Z8619 Personal history of other infectious and parasitic diseases: Secondary | ICD-10-CM

## 2021-08-15 DIAGNOSIS — K7460 Unspecified cirrhosis of liver: Secondary | ICD-10-CM

## 2021-08-15 DIAGNOSIS — E7800 Pure hypercholesterolemia, unspecified: Secondary | ICD-10-CM

## 2021-08-15 DIAGNOSIS — Z21 Asymptomatic human immunodeficiency virus [HIV] infection status: Secondary | ICD-10-CM

## 2021-08-15 MED ORDER — BIKTARVY 50-200-25 MG PO TABS: 1.0000 | ORAL_TABLET | Freq: Every day | ORAL | 11 refills | Status: DC

## 2021-08-15 NOTE — Progress Notes (Signed)
Patient Name: Laura Jones  Date of Birth: June 13, 1971 MRN: [id number]  PCP: Laura Deiters, MD    SUBJECTIVE:  Brief Narrative:   Laura Jones is a 50 y.o. female with HIV disease, dx "a long time ago." Relocated from [place] in 2018 where she was in care with [facility].  History of Hep C, cured (s/p treatment x 2, HCV Quant < 15, 07/29/2017).   +Cirrhosis per notes from previous liver biopsies  S/P hysterectomy Hep B sAg (-), Hep B sAb (-) following several attempts to vaccinate.  History of OIs: none known.  HIV Risk: heterosexual contact  Previous Regimens:  Prezista + Norvir + Truvada  Genvoya >> suppressed  Biktarvy 09/2018 >> switched d/t DDI with medications  Genotype:  [id number] - NNRTI resistance per chart records    Patient Active Problem List   Diagnosis Date Noted   Elevated serum creatinine 05/30/2020   Anemia 05/30/2020   Hepatic cirrhosis (HCC) 05/27/2019   Closed bimalleolar fracture of left ankle 02/03/2019   VIN III (vulvar intraepithelial neoplasia III) 10/14/2018   OA (osteoarthritis) of hip 06/18/2018   Hypercholesteremia 12/08/2017   History of total right hip replacement 09/18/2017   Hepatitis C virus infection cured after antiviral drug therapy 07/30/2017   Acid reflux 07/30/2017   HIV (human immunodeficiency virus infection) (HCC) 07/29/2017   Cystocele, lateral 07/25/2016   Avascular necrosis of bone of hip, left (HCC) 09/13/2015   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  Concerned about her triglycerides from labs recently - they were pretty high and she can't recall if she ate or not prior to the test. She has had a prolonged URI with cough requiring treatment since July and still has some lingering cough; but overall much improved. Delsym seems to be helpful for her.   She does have some days where she  misses her Biktarvy, especially if she is sick - she just finds she forgets to take it. Does not happen often but enough to bring up the thought maybe Cabenuva is better for her and would like to discuss further.    Review of Systems  Constitutional:  Positive for fatigue. Negative for appetite change, chills, fever and unexpected weight change.  HENT:  Negative for mouth sores, sore throat and trouble swallowing.   Eyes:  Negative for pain and visual disturbance.  Respiratory:  Positive for cough. Negative for shortness of breath.   Cardiovascular:  Negative for chest pain.  Gastrointestinal:  Negative for abdominal pain, diarrhea and nausea.  Genitourinary:  Negative for dysuria, menstrual problem and pelvic pain.  Musculoskeletal:  Negative for back pain and neck pain.  Skin:  Negative for color change and rash.  Neurological:  Negative for weakness, numbness and headaches.  Hematological:  Negative for adenopathy.  Psychiatric/Behavioral:  Negative for dysphoric mood. The patient is not nervous/anxious.   d  Past Medical History:  Diagnosis Date   Anemia    hx of    Anxiety    Arthritis    left knee   Avascular necrosis of bone of hip, left (HCC)    Bulging lumbar disc    Depression    Dyspnea    with excertion   GERD (gastroesophageal reflux disease)    Hepatitis    C treated 2 years ago went undetected   History of kidney  stones    HIV (human immunodeficiency virus infection) (HCC) 07/29/2017   HIV infection (HCC)    Insomnia    Migraines    Nerve pain    Perimenopausal    Porphyria (HCC)    PTSD (post-traumatic stress disorder)    Spinal stenosis of lumbosacral region    Tendonitis    right arm    Trauma     Social History   Tobacco Use   Smoking status: Never   Smokeless tobacco: Never  Vaping Use   Vaping Use: Never used  Substance Use Topics   Alcohol use: No   Drug use: No     Allergies  Allergen Reactions   Levaquin [Levofloxacin] Rash     Difficulty breathing, mouth swelling/rash   Fish Oil Nausea And Vomiting   Compazine [Prochlorperazine Edisylate]     Muscle twitching   Droperidol     uncontrolled muscle movements    Imitrex [Sumatriptan]     Difficulty breathing, chest pain; All "triptans"   Reglan [Metoclopramide]     Uncontrolled twitching   Sulfa Antibiotics Hives    Hives    Tagamet (Premixed [Cimetidine]    Thorazine [Chlorpromazine]     Uncontrolled muscle twitching    Objective:  Vitals:   08/15/21 1057  BP: 91/61  Pulse: 72  Temp: 98.2 F (36.8 C)  TempSrc: Oral  SpO2: 97%  Weight: 185 lb (83.9 kg)   Body mass index is 34.96 kg/m.  Physical Exam Constitutional:      Appearance: Normal appearance. She is not ill-appearing.  HENT:     Mouth/Throat:     Mouth: Mucous membranes are moist.     Pharynx: Oropharynx is clear.  Eyes:     General: No scleral icterus. Cardiovascular:     Rate and Rhythm: Normal rate and regular rhythm.  Pulmonary:     Effort: Pulmonary effort is normal.  Neurological:     Mental Status: She is oriented to person, place, and time.  Psychiatric:        Mood and Affect: Mood normal.        Thought Content: Thought content normal.    Lab Results See scanned results - 08/03/2021: VL < 20 and CD4 834 SCr 0.95    ASSESSMENT & PLAN:   Problem List Items Addressed This Visit       Unprioritized   HIV (human immunodeficiency virus infection) (HCC) (Chronic)    Very well controlled on once daily biktarvy. She brings up concern over missing some doses and interested in [place]. I think she is a reasonable candidate for this given she has not been on NNRTI in the past from record review and liver disease is compensate.  We discussed transition process to long acting injectables and will check with her insurance to see about ability for q39m injections.  She will get them here at the clinic and will have more frequent viral load checks to ensure therapeutic  response.  FU tbd on cabenuva plan, otherwise can follow up 6-23m if no med changes.       Relevant Medications   bictegravir-emtricitabine-tenofovir AF (BIKTARVY) 50-200-25 MG TABS tablet   Hypercholesteremia    Total cholesterol 169 HDL 134 TG 394 LDL not able to be calculated - this is a very strange pattern compared to 2020 labs and I wonder about an error.   Will give her rx to get this redrawn at her convenience fasting for 8 hours to better interpret. She is on  low dose statin for primary prevention and no history of cardiac events.       Hepatitis C virus infection cured after antiviral drug therapy - Primary   Relevant Orders   US ABDOMEN LIMITED RUQ (LIVER/GB)   Hepatic cirrhosis (HCC)    Repeat screening ultrasound due - will help to arrange for October. LFTs stable, plt 213K.       Other Visit Diagnoses     Asymptomatic HIV infection (HCC)  (Chronic)      Relevant Medications   bictegravir-emtricitabine-tenofovir AF (BIKTARVY) 50-200-25 MG TABS tablet       Rexene Alberts, MSN, NP-C [facility]  [email or web address] Pager: [phone] Office: [phone] [facility] Main Line: [phone]

## 2021-08-15 NOTE — Assessment & Plan Note (Signed)
Very well controlled on once daily biktarvy. She brings up concern over missing some doses and interested in [place]. I think she is a reasonable candidate for this given she has not been on NNRTI in the past from record review and liver disease is compensate.  We discussed transition process to long acting injectables and will check with her insurance to see about ability for q48m injections.  She will get them here at the clinic and will have more frequent viral load checks to ensure therapeutic response.  FU tbd on cabenuva plan, otherwise can follow up 6-1m if no med changes.

## 2021-08-15 NOTE — Assessment & Plan Note (Signed)
Total cholesterol 169 HDL 134 TG 394 LDL not able to be calculated - this is a very strange pattern compared to 2020 labs and I wonder about an error.   Will give her rx to get this redrawn at her convenience fasting for 8 hours to better interpret. She is on low dose statin for primary prevention and no history of cardiac events.

## 2021-08-15 NOTE — Assessment & Plan Note (Addendum)
Repeat screening ultrasound due - will help to arrange for October. LFTs stable, plt 213K.

## 2021-08-15 NOTE — Patient Instructions (Addendum)
Will look into Cabenuva for you and contact you to discuss further. Will schedule you for injections if you decide to switch.   Will need to monitor your liver and your viral load closely to ensure it is a good medication for you.   If you decide to continue Biktarvy we can plan to see you back again in 6-8 months.   Please take the prescription to [facility] to have your lipid test redone fasting (nothing but water for 8 hours prior to the test).   Boric Acid vaginal Suppositories may be helpful for treatment of yeast infections. They are inserted up the vagina once nightly for 2 weeks. You can typically find these over the counter but if you need help I can get them compounded at a pharmacy and mailed to you.

## 2021-08-23 DIAGNOSIS — I10 Essential (primary) hypertension: Secondary | ICD-10-CM | POA: Diagnosis not present

## 2021-08-23 DIAGNOSIS — K219 Gastro-esophageal reflux disease without esophagitis: Secondary | ICD-10-CM | POA: Diagnosis not present

## 2021-08-23 DIAGNOSIS — E782 Mixed hyperlipidemia: Secondary | ICD-10-CM | POA: Diagnosis not present

## 2021-08-30 ENCOUNTER — Encounter: Payer: Self-pay | Admitting: Infectious Diseases

## 2021-09-14 DIAGNOSIS — K219 Gastro-esophageal reflux disease without esophagitis: Secondary | ICD-10-CM | POA: Diagnosis not present

## 2021-09-14 DIAGNOSIS — Z0000 Encounter for general adult medical examination without abnormal findings: Secondary | ICD-10-CM | POA: Diagnosis not present

## 2021-09-14 DIAGNOSIS — E782 Mixed hyperlipidemia: Secondary | ICD-10-CM | POA: Diagnosis not present

## 2021-09-14 DIAGNOSIS — I10 Essential (primary) hypertension: Secondary | ICD-10-CM | POA: Diagnosis not present

## 2021-09-22 DIAGNOSIS — K219 Gastro-esophageal reflux disease without esophagitis: Secondary | ICD-10-CM | POA: Diagnosis not present

## 2021-09-22 DIAGNOSIS — I10 Essential (primary) hypertension: Secondary | ICD-10-CM | POA: Diagnosis not present

## 2021-09-22 DIAGNOSIS — E782 Mixed hyperlipidemia: Secondary | ICD-10-CM | POA: Diagnosis not present

## 2021-09-25 ENCOUNTER — Ambulatory Visit (HOSPITAL_COMMUNITY): Payer: Medicare Other

## 2021-10-02 ENCOUNTER — Other Ambulatory Visit: Payer: Self-pay

## 2021-10-02 ENCOUNTER — Ambulatory Visit (HOSPITAL_COMMUNITY)
Admission: RE | Admit: 2021-10-02 | Discharge: 2021-10-02 | Disposition: A | Discharge status: Home / Self Care | Payer: Medicare Other | Source: Ambulatory Visit | Attending: Infectious Diseases | Admitting: Infectious Diseases

## 2021-10-02 DIAGNOSIS — E7800 Pure hypercholesterolemia, unspecified: Secondary | ICD-10-CM | POA: Diagnosis not present

## 2021-10-02 DIAGNOSIS — Z8619 Personal history of other infectious and parasitic diseases: Secondary | ICD-10-CM | POA: Diagnosis not present

## 2021-10-23 DIAGNOSIS — I10 Essential (primary) hypertension: Secondary | ICD-10-CM | POA: Diagnosis not present

## 2021-10-23 DIAGNOSIS — K219 Gastro-esophageal reflux disease without esophagitis: Secondary | ICD-10-CM | POA: Diagnosis not present

## 2021-10-23 DIAGNOSIS — E782 Mixed hyperlipidemia: Secondary | ICD-10-CM | POA: Diagnosis not present

## 2021-10-24 DIAGNOSIS — M25572 Pain in left ankle and joints of left foot: Secondary | ICD-10-CM | POA: Diagnosis not present

## 2021-10-24 DIAGNOSIS — G8929 Other chronic pain: Secondary | ICD-10-CM | POA: Diagnosis not present

## 2021-10-30 DIAGNOSIS — G8929 Other chronic pain: Secondary | ICD-10-CM | POA: Diagnosis not present

## 2021-10-30 DIAGNOSIS — M25572 Pain in left ankle and joints of left foot: Secondary | ICD-10-CM | POA: Diagnosis not present

## 2021-11-09 ENCOUNTER — Other Ambulatory Visit (HOSPITAL_COMMUNITY): Payer: Self-pay

## 2021-11-09 ENCOUNTER — Telehealth: Payer: Self-pay

## 2021-11-09 NOTE — Telephone Encounter (Signed)
[facility] Patient Advocate Encounter  Cabenuva is covered under the patient pharmacy benefits.   Prescription can be filled at [facility].  Meghna Hagmann, CPhT [facility] Phone: [phone] Fax:  [phone]  

## 2021-11-10 ENCOUNTER — Encounter: Payer: Self-pay | Admitting: Pharmacist

## 2021-11-21 ENCOUNTER — Other Ambulatory Visit: Payer: Self-pay | Admitting: Pharmacist

## 2021-11-21 ENCOUNTER — Other Ambulatory Visit (HOSPITAL_COMMUNITY): Payer: Self-pay

## 2021-11-21 DIAGNOSIS — Z21 Asymptomatic human immunodeficiency virus [HIV] infection status: Secondary | ICD-10-CM

## 2021-11-21 MED ORDER — CABOTEGRAVIR & RILPIVIRINE ER 600 & 900 MG/3ML IM SUER
1.0000 | INTRAMUSCULAR | 5 refills | Status: DC
  Filled 2021-11-21 – 2022-01-16 (×2): qty 6, 60d supply, fill #0
  Filled 2022-03-05: qty 6, 60d supply, fill #1
  Filled 2022-06-19: qty 6, 60d supply, fill #2
  Filled 2022-08-22: qty 6, 60d supply, fill #3
  Filled 2022-10-16: qty 6, 60d supply, fill #4

## 2021-11-21 MED ORDER — CABOTEGRAVIR & RILPIVIRINE ER 600 & 900 MG/3ML IM SUER
1.0000 | INTRAMUSCULAR | 1 refills | Status: DC
  Filled 2021-11-21 (×2): qty 6, 30d supply, fill #0
  Filled 2021-12-19: qty 6, 30d supply, fill #1

## 2021-11-22 ENCOUNTER — Telehealth: Payer: Self-pay

## 2021-11-22 NOTE — Telephone Encounter (Signed)
[facility] Patient Advocate Encounter  Patient's medication (Cabenuva) have been couriered to [facility] from [facility] and will be administered on patient next office visit on 12/06/21.  Romond Pipkins , CPhT [facility] Phone: [phone] Fax:  [phone]  

## 2021-11-30 DIAGNOSIS — Z79899 Other long term (current) drug therapy: Secondary | ICD-10-CM | POA: Diagnosis not present

## 2021-11-30 DIAGNOSIS — R21 Rash and other nonspecific skin eruption: Secondary | ICD-10-CM | POA: Diagnosis not present

## 2021-11-30 DIAGNOSIS — L03211 Cellulitis of face: Secondary | ICD-10-CM | POA: Diagnosis not present

## 2021-11-30 DIAGNOSIS — S0086XA Insect bite (nonvenomous) of other part of head, initial encounter: Secondary | ICD-10-CM | POA: Diagnosis not present

## 2021-12-06 ENCOUNTER — Ambulatory Visit (INDEPENDENT_AMBULATORY_CARE_PROVIDER_SITE_OTHER): Payer: Medicare Other | Admitting: Pharmacist

## 2021-12-06 ENCOUNTER — Other Ambulatory Visit: Payer: Self-pay

## 2021-12-06 DIAGNOSIS — Z21 Asymptomatic human immunodeficiency virus [HIV] infection status: Secondary | ICD-10-CM

## 2021-12-06 MED ORDER — CABOTEGRAVIR & RILPIVIRINE ER 600 & 900 MG/3ML IM SUER
1.0000 | Freq: Once | INTRAMUSCULAR | Status: AC
Start: 2021-12-06 — End: 2021-12-06
  Administered 2021-12-06: 1 via INTRAMUSCULAR

## 2021-12-06 NOTE — Progress Notes (Signed)
HPI: Laura Jones is a 50 y.o. female who presents to the [place] [facility] for [place] administration.  Patient Active Problem List   Diagnosis Date Noted   Elevated serum creatinine 05/30/2020   Anemia 05/30/2020   Hepatic cirrhosis ([place]) 05/27/2019   Closed bimalleolar fracture of left ankle 02/03/2019   VIN III (vulvar intraepithelial neoplasia III) 10/14/2018   OA (osteoarthritis) of hip 06/18/2018   Hypercholesteremia 12/08/2017   History of total right hip replacement 09/18/2017   Hepatitis C virus infection cured after antiviral drug therapy 07/30/2017   Acid reflux 07/30/2017   HIV (human immunodeficiency virus infection) ([place]) 07/29/2017   Cystocele, lateral 07/25/2016   Avascular necrosis of bone of hip, left ([place]) 09/13/2015   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   ATORVASTATIN (LIPITOR) 10 MG TABLET    Take 10 mg by mouth at bedtime.   BICTEGRAVIR-EMTRICITABINE-TENOFOVIR AF (BIKTARVY) 50-200-25 MG TABS TABLET    Take 1 tablet by mouth daily.   BUPROPION (ZYBAN) 150 MG 12 HR TABLET    Take 150 mg by mouth 2 (two) times daily.   BUSPIRONE (BUSPAR) 15 MG TABLET    Take 15 mg by mouth 2 (two) times daily.   CABOTEGRAVIR & RILPIVIRINE ER (CABENUVA) 600 & 900 MG/3ML INJECTION    Inject 1 kit into the muscle every 30 (thirty) days.   CABOTEGRAVIR & RILPIVIRINE ER (CABENUVA) 600 & 900 MG/3ML INJECTION    Inject 1 kit into the muscle every 2 (two) months.   CHOLECALCIFEROL (VITAMIN D) 50 MCG (2000 UT) TABLET    Take 4,000 Units by mouth daily.    CITALOPRAM (CELEXA) 40 MG TABLET    Take 40 mg by mouth daily.   DEXLANSOPRAZOLE (DEXILANT) 60 MG CAPSULE    Take 60 mg by mouth daily.   DICYCLOMINE (BENTYL) 10 MG CAPSULE    Take 10 mg by mouth 2 (two) times daily.     DIPHENHYDRAMINE-ACETAMINOPHEN (TYLENOL PM) 25-500 MG TABS TABLET    Take 1 tablet by mouth daily as needed.   DOCUSATE SODIUM (COLACE) 100 MG CAPSULE    Take 100 mg by mouth daily.    IBANDRONATE (BONIVA) 150 MG TABLET    Take 150 mg by mouth every 30 (thirty) days.   LOPERAMIDE (IMODIUM A-D) 2 MG TABLET    Take 2 mg by mouth as needed for diarrhea or loose stools.   METOPROLOL TARTRATE (LOPRESSOR) 25 MG TABLET    Take 25 mg by mouth daily.   ONDANSETRON (ZOFRAN-ODT) 8 MG DISINTEGRATING TABLET    TAKE 1 TABLET BY MOUTH EVERY 8 HOURS AS NEEDED FOR NAUSEA AND VOMITING   SENNA (SENOKOT) 8.6 MG TABLET    Take 1 tablet by mouth daily.    TIZANIDINE (ZANAFLEX) 4 MG TABLET    Take 4 mg by mouth at bedtime.   TRAZODONE (DESYREL) 100 MG TABLET    Take 100-200 mg by mouth at bedtime.   VITAMIN B-12 (CYANOCOBALAMIN) 500 MCG TABLET    Take 500 mcg by mouth daily. Two tablets at night  Modified Medications   No medications on file  Discontinued Medications   No medications on file    Allergies: Allergies  Allergen Reactions   Levaquin [Levofloxacin] Rash    Difficulty breathing, mouth swelling/rash  Fish Oil Nausea And Vomiting   Compazine [Prochlorperazine Edisylate]     Muscle twitching   Droperidol     uncontrolled muscle movements    Imitrex [Sumatriptan]     Difficulty breathing, chest pain; All "triptans"   Reglan [Metoclopramide]     Uncontrolled twitching   Sulfa Antibiotics Hives    Hives    Tagamet (Premixed [Cimetidine]    Thorazine [Chlorpromazine]     Uncontrolled muscle twitching    Past Medical History: Past Medical History:  Diagnosis Date   Anemia    hx of    Anxiety    Arthritis    left knee   Avascular necrosis of bone of hip, left (HCC)    Bulging lumbar disc    Depression    Dyspnea    with excertion   GERD (gastroesophageal reflux disease)    Hepatitis    C treated 2 years ago went undetected   History of kidney stones    HIV (human immunodeficiency  virus infection) ([place]) 07/29/2017   HIV infection (HCC)    Insomnia    Migraines    Nerve pain    Perimenopausal    Porphyria ([place])    PTSD (post-traumatic stress disorder)    Spinal stenosis of lumbosacral region    Tendonitis    right arm    Trauma     Social History: Social History   Socioeconomic History   Marital status: Divorced    Spouse name: Not on file   Number of children: Not on file   Years of education: Not on file   Highest education level: Not on file  Occupational History   Not on file  Tobacco Use   Smoking status: Never   Smokeless tobacco: Never  Vaping Use   Vaping Use: Never used  Substance and Sexual Activity   Alcohol use: No   Drug use: No   Sexual activity: Not Currently    Birth control/protection: Surgical    Comment: hyst  Other Topics Concern   Not on file  Social History Narrative   Not on file   Social Determinants of Health   Financial Resource Strain: Not on file  Food Insecurity: Not on file  Transportation Needs: Not on file  Physical Activity: Not on file  Stress: Not on file  Social Connections: Not on file    Labs: Lab Results  Component Value Date   HIV1RNAQUANT <20 12/29/2020   HIV1RNAQUANT 21 (H) 05/10/2020   HIV1RNAQUANT <20 NOT DETECTED 10/29/2019   CD4TABS 758 12/29/2020   CD4TABS 487 05/10/2020   CD4TABS 547 10/29/2019    RPR and STI Lab Results  Component Value Date   LABRPR NON-REACTIVE 05/10/2020    STI Results GC CT  05/10/2020 Negative Negative    Hepatitis B Lab Results  Component Value Date   HEPBSAB NON-REACTIVE 07/29/2017   HEPBSAG NON-REACTIVE 07/29/2017   Hepatitis C No results found for: HEPCAB, HCVRNAPCRQN Hepatitis A Lab Results  Component Value Date   HAV REACTIVE (A) 07/29/2017   Lipids: Lab Results  Component Value Date   CHOL 173 05/11/2019   TRIG 198 (H) 05/11/2019   HDL 52 05/11/2019   CHOLHDL 3.3 05/11/2019   VLDL 65 (H) 07/29/2017   LDLCALC 91 05/11/2019     Current HIV Regimen: Biktarvy  TARGET DATE: The 14th of the month  Assessment: Zollie presents today for their first initiation injection for Cabenuva. Counseled that [place] is two separate intramuscular injections in the gluteal  muscle on each side for each visit. Explained that the second injection is 30 days after the initial injection then every 2 months thereafter. Discussed the need for viral load monitoring every 2 months for the first 6 months and then periodically afterwards as their provider sees the need. Discussed the rare but significant chance of developing resistance despite compliance. Explained that showing up to injection appointments is very important and warned that if 2 appointments are missed, it will be reassessed by their provider whether they are a good candidate for injection therapy. Counseled on possible side effects associated with the injections such as injection site pain, which is usually mild to moderate in nature, injection site nodules, and injection site reactions. Asked to call the clinic or send me a mychart message if they experience any issues, such as fatigue, nausea, headache, rash, or dizziness. Advised that they can take ibuprofen or tylenol for injection site pain if needed.   Administered cabotegravir 628m/3mL in left upper outer quadrant of the gluteal muscle. Administered rilpivirine 900 mg/336min the right upper outer quadrant of the gluteal muscle. Monitored patient for 10 minutes after injection. Injections were tolerated well without issue. Counseled to stop taking Biktarvy after today's dose and to call with any issues that may arise. Will make follow up appointments for second initiation injection in 30 days and then maintenance injections every 2 months thereafter for 6 months.   Plan: - Stop Biktarvy after today's dose - First Cabenuva injections administered - Second initiation injection scheduled for 01/08/22 with me (will order labs  during this visit) - Maintenance injections scheduled for 03/06/22 with [place] [place] 05/07/22 with me - Call with any issues or questions  Burnie Hank L. Jessica Checketts, PharmD, BCIDP, AAHIVP, CPLunenburglinical Pharmacist Practitioner [place] [facility] [place]

## 2021-12-13 ENCOUNTER — Other Ambulatory Visit (HOSPITAL_COMMUNITY): Payer: Self-pay

## 2021-12-19 ENCOUNTER — Other Ambulatory Visit (HOSPITAL_COMMUNITY): Payer: Self-pay

## 2021-12-20 ENCOUNTER — Telehealth: Payer: Self-pay

## 2021-12-20 NOTE — Telephone Encounter (Signed)
[facility] Patient Advocate Encounter  Patient's medication Laura Jones ) have been couriered to [facility] from [facility] and will be administered on the patient next office visit on 01/08/22.  Clearance Coots , CPhT Specialty Pharmacy Patient [facility] Infect[facility] Phone: [phone] Fax:  [phone]

## 2021-12-27 DIAGNOSIS — I10 Essential (primary) hypertension: Secondary | ICD-10-CM | POA: Diagnosis not present

## 2021-12-27 DIAGNOSIS — K219 Gastro-esophageal reflux disease without esophagitis: Secondary | ICD-10-CM | POA: Diagnosis not present

## 2021-12-27 DIAGNOSIS — Z0000 Encounter for general adult medical examination without abnormal findings: Secondary | ICD-10-CM | POA: Diagnosis not present

## 2021-12-27 DIAGNOSIS — E782 Mixed hyperlipidemia: Secondary | ICD-10-CM | POA: Diagnosis not present

## 2022-01-08 ENCOUNTER — Other Ambulatory Visit: Payer: Self-pay

## 2022-01-08 ENCOUNTER — Ambulatory Visit (INDEPENDENT_AMBULATORY_CARE_PROVIDER_SITE_OTHER): Payer: Medicare Other | Admitting: Pharmacist

## 2022-01-08 DIAGNOSIS — Z21 Asymptomatic human immunodeficiency virus [HIV] infection status: Secondary | ICD-10-CM

## 2022-01-08 DIAGNOSIS — Z79899 Other long term (current) drug therapy: Secondary | ICD-10-CM | POA: Diagnosis not present

## 2022-01-08 MED ORDER — CABOTEGRAVIR & RILPIVIRINE ER 600 & 900 MG/3ML IM SUER
1.0000 | Freq: Once | INTRAMUSCULAR | Status: AC
  Administered 2022-01-08: 1 via INTRAMUSCULAR

## 2022-01-08 NOTE — Progress Notes (Signed)
HPI: Laura Jones is a 51 y.o. female who presents to the [place] clinic for [place] administration.  Patient Active Problem List   Diagnosis Date Noted   Elevated serum creatinine 05/30/2020   Anemia 05/30/2020   Hepatic cirrhosis ([place]) 05/27/2019   Closed bimalleolar fracture of left ankle 02/03/2019   VIN III (vulvar intraepithelial neoplasia III) 10/14/2018   OA (osteoarthritis) of hip 06/18/2018   Hypercholesteremia 12/08/2017   History of total right hip replacement 09/18/2017   Hepatitis C virus infection cured after antiviral drug therapy 07/30/2017   Acid reflux 07/30/2017   HIV (human immunodeficiency virus infection) ([place]) 07/29/2017   Cystocele, lateral 07/25/2016   Avascular necrosis of bone of hip, left ([place]) 09/13/2015   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   ATORVASTATIN (LIPITOR) 10 MG TABLET    Take 10 mg by mouth at bedtime.   BICTEGRAVIR-EMTRICITABINE-TENOFOVIR AF (BIKTARVY) 50-200-25 MG TABS TABLET    Take 1 tablet by mouth daily.   BUPROPION (ZYBAN) 150 MG 12 HR TABLET    Take 150 mg by mouth 2 (two) times daily.   BUSPIRONE (BUSPAR) 15 MG TABLET    Take 15 mg by mouth 2 (two) times daily.   CABOTEGRAVIR & RILPIVIRINE ER (CABENUVA) 600 & 900 MG/3ML INJECTION    Inject 1 kit into the muscle every 30 (thirty) days.   CABOTEGRAVIR & RILPIVIRINE ER (CABENUVA) 600 & 900 MG/3ML INJECTION    Inject 1 kit into the muscle every 2 (two) months.   CHOLECALCIFEROL (VITAMIN D) 50 MCG (2000 UT) TABLET    Take 4,000 Units by mouth daily.    CITALOPRAM (CELEXA) 40 MG TABLET    Take 40 mg by mouth daily.   DEXLANSOPRAZOLE (DEXILANT) 60 MG CAPSULE    Take 60 mg by mouth daily.   DICYCLOMINE (BENTYL) 10 MG CAPSULE    Take 10 mg by mouth 2 (two) times daily.     DIPHENHYDRAMINE-ACETAMINOPHEN (TYLENOL PM) 25-500 MG TABS TABLET    Take 1 tablet by mouth daily as needed.   DOCUSATE SODIUM (COLACE) 100 MG CAPSULE    Take 100 mg by mouth daily.    IBANDRONATE (BONIVA) 150 MG TABLET    Take 150 mg by mouth every 30 (thirty) days.   LOPERAMIDE (IMODIUM A-D) 2 MG TABLET    Take 2 mg by mouth as needed for diarrhea or loose stools.   METOPROLOL TARTRATE (LOPRESSOR) 25 MG TABLET    Take 25 mg by mouth daily.   ONDANSETRON (ZOFRAN-ODT) 8 MG DISINTEGRATING TABLET    TAKE 1 TABLET BY MOUTH EVERY 8 HOURS AS NEEDED FOR NAUSEA AND VOMITING   SENNA (SENOKOT) 8.6 MG TABLET    Take 1 tablet by mouth daily.    TIZANIDINE (ZANAFLEX) 4 MG TABLET    Take 4 mg by mouth at bedtime.   TRAZODONE (DESYREL) 100 MG TABLET    Take 100-200 mg by mouth at bedtime.   VITAMIN B-12 (CYANOCOBALAMIN) 500 MCG TABLET    Take 500 mcg by mouth daily. Two tablets at night  Modified Medications   No medications on file  Discontinued Medications   No medications on file    Allergies: Allergies  Allergen Reactions   Levaquin [Levofloxacin] Rash    Difficulty breathing, mouth swelling/rash  Fish Oil Nausea And Vomiting   Compazine [Prochlorperazine Edisylate]     Muscle twitching   Droperidol     uncontrolled muscle movements    Imitrex [Sumatriptan]     Difficulty breathing, chest pain; All "triptans"   Reglan [Metoclopramide]     Uncontrolled twitching   Sulfa Antibiotics Hives    Hives    Tagamet (Premixed [Cimetidine]    Thorazine [Chlorpromazine]     Uncontrolled muscle twitching    Past Medical History: Past Medical History:  Diagnosis Date   Anemia    hx of    Anxiety    Arthritis    left knee   Avascular necrosis of bone of hip, left (HCC)    Bulging lumbar disc    Depression    Dyspnea    with excertion   GERD (gastroesophageal reflux disease)    Hepatitis    C treated 2 years ago went undetected   History of kidney stones    HIV (human immunodeficiency  virus infection) ([place]) 07/29/2017   HIV infection (HCC)    Insomnia    Migraines    Nerve pain    Perimenopausal    Porphyria ([place])    PTSD (post-traumatic stress disorder)    Spinal stenosis of lumbosacral region    Tendonitis    right arm    Trauma     Social History: Social History   Socioeconomic History   Marital status: Divorced    Spouse name: Not on file   Number of children: Not on file   Years of education: Not on file   Highest education level: Not on file  Occupational History   Not on file  Tobacco Use   Smoking status: Never   Smokeless tobacco: Never  Vaping Use   Vaping Use: Never used  Substance and Sexual Activity   Alcohol use: No   Drug use: No   Sexual activity: Not Currently    Birth control/protection: Surgical    Comment: hyst  Other Topics Concern   Not on file  Social History Narrative   Not on file   Social Determinants of Health   Financial Resource Strain: Not on file  Food Insecurity: Not on file  Transportation Needs: Not on file  Physical Activity: Not on file  Stress: Not on file  Social Connections: Not on file    Labs: Lab Results  Component Value Date   HIV1RNAQUANT <20 12/29/2020   HIV1RNAQUANT 21 (H) 05/10/2020   HIV1RNAQUANT <20 NOT DETECTED 10/29/2019   CD4TABS 758 12/29/2020   CD4TABS 487 05/10/2020   CD4TABS 547 10/29/2019    RPR and STI Lab Results  Component Value Date   LABRPR NON-REACTIVE 05/10/2020    STI Results GC CT  05/10/2020 Negative Negative    Hepatitis B Lab Results  Component Value Date   HEPBSAB NON-REACTIVE 07/29/2017   HEPBSAG NON-REACTIVE 07/29/2017   Hepatitis C No results found for: HEPCAB, HCVRNAPCRQN Hepatitis A Lab Results  Component Value Date   HAV REACTIVE (A) 07/29/2017   Lipids: Lab Results  Component Value Date   CHOL 173 05/11/2019   TRIG 198 (H) 05/11/2019   HDL 52 05/11/2019   CHOLHDL 3.3 05/11/2019   VLDL 65 (H) 07/29/2017   LDLCALC 91 05/11/2019     TARGET DATE: The 14th of the month  Assessment: [name] presents today for their maintenance Cabenuva injections. Initial/past injections were tolerated well without issues. No problems with systemic side effects of injections. Patient did experience  some fatigue and moderate pain/soreness around the injection site that went away after one day. Will check HIV RNA today and LFTs due to history of hepatitis C.  Administered cabotegravir 681m/3mL in left upper outer quadrant of the gluteal muscle. Administered rilpivirine 900 mg/350min the right upper outer quadrant of the gluteal muscle. Monitored patient for 10 minutes after injection. Injections were tolerated well without issue. Patient will follow up in 2 months for next set of injections.  Plan: - Cabenuva injections administered - Check HIV RNA and CMP - Next injections scheduled for 03/06/22 with [facility] [place] 05/07/22 with Cassie - Call with any issues or questions  YuMichela PitcherChSmith VillagePharmD Student

## 2022-01-09 NOTE — Progress Notes (Signed)
LFTs look good

## 2022-01-10 LAB — COMPREHENSIVE METABOLIC PANEL
AG Ratio: 1.5 (calc) (ref 1.0–2.5)
ALT: 10 U/L (ref 6–29)
AST: 14 U/L (ref 10–35)
Albumin: 4.4 g/dL (ref 3.6–5.1)
Alkaline phosphatase (APISO): 51 U/L (ref 37–153)
BUN/Creatinine Ratio: 8 (calc) (ref 6–22)
BUN: 9 mg/dL (ref 7–25)
CO2: 30 mmol/L (ref 20–32)
Calcium: 10.1 mg/dL (ref 8.6–10.4)
Chloride: 104 mmol/L (ref 98–110)
Creat: 1.06 mg/dL — ABNORMAL HIGH (ref 0.50–1.03)
Globulin: 3 g/dL (calc) (ref 1.9–3.7)
Glucose, Bld: 90 mg/dL (ref 65–99)
Potassium: 3.7 mmol/L (ref 3.5–5.3)
Sodium: 142 mmol/L (ref 135–146)
Total Bilirubin: 0.2 mg/dL (ref 0.2–1.2)
Total Protein: 7.4 g/dL (ref 6.1–8.1)

## 2022-01-10 LAB — HIV-1 RNA QUANT-NO REFLEX-BLD
HIV 1 RNA Quant: <20 Copies/mL — ABNORMAL HIGH
HIV-1 RNA Quant, Log: <1.3 Log cps/mL — ABNORMAL HIGH

## 2022-01-15 ENCOUNTER — Telehealth: Payer: Self-pay

## 2022-01-15 NOTE — Telephone Encounter (Signed)
Patient left a voicemail requesting call back regarding labs. Called patient and updated her that HIV was undetectable and liver function looked good, patient voiced understanding.

## 2022-01-16 ENCOUNTER — Other Ambulatory Visit (HOSPITAL_COMMUNITY): Payer: Self-pay

## 2022-01-17 ENCOUNTER — Other Ambulatory Visit (HOSPITAL_COMMUNITY): Payer: Self-pay

## 2022-01-19 ENCOUNTER — Telehealth: Payer: Self-pay

## 2022-01-19 NOTE — Telephone Encounter (Signed)
[facility] Patient Advocate Encounter ° °Patient's medication (Cabenuva) have been couriered to [facility] from [facility] and will be administered on patient next office visit on 03/06/22. ° °Haja Crego , CPhT °Specialty Pharmacy Patient Advocate °[facility] °Phone: [phone] °Fax:  [phone]  °

## 2022-01-22 ENCOUNTER — Other Ambulatory Visit (HOSPITAL_COMMUNITY): Payer: Self-pay

## 2022-01-30 ENCOUNTER — Other Ambulatory Visit (HOSPITAL_COMMUNITY): Payer: Self-pay

## 2022-02-05 ENCOUNTER — Other Ambulatory Visit (HOSPITAL_COMMUNITY): Payer: Self-pay

## 2022-02-26 ENCOUNTER — Ambulatory Visit: Payer: Medicare Other | Admitting: Infectious Diseases

## 2022-03-05 ENCOUNTER — Other Ambulatory Visit (HOSPITAL_COMMUNITY): Payer: Self-pay

## 2022-03-06 ENCOUNTER — Ambulatory Visit (INDEPENDENT_AMBULATORY_CARE_PROVIDER_SITE_OTHER): Payer: Medicare Other | Admitting: Infectious Diseases

## 2022-03-06 ENCOUNTER — Other Ambulatory Visit: Payer: Self-pay

## 2022-03-06 ENCOUNTER — Encounter: Payer: Self-pay | Admitting: Infectious Diseases

## 2022-03-06 ENCOUNTER — Other Ambulatory Visit (HOSPITAL_COMMUNITY): Payer: Self-pay

## 2022-03-06 VITALS — BP 82/58 | HR 63

## 2022-03-06 DIAGNOSIS — Z8619 Personal history of other infectious and parasitic diseases: Secondary | ICD-10-CM | POA: Diagnosis not present

## 2022-03-06 DIAGNOSIS — I952 Hypotension due to drugs: Secondary | ICD-10-CM

## 2022-03-06 DIAGNOSIS — N761 Subacute and chronic vaginitis: Secondary | ICD-10-CM

## 2022-03-06 DIAGNOSIS — Z21 Asymptomatic human immunodeficiency virus [HIV] infection status: Secondary | ICD-10-CM | POA: Diagnosis not present

## 2022-03-06 DIAGNOSIS — N760 Acute vaginitis: Secondary | ICD-10-CM | POA: Insufficient documentation

## 2022-03-06 DIAGNOSIS — K7460 Unspecified cirrhosis of liver: Secondary | ICD-10-CM

## 2022-03-06 DIAGNOSIS — I959 Hypotension, unspecified: Secondary | ICD-10-CM | POA: Insufficient documentation

## 2022-03-06 MED ORDER — CABOTEGRAVIR & RILPIVIRINE ER 600 & 900 MG/3ML IM SUER
1.0000 | Freq: Once | INTRAMUSCULAR | Status: AC
  Administered 2022-03-06: 1 via INTRAMUSCULAR

## 2022-03-06 NOTE — Assessment & Plan Note (Signed)
BP Readings from Last 3 Encounters:  ?03/06/22 (!) 82/58  ?08/15/21 91/61  ?02/10/21 106/64  ? ?Hypotensive with fall today after Metoprolol dosing. She had no injury or LOC. No need for ER evaluation. I asked her to hold her PM doses and check her BP this evening and again in AM. I have asked her to please coordinate tele-communication/visit with PCP to inform them of fall and low blood pressure. May need reduction in Metoprolol dose. She is comfortable with this plan and has someone who drove her to clinic today.  ?

## 2022-03-06 NOTE — Patient Instructions (Addendum)
Don't take your blood pressure pill this evening (Amlodipine).  ? ?Your blood pressure today was too low - I would like to see the top number > 100 for you and today yours was only 82.  ? ?When your blood pressure is low like this you are at higher risk for dizziness, falls especially when you change position quickly. I would recommend calling to talk with your PCP about your dizziness/fall and blood pressure today for more instructions. You may not need all the blood pressure medications and could come down on some of the doses.  ? ? ?Boric Acid vaginal suppositories may be helpful for you. We can try to get these compounded for you at the [facility] if you have trouble finding them online. Use one application nightly for 10-14 days for vaginal itching/drainage.  ? ?Your next medication visit with our pharmacy team is scheduled on 05/07/2022 ? ?I will plan to see you back for your September or November injection. Will plan to get your next ultrasound done sometime in August at [facility].  ?

## 2022-03-06 NOTE — Assessment & Plan Note (Signed)
Suspect given her refractory symptoms to fluconazole she may have azole-resistant yeast like c.glabratta.  ?We discussed OTC boric acid nightly applications x 10-14d to treat this. We can help compound these for her through [facility] and have them mailed to her if she has trouble finding them.  ?

## 2022-03-06 NOTE — Progress Notes (Signed)
? ?Patient Name: Laura Jones  ?Date of Birth: Jun 20, 1971 ?MRN: [id number]  ?PCP: Toma Deiters, MD  ? ? ?SUBJECTIVE:  ?Brief Narrative:   ?Jerianne is a 51 y.o. female with HIV disease, dx "a long time ago." Relocated from [place] in 2018 where she was in care with [facility].  ?History of Hep C, cured (s/p treatment x 2, HCV Quant < 15, 07/29/2017).   ?+Cirrhosis per notes from previous liver biopsies  ?S/P hysterectomy Hep B sAg (-), Hep B sAb (-) following several attempts to vaccinate.  ?History of OIs: none known.  ?HIV Risk: heterosexual contact ? ?Previous Regimens:  ?Prezista + Norvir + Truvada  ?Genvoya >> suppressed  ?Biktarvy 09/2018 >> switched d/t DDI with medications ? ?Genotype:  ?K103N - NNRTI resistance per chart records  ? ? ?Patient Active Problem List  ? Diagnosis Date Noted  ? Vaginitis 03/06/2022  ? Hypotension 03/06/2022  ? Elevated serum creatinine 05/30/2020  ? Anemia 05/30/2020  ? Hepatic cirrhosis (HCC) 05/27/2019  ? Closed bimalleolar fracture of left ankle 02/03/2019  ? VIN III (vulvar intraepithelial neoplasia III) 10/14/2018  ? OA (osteoarthritis) of hip 06/18/2018  ? Hypercholesteremia 12/08/2017  ? History of total right hip replacement 09/18/2017  ? Hepatitis C virus infection cured after antiviral drug therapy 07/30/2017  ? Acid reflux 07/30/2017  ? HIV (human immunodeficiency virus infection) (HCC) 07/29/2017  ? Cystocele, lateral 07/25/2016  ? Avascular necrosis of bone of hip, left (HCC) 09/13/2015  ? 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 Depression 07/01/2015  ? ? ?No chief complaint on file. ?  ? ?HPI: ?Here for routine follow up HIV and cabenuva injection visit.  ? ?She passed out today in the lobby, observed and w/o injury. She is not on anticoagulation. She did not have LOC. She says she gets these spells from time to time. She is taking Metoprolol BID 25 mg and Amlodipine 5 mg QHS.  She has a follow up with her PCP in 3 weeks.  She has not  felt well today - no specific physical symptoms aside from fatigue.  ? ?She is doing well with cabenuva - just notices some increased fatigue for the first few days after her dose but nothing that lasts a long time.  ? ?Asking about OTC recommendations for yeast infection - had to one time get gentian violet application with GYN for what appeared to be fluconazole refractory vaginitis - this worked very well but was quite messy.  ? ? ?Review of Systems  ?Constitutional:  Positive for fatigue. Negative for appetite change, chills, fever and unexpected weight change.  ?HENT:  Negative for mouth sores, sore throat and trouble swallowing.   ?Eyes:  Negative for pain and visual disturbance.  ?Respiratory:  Positive for cough. Negative for shortness of breath.   ?Cardiovascular:  Negative for chest pain.  ?Gastrointestinal:  Negative for abdominal pain, diarrhea and nausea.  ?Genitourinary:  Negative for dysuria, menstrual problem and pelvic pain.  ?Musculoskeletal:  Negative for back pain and neck pain.  ?Skin:  Negative for color change and rash.  ?Neurological:  Negative for weakness, numbness and headaches.  ?Hematological:  Negative for adenopathy.  ?Psychiatric/Behavioral:  Negative for dysphoric mood. The patient is not nervous/anxious.   ?d ? ?Past Medical History:  ?Diagnosis Date  ? Anemia   ? hx of   ? Anxiety   ? Arthritis   ? left knee  ? Avascular necrosis of bone of hip, left (HCC)   ?  Bulging lumbar disc   ? Depression   ? Dyspnea   ? with excertion  ? GERD (gastroesophageal reflux disease)   ? Hepatitis   ? C treated 2 years ago went undetected  ? History of kidney stones   ? HIV (human immunodeficiency virus infection) (HCC) 07/29/2017  ? HIV infection (HCC)   ? Insomnia   ? Migraines   ? Nerve pain   ? Perimenopausal   ? Porphyria (HCC)   ? PTSD (post-traumatic stress disorder)   ? Spinal stenosis of lumbosacral region   ? Tendonitis   ? right arm   ? Trauma   ? ? ?Social History  ? ?Tobacco Use  ?  Smoking status: Never  ? Smokeless tobacco: Never  ?Vaping Use  ? Vaping Use: Never used  ?Substance Use Topics  ? Alcohol use: No  ? Drug use: No  ? ? ? ?Allergies  ?Allergen Reactions  ? Levaquin [Levofloxacin] Rash  ?  Difficulty breathing, mouth swelling/rash  ? Fish Oil Nausea And Vomiting  ? Compazine [Prochlorperazine Edisylate]   ?  Muscle twitching  ? Droperidol   ?  uncontrolled muscle movements   ? Imitrex [Sumatriptan]   ?  Difficulty breathing, chest pain; All "triptans"  ? Reglan [Metoclopramide]   ?  Uncontrolled twitching  ? Sulfa Antibiotics Hives  ?  Hives ?  ? Tagamet (Premixed [Cimetidine]   ? Thorazine [Chlorpromazine]   ?  Uncontrolled muscle twitching  ? ? ?Objective:  ?Vitals:  ? 03/06/22 1119  ?BP: (!) 82/58  ?Pulse: 63  ?SpO2: 96%  ? ?There is no height or weight on file to calculate BMI. ? ?Physical Exam ?Constitutional:   ?   Appearance: Normal appearance. She is not ill-appearing.  ?HENT:  ?   Mouth/Throat:  ?   Mouth: Mucous membranes are moist.  ?   Pharynx: Oropharynx is clear.  ?Eyes:  ?   General: No scleral icterus. ?Cardiovascular:  ?   Rate and Rhythm: Normal rate and regular rhythm.  ?Pulmonary:  ?   Effort: Pulmonary effort is normal.  ?Neurological:  ?   Mental Status: She is oriented to person, place, and time.  ?Psychiatric:     ?   Mood and Affect: Mood normal.     ?   Thought Content: Thought content normal.  ? ? ?Lab Results ?HIV 1 RNA Quant  ?Date Value  ?01/08/2022 <20 Copies/mL (H)  ?12/29/2020 <20 Copies/mL  ?05/10/2020 21 copies/mL (H)  ? ?CD4 T Cell Abs (/uL)  ?Date Value  ?12/29/2020 758  ?05/10/2020 487  ?10/29/2019 547  ? ?Lab Results  ?Component Value Date  ? ALT 10 01/08/2022  ? AST 14 01/08/2022  ? ALKPHOS 95 12/19/2019  ? BILITOT 0.2 01/08/2022  ? ?Lab Results  ?Component Value Date  ? CREATININE 1.06 (H) 01/08/2022  ? CREATININE 1.14 (H) 05/10/2020  ? CREATININE 1.03 (H) 12/19/2019  ? ? ? ? ? ?ASSESSMENT & PLAN:  ? ?Problem List Items Addressed This Visit    ? ?  ? Unprioritized  ? HIV (human immunodeficiency virus infection) (HCC) - Primary (Chronic)  ?  Doing well with cabenuva injections. VLs remain < 20. LFTs tolerating well per last check in January 2023 - will continue q30m checks for now with H/O cirrhosis.  ?CD4 count due today.  ?Declined STI screening need.  ?FU with GYN for VIN history.  ?Also working with PCP for other preventative/wellness care.  ? ?FU with pharmacy for next few  injections and I will see her back in September/November injection visit.  ?  ?  ? Relevant Orders  ? HIV-1 RNA quant-no reflex-bld  ? T-helper cells (CD4) [facility] ([facility]  ? Hepatic cirrhosis (HCC)  ? Relevant Orders  ? US ABDOMEN LIMITED RUQ (LIVER/GB)  ? Hepatitis C virus infection cured after antiviral drug therapy  ?  [facility] last without any acute changes and stable steatosis w/o mass effect.  ?  ?  ? Relevant Orders  ? US ABDOMEN LIMITED RUQ (LIVER/GB)  ? Vaginitis  ?  Suspect given her refractory symptoms to fluconazole she may have azole-resistant yeast like c.glabratta.  ?We discussed OTC boric acid nightly applications x 10-14d to treat this. We can help compound these for her through [facility] and have them mailed to her if she has trouble finding them.  ?  ?  ? Hypotension  ?  BP Readings from Last 3 Encounters:  ?03/06/22 (!) 82/58  ?08/15/21 91/61  ?02/10/21 106/64  ?Hypotensive with fall today after Metoprolol dosing. She had no injury or LOC. No need for ER evaluation. I asked her to hold her PM doses and check her BP this evening and again in AM. I have asked her to please coordinate tele-communication/visit with PCP to inform them of fall and low blood pressure. May need reduction in Metoprolol dose. She is comfortable with this plan and has someone who drove her to clinic today.  ?  ?  ? ?Rexene AlbertsStephanie Antoinetta Berrones, MSN, NP-C ?[facility] ?[facility]   ?[email or web address] ?Pager: [phone] ?Office: [phone] ?[facility] Main Line: [phone]  ? ?

## 2022-03-06 NOTE — Addendum Note (Signed)
Addended by: Clayborne Artist A on: 03/06/2022 12:15 PM ? ? Modules accepted: Orders ? ?

## 2022-03-06 NOTE — Assessment & Plan Note (Signed)
FU with liver ultrasound in August 2023 for [facility] screening - last without any acute changes and stable steatosis w/o mass effect.  ?

## 2022-03-06 NOTE — Assessment & Plan Note (Signed)
Doing well with cabenuva injections. VLs remain < 20. LFTs tolerating well per last check in January 2023 - will continue q80m checks for now with H/O cirrhosis.  ?CD4 count due today.  ?Declined STI screening need.  ?FU with GYN for VIN history.  ?Also working with PCP for other preventative/wellness care.  ? ?FU with pharmacy for next few injections and I will see her back in September/November injection visit.  ?

## 2022-03-08 LAB — T-HELPER CELLS (CD4) COUNT (NOT AT ARMC)
Absolute CD4: 397 cells/uL — ABNORMAL LOW (ref 490–1740)
CD4 T Helper %: 33 % (ref 30–61)
Total lymphocyte count: 1207 cells/uL (ref 850–3900)

## 2022-03-08 LAB — HIV-1 RNA QUANT-NO REFLEX-BLD
HIV 1 RNA Quant: 27 Copies/mL — ABNORMAL HIGH
HIV-1 RNA Quant, Log: 1.43 Log cps/mL — ABNORMAL HIGH

## 2022-03-28 DIAGNOSIS — J45909 Unspecified asthma, uncomplicated: Secondary | ICD-10-CM | POA: Diagnosis not present

## 2022-03-28 DIAGNOSIS — E782 Mixed hyperlipidemia: Secondary | ICD-10-CM | POA: Diagnosis not present

## 2022-03-28 DIAGNOSIS — K219 Gastro-esophageal reflux disease without esophagitis: Secondary | ICD-10-CM | POA: Diagnosis not present

## 2022-03-28 DIAGNOSIS — I10 Essential (primary) hypertension: Secondary | ICD-10-CM | POA: Diagnosis not present

## 2022-04-11 MED ORDER — ONDANSETRON 8 MG PO TBDP: ORAL_TABLET | ORAL | 2 refills | Status: DC

## 2022-04-25 ENCOUNTER — Other Ambulatory Visit (HOSPITAL_COMMUNITY): Payer: Self-pay

## 2022-04-27 ENCOUNTER — Other Ambulatory Visit (HOSPITAL_COMMUNITY): Payer: Self-pay

## 2022-05-07 ENCOUNTER — Encounter: Payer: Medicare Other | Admitting: Pharmacist

## 2022-05-09 ENCOUNTER — Other Ambulatory Visit: Payer: Self-pay

## 2022-05-09 ENCOUNTER — Ambulatory Visit (INDEPENDENT_AMBULATORY_CARE_PROVIDER_SITE_OTHER): Payer: Medicare Other | Admitting: Pharmacist

## 2022-05-09 DIAGNOSIS — Z21 Asymptomatic human immunodeficiency virus [HIV] infection status: Secondary | ICD-10-CM | POA: Diagnosis not present

## 2022-05-09 MED ORDER — CABOTEGRAVIR & RILPIVIRINE ER 600 & 900 MG/3ML IM SUER
1.0000 | Freq: Once | INTRAMUSCULAR | Status: AC
  Administered 2022-05-09: 1 via INTRAMUSCULAR

## 2022-05-09 NOTE — Progress Notes (Signed)
HPI: Laura Jones is a 51 y.o. female who presents to the [facility] clinic for [place] administration.  Patient Active Problem List   Diagnosis Date Noted   Vaginitis 03/06/2022   Hypotension 03/06/2022   Elevated serum creatinine 05/30/2020   Anemia 05/30/2020   Hepatic cirrhosis ([place]) 05/27/2019   Closed bimalleolar fracture of left ankle 02/03/2019   VIN III (vulvar intraepithelial neoplasia III) 10/14/2018   OA (osteoarthritis) of hip 06/18/2018   Hypercholesteremia 12/08/2017   History of total right hip replacement 09/18/2017   Hepatitis C virus infection cured after antiviral drug therapy 07/30/2017   Acid reflux 07/30/2017   HIV (human immunodeficiency virus infection) ([place]) 07/29/2017   Cystocele, lateral 07/25/2016   Avascular necrosis of bone of hip, left ([place]) 09/13/2015   Erythropoietic porphyria ([place]) 09/13/2015   Depression 07/01/2015    Patient's Medications  New Prescriptions   No medications on file  Previous Medications   AMLODIPINE (NORVASC) 5 MG TABLET    Take 5 mg by mouth daily.   ATORVASTATIN (LIPITOR) 10 MG TABLET    Take 10 mg by mouth at bedtime.   BUPROPION (ZYBAN) 150 MG 12 HR TABLET    Take 150 mg by mouth 2 (two) times daily.   BUSPIRONE (BUSPAR) 15 MG TABLET    Take 15 mg by mouth 2 (two) times daily.   CABOTEGRAVIR & RILPIVIRINE ER (CABENUVA) 600 & 900 MG/3ML INJECTION    Inject 1 kit into the muscle every 2 (two) months.   CHOLECALCIFEROL (VITAMIN D) 50 MCG (2000 UT) TABLET    Take 4,000 Units by mouth daily.    CITALOPRAM (CELEXA) 40 MG TABLET    Take 40 mg by mouth daily.   DEXLANSOPRAZOLE (DEXILANT) 60 MG CAPSULE    Take 60 mg by mouth daily.   DICYCLOMINE (BENTYL) 10 MG CAPSULE    Take 10 mg by mouth 2 (two) times daily.    DIPHENHYDRAMINE-ACETAMINOPHEN (TYLENOL PM) 25-500 MG TABS TABLET    Take 1 tablet by mouth daily as needed.   DOCUSATE SODIUM (COLACE) 100 MG CAPSULE    Take 100 mg by mouth daily.    IBANDRONATE (BONIVA)  150 MG TABLET    Take 150 mg by mouth every 30 (thirty) days.   LOPERAMIDE (IMODIUM A-D) 2 MG TABLET    Take 2 mg by mouth as needed for diarrhea or loose stools.   METOPROLOL TARTRATE (LOPRESSOR) 25 MG TABLET    Take 25 mg by mouth daily.   ONDANSETRON (ZOFRAN-ODT) 8 MG DISINTEGRATING TABLET    TAKE 1 TABLET BY MOUTH EVERY 8 HOURS AS NEEDED FOR NAUSEA AND VOMITING   SENNA (SENOKOT) 8.6 MG TABLET    Take 1 tablet by mouth daily.    TIZANIDINE (ZANAFLEX) 4 MG TABLET    Take 4 mg by mouth at bedtime.   TRAZODONE (DESYREL) 100 MG TABLET    Take 100-200 mg by mouth at bedtime.   VITAMIN B-12 (CYANOCOBALAMIN) 500 MCG TABLET    Take 500 mcg by mouth daily. Two tablets at night  Modified Medications   No medications on file  Discontinued Medications   No medications on file    Allergies: Allergies  Allergen Reactions   Levaquin [Levofloxacin] Rash    Difficulty breathing, mouth swelling/rash   Fish Oil Nausea And Vomiting   Compazine [Prochlorperazine Edisylate]     Muscle twitching   Droperidol     uncontrolled muscle movements    Imitrex [Sumatriptan]  Difficulty breathing, chest pain; All "triptans"   Reglan [Metoclopramide]     Uncontrolled twitching   Sulfa Antibiotics Hives    Hives    Tagamet (Premixed [Cimetidine]    Thorazine [Chlorpromazine]     Uncontrolled muscle twitching    Past Medical History: Past Medical History:  Diagnosis Date   Anemia    hx of    Anxiety    Arthritis    left knee   Avascular necrosis of bone of hip, left (HCC)    Bulging lumbar disc    Depression    Dyspnea    with excertion   GERD (gastroesophageal reflux disease)    Hepatitis    C treated 2 years ago went undetected   History of kidney stones    HIV (human immunodeficiency virus infection) ([place]) 07/29/2017   HIV infection (HCC)    Insomnia    Migraines    Nerve pain    Perimenopausal    Porphyria ([place])    PTSD (post-traumatic stress disorder)    Spinal stenosis of  lumbosacral region    Tendonitis    right arm    Trauma     Social History: Social History   Socioeconomic History   Marital status: Divorced    Spouse name: Not on file   Number of children: Not on file   Years of education: Not on file   Highest education level: Not on file  Occupational History   Not on file  Tobacco Use   Smoking status: Never   Smokeless tobacco: Never  Vaping Use   Vaping Use: Never used  Substance and Sexual Activity   Alcohol use: No   Drug use: No   Sexual activity: Not Currently    Birth control/protection: Surgical    Comment: hyst  Other Topics Concern   Not on file  Social History Narrative   Not on file   Social Determinants of Health   Financial Resource Strain: Not on file  Food Insecurity: Not on file  Transportation Needs: Not on file  Physical Activity: Not on file  Stress: Not on file  Social Connections: Not on file    Labs: Lab Results  Component Value Date   HIV1RNAQUANT 27 (H) 03/06/2022   HIV1RNAQUANT <20 (H) 01/08/2022   HIV1RNAQUANT <20 12/29/2020   CD4TABS 758 12/29/2020   CD4TABS 487 05/10/2020   CD4TABS 547 10/29/2019    RPR and STI Lab Results  Component Value Date   LABRPR NON-REACTIVE 05/10/2020    STI Results GC CT  05/10/2020 11:08 AM Negative   Negative      Hepatitis B Lab Results  Component Value Date   HEPBSAB NON-REACTIVE 07/29/2017   HEPBSAG NON-REACTIVE 07/29/2017   Hepatitis C No results found for: HEPCAB, HCVRNAPCRQN Hepatitis A Lab Results  Component Value Date   HAV REACTIVE (A) 07/29/2017   Lipids: Lab Results  Component Value Date   CHOL 173 05/11/2019   TRIG 198 (H) 05/11/2019   HDL 52 05/11/2019   CHOLHDL 3.3 05/11/2019   VLDL 65 (H) 07/29/2017   LDLCALC 91 05/11/2019    TARGET DATE: The 14th  Assessment: Thomasina presents today for her maintenance Cabenuva injections. Past injections were tolerated well without issues.   Administered cabotegravir 670m/3mL  in left upper outer quadrant of the gluteal muscle. Administered rilpivirine 900 mg/332min the right upper outer quadrant of the gluteal muscle. Injections were tolerated well. Patient will follow up in 2 months for next set of injections.  Plan: - Cabenuva injections administered - HIV RNA today - Next injections scheduled for 07/05/22 with me Colletta Maryland was unavailable during injection window) and 09/10/22 with [facility] - Call with any issues or questions  Trude Cansler L. Renne Platts, PharmD, BCIDP, AAHIVP, [place] Clinical Pharmacist Practitioner [facility]

## 2022-05-14 LAB — HIV-1 RNA QUANT-NO REFLEX-BLD
HIV 1 RNA Quant: 38 copies/mL — ABNORMAL HIGH
HIV-1 RNA Quant, Log: 1.58 Log copies/mL — ABNORMAL HIGH

## 2022-05-25 IMAGING — US US ABDOMEN LIMITED
1 series · 14 of 25 positions shown · non-contrast
Comparison: Abdominal CT 09/25/2020, abdominal ultrasound
02/03/2020

CLINICAL DATA: Chronic hepatitis C. 8 cc/cirrhosis screening.

EXAM:
ULTRASOUND ABDOMEN LIMITED RIGHT UPPER QUADRANT

[Series 1: us abdomen limited · 0.13mm/px · 14 of 106 slices shown]
[im 1/106]
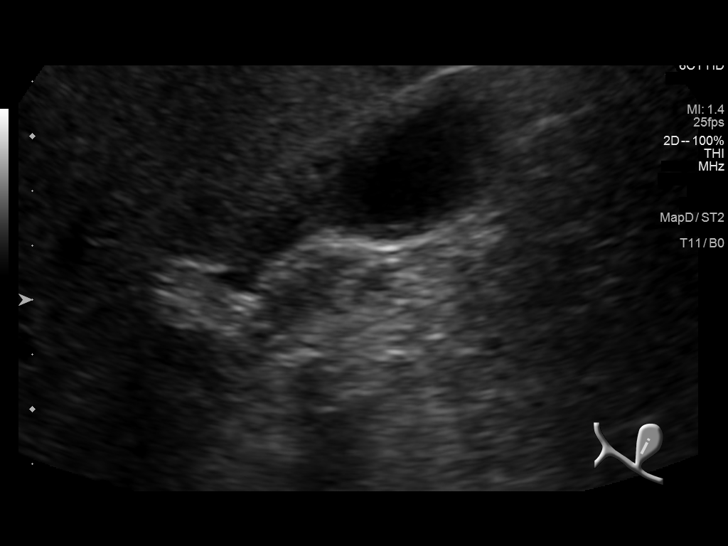
[im 9/106]
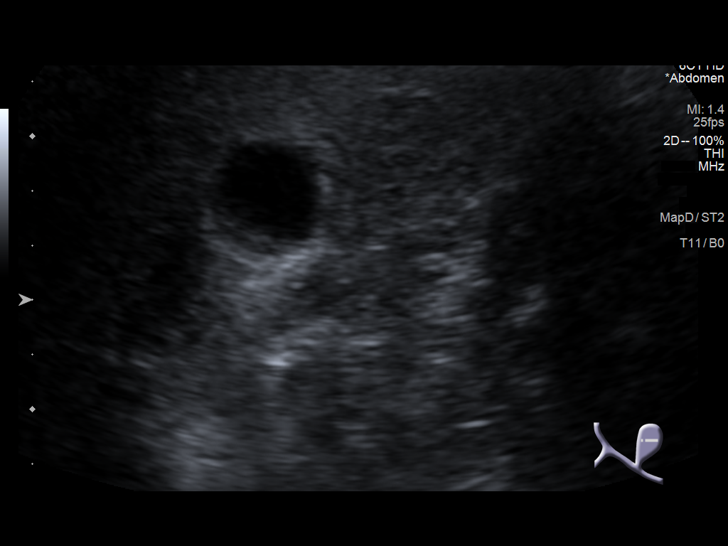
[im 18/106]
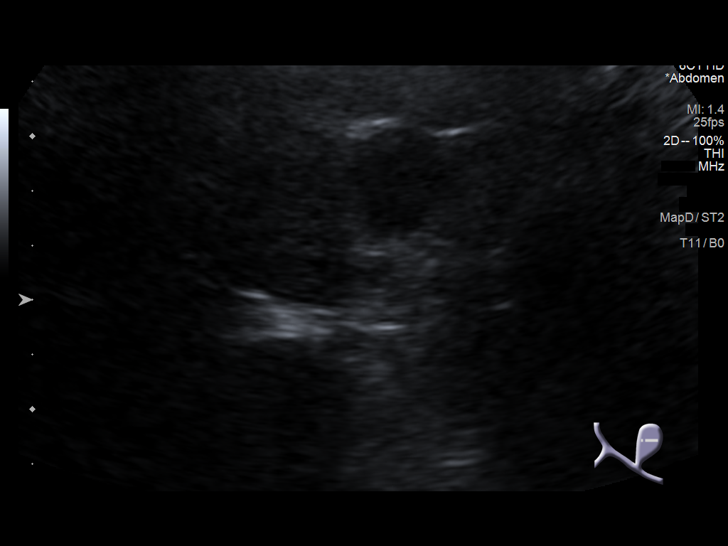
[im 27/106]
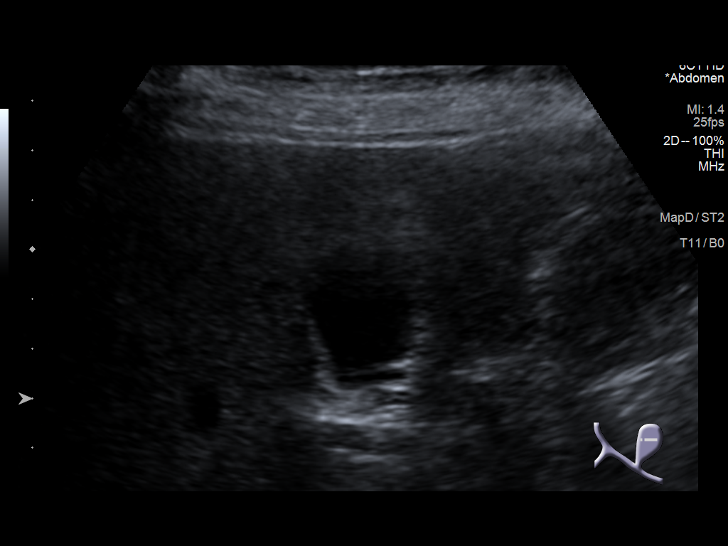
[im 36/106]
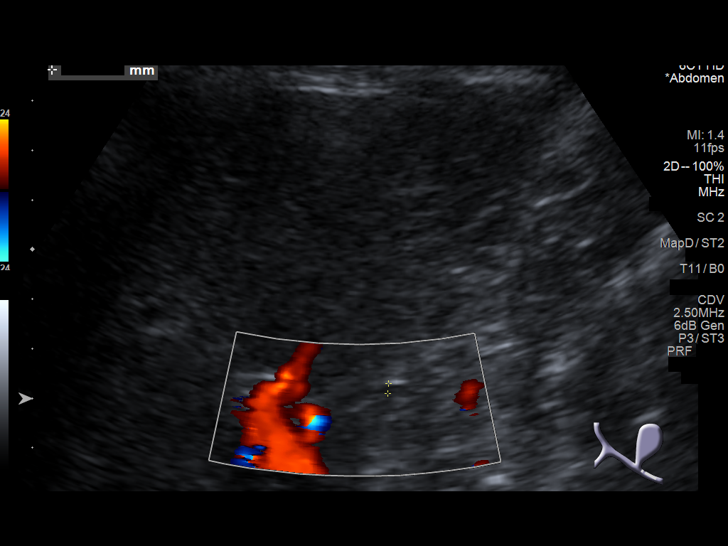
[im 40/106]
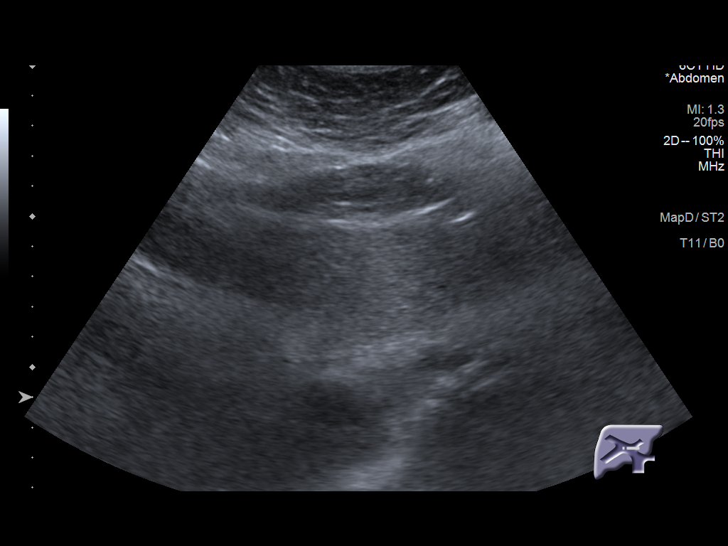
[im 49/106]
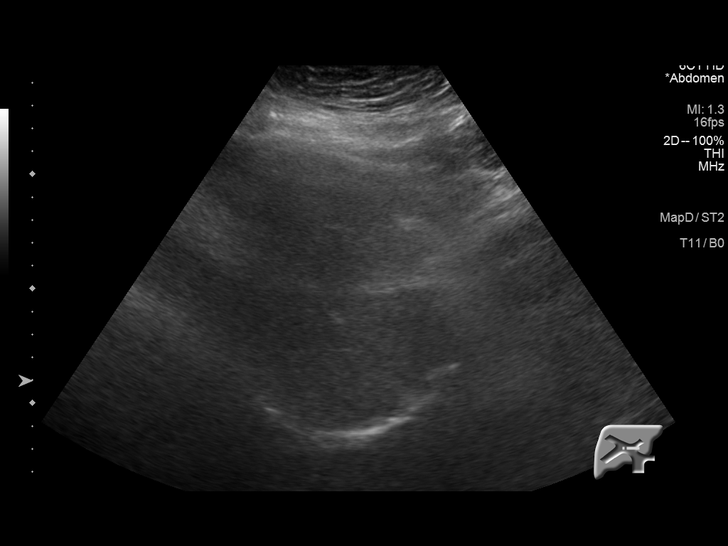
[im 57/106]
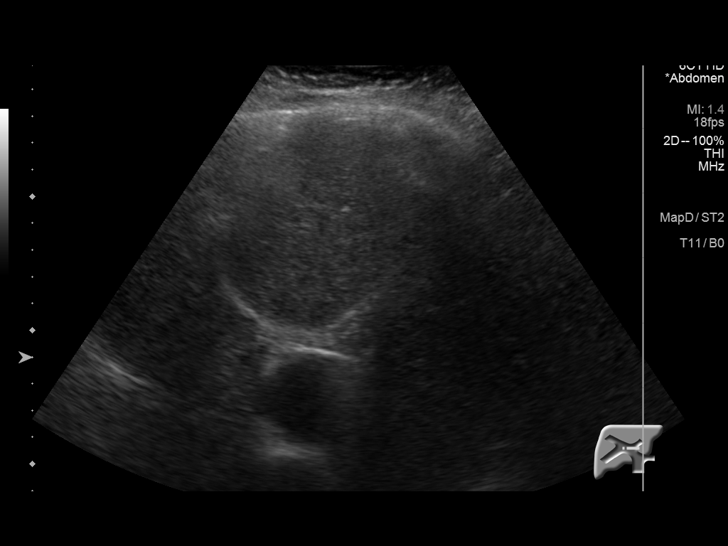
[im 66/106]
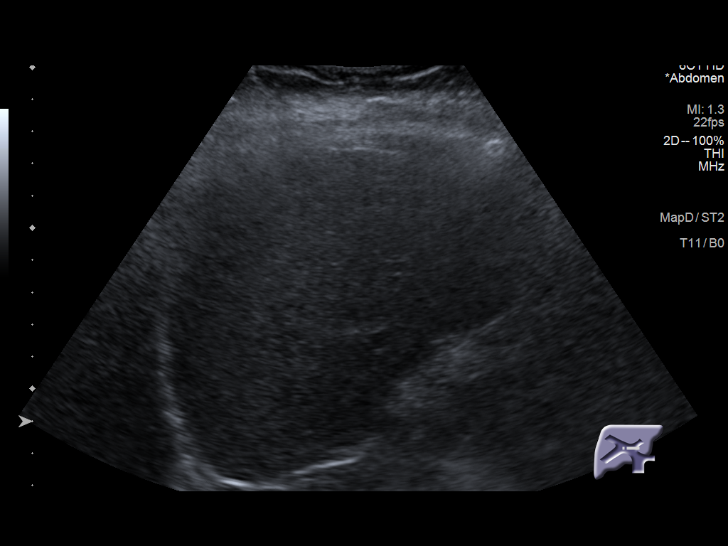
[im 71/106]
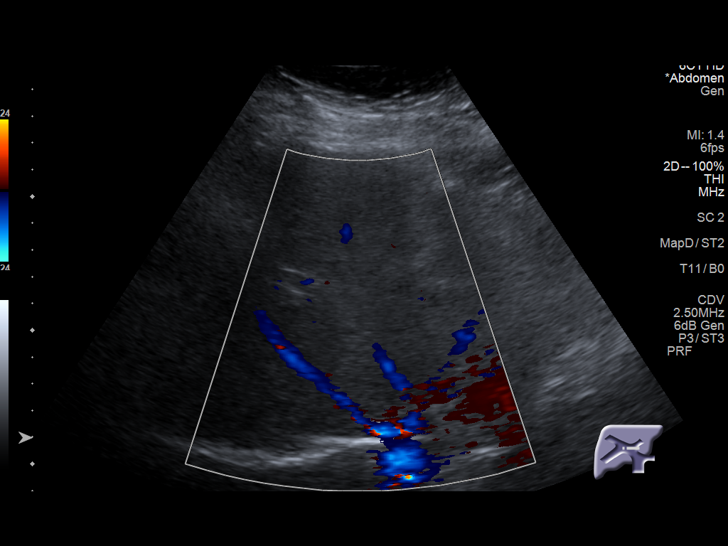
[im 79/106]
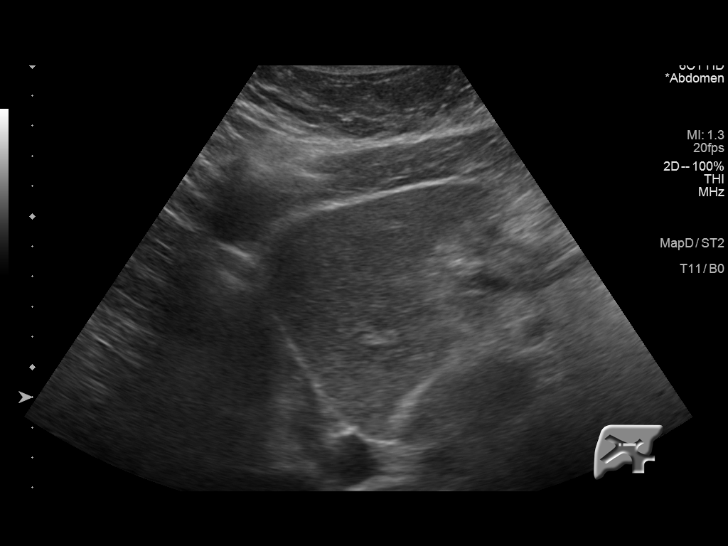
[im 88/106]
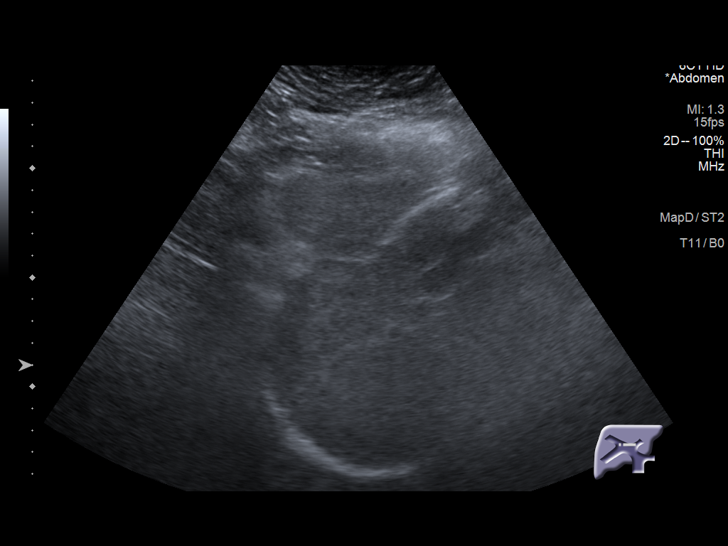
[im 97/106]
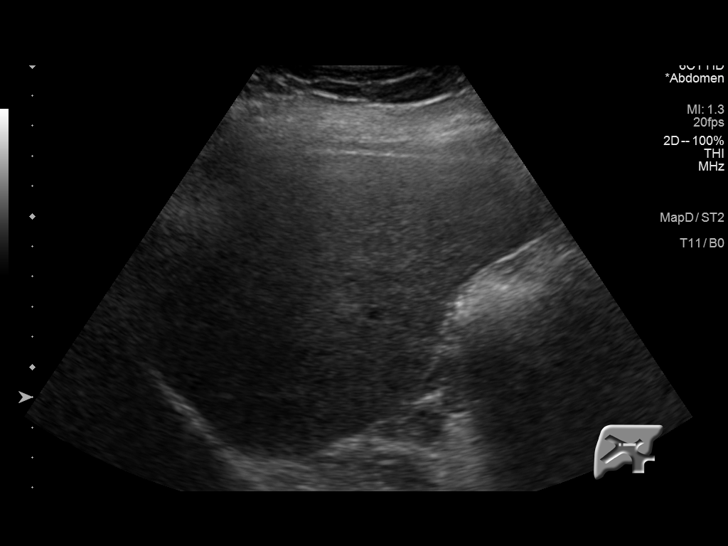
[im 106/106]
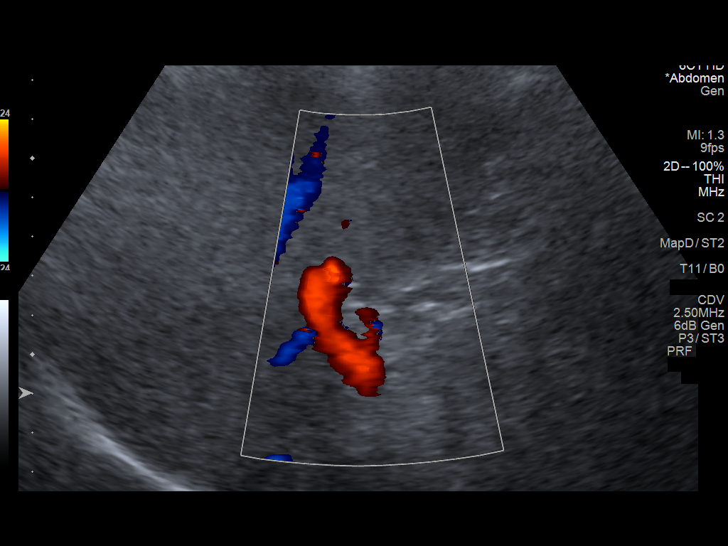

[14 of 25 positions shown; findings below may reference images not displayed]

FINDINGS: Gallbladder:

Physiologically distended. No gallstones or wall thickening
visualized. No sonographic Murphy sign noted by sonographer.

Common bile duct:

Diameter: 2 mm, normal.

Liver:

Heterogeneous, coarsened, and slightly increased parenchymal
echogenicity. No discrete focal lesion. There is no capsular
nodularity. Portal vein is patent on color Doppler imaging with
normal direction of blood flow towards the liver.

Other: No right upper quadrant ascites.
IMPRESSION: 1. Coarsened in slightly increased hepatic parenchymal echogenicity
a can be seen with steatosis or other hepatocellular disease such as
cirrhosis. No capsular nodularity or discrete lesion.
2. Normal sonographic appearance of the gallbladder and biliary
tree.

## 2022-05-31 DIAGNOSIS — R221 Localized swelling, mass and lump, neck: Secondary | ICD-10-CM | POA: Diagnosis not present

## 2022-05-31 DIAGNOSIS — B379 Candidiasis, unspecified: Secondary | ICD-10-CM | POA: Diagnosis not present

## 2022-06-11 ENCOUNTER — Other Ambulatory Visit: Payer: Self-pay | Admitting: Infectious Diseases

## 2022-06-11 NOTE — Telephone Encounter (Signed)
Refills provided for zofran

## 2022-06-11 NOTE — Telephone Encounter (Signed)
MyChart message sent to assess for need of refill.

## 2022-06-11 NOTE — Telephone Encounter (Signed)
Patient would like a refill due to Cabenuva side effects and migraine symptoms. Okay to refill?  Sandie Ano, RN

## 2022-06-19 ENCOUNTER — Other Ambulatory Visit (HOSPITAL_COMMUNITY): Payer: Self-pay

## 2022-06-20 ENCOUNTER — Telehealth: Payer: Self-pay

## 2022-06-20 NOTE — Telephone Encounter (Signed)
[facility] Patient Advocate Encounter  Patient's medication (Cabenuva) have been couriered to [facility] from [facility] and will be administered on the patient next office visit on 07/05/22.  Amro Winebarger , CPhT Specialty [facility] Patient Advocate [facility] Phone: [phone] Fax:  [phone]  

## 2022-07-03 ENCOUNTER — Ambulatory Visit: Payer: Medicare Other

## 2022-07-03 ENCOUNTER — Encounter: Payer: Medicare Other | Admitting: Pharmacist

## 2022-07-05 ENCOUNTER — Ambulatory Visit (INDEPENDENT_AMBULATORY_CARE_PROVIDER_SITE_OTHER): Payer: Medicare Other | Admitting: Pharmacist

## 2022-07-05 ENCOUNTER — Encounter: Payer: Self-pay | Admitting: Infectious Diseases

## 2022-07-05 ENCOUNTER — Ambulatory Visit: Payer: Medicare Other

## 2022-07-05 ENCOUNTER — Other Ambulatory Visit: Payer: Self-pay

## 2022-07-05 ENCOUNTER — Encounter: Payer: Medicare Other | Admitting: Pharmacist

## 2022-07-05 DIAGNOSIS — Z21 Asymptomatic human immunodeficiency virus [HIV] infection status: Secondary | ICD-10-CM

## 2022-07-05 MED ORDER — CABOTEGRAVIR & RILPIVIRINE ER 600 & 900 MG/3ML IM SUER
1.0000 | Freq: Once | INTRAMUSCULAR | Status: AC
  Administered 2022-07-05: 1 via INTRAMUSCULAR

## 2022-07-05 NOTE — Progress Notes (Signed)
HPI: Laura Jones is a 51 y.o. female who presents to the [place] clinic for [place] administration.  Patient Active Problem List   Diagnosis Date Noted   Vaginitis 03/06/2022   Hypotension 03/06/2022   Elevated serum creatinine 05/30/2020   Anemia 05/30/2020   Hepatic cirrhosis ([place]) 05/27/2019   Closed bimalleolar fracture of left ankle 02/03/2019   VIN III (vulvar intraepithelial neoplasia III) 10/14/2018   OA (osteoarthritis) of hip 06/18/2018   Hypercholesteremia 12/08/2017   History of total right hip replacement 09/18/2017   Hepatitis C virus infection cured after antiviral drug therapy 07/30/2017   Acid reflux 07/30/2017   HIV (human immunodeficiency virus infection) ([place]) 07/29/2017   Cystocele, lateral 07/25/2016   Avascular necrosis of bone of hip, left ([place]) 09/13/2015   Erythropoietic porphyria ([place]) 09/13/2015   Depression 07/01/2015    Patient's Medications  New Prescriptions   No medications on file  Previous Medications   AMLODIPINE (NORVASC) 5 MG TABLET    Take 5 mg by mouth daily.   ATORVASTATIN (LIPITOR) 10 MG TABLET    Take 10 mg by mouth at bedtime.   BUPROPION (ZYBAN) 150 MG 12 HR TABLET    Take 150 mg by mouth 2 (two) times daily.   BUSPIRONE (BUSPAR) 15 MG TABLET    Take 15 mg by mouth 2 (two) times daily.   CABOTEGRAVIR & RILPIVIRINE ER (CABENUVA) 600 & 900 MG/3ML INJECTION    Inject 1 kit into the muscle every 2 (two) months.   CHOLECALCIFEROL (VITAMIN D) 50 MCG (2000 UT) TABLET    Take 4,000 Units by mouth daily.    CITALOPRAM (CELEXA) 40 MG TABLET    Take 40 mg by mouth daily.   DEXLANSOPRAZOLE (DEXILANT) 60 MG CAPSULE    Take 60 mg by mouth daily.   DICYCLOMINE (BENTYL) 10 MG CAPSULE    Take 10 mg by mouth 2 (two) times daily.    DIPHENHYDRAMINE-ACETAMINOPHEN (TYLENOL PM) 25-500 MG TABS TABLET    Take 1 tablet by mouth daily as needed.   DOCUSATE SODIUM (COLACE) 100 MG CAPSULE    Take 100 mg by mouth daily.    IBANDRONATE (BONIVA)  150 MG TABLET    Take 150 mg by mouth every 30 (thirty) days.   LOPERAMIDE (IMODIUM A-D) 2 MG TABLET    Take 2 mg by mouth as needed for diarrhea or loose stools.   METOPROLOL TARTRATE (LOPRESSOR) 25 MG TABLET    Take 25 mg by mouth daily.   ONDANSETRON (ZOFRAN-ODT) 8 MG DISINTEGRATING TABLET    TAKE 1 TABLET BY MOUTH EVERY 8 HOURS AS NEEDED FOR NAUSEA AND VOMITING   SENNA (SENOKOT) 8.6 MG TABLET    Take 1 tablet by mouth daily.    TIZANIDINE (ZANAFLEX) 4 MG TABLET    Take 4 mg by mouth at bedtime.   TRAZODONE (DESYREL) 100 MG TABLET    Take 100-200 mg by mouth at bedtime.   VITAMIN B-12 (CYANOCOBALAMIN) 500 MCG TABLET    Take 500 mcg by mouth daily. Two tablets at night  Modified Medications   No medications on file  Discontinued Medications   No medications on file    Allergies: Allergies  Allergen Reactions   Levaquin [Levofloxacin] Rash    Difficulty breathing, mouth swelling/rash   Fish Oil Nausea And Vomiting   Compazine [Prochlorperazine Edisylate]     Muscle twitching   Droperidol     uncontrolled muscle movements    Imitrex [Sumatriptan]  Difficulty breathing, chest pain; All "triptans"   Reglan [Metoclopramide]     Uncontrolled twitching   Sulfa Antibiotics Hives    Hives    Tagamet (Premixed [Cimetidine]    Thorazine [Chlorpromazine]     Uncontrolled muscle twitching    Past Medical History: Past Medical History:  Diagnosis Date   Anemia    hx of    Anxiety    Arthritis    left knee   Avascular necrosis of bone of hip, left (HCC)    Bulging lumbar disc    Depression    Dyspnea    with excertion   GERD (gastroesophageal reflux disease)    Hepatitis    C treated 2 years ago went undetected   History of kidney stones    HIV (human immunodeficiency virus infection) ([place]) 07/29/2017   HIV infection (HCC)    Insomnia    Migraines    Nerve pain    Perimenopausal    Porphyria ([place])    PTSD (post-traumatic stress disorder)    Spinal stenosis of  lumbosacral region    Tendonitis    right arm    Trauma     Social History: Social History   Socioeconomic History   Marital status: Divorced    Spouse name: Not on file   Number of children: Not on file   Years of education: Not on file   Highest education level: Not on file  Occupational History   Not on file  Tobacco Use   Smoking status: Never   Smokeless tobacco: Never  Vaping Use   Vaping Use: Never used  Substance and Sexual Activity   Alcohol use: No   Drug use: No   Sexual activity: Not Currently    Birth control/protection: Surgical    Comment: hyst  Other Topics Concern   Not on file  Social History Narrative   Not on file   Social Determinants of Health   Financial Resource Strain: Not on file  Food Insecurity: Not on file  Transportation Needs: Not on file  Physical Activity: Not on file  Stress: Not on file  Social Connections: Not on file    Labs: Lab Results  Component Value Date   HIV1RNAQUANT 38 (H) 05/09/2022   HIV1RNAQUANT 27 (H) 03/06/2022   HIV1RNAQUANT <20 (H) 01/08/2022   CD4TABS 758 12/29/2020   CD4TABS 487 05/10/2020   CD4TABS 547 10/29/2019    RPR and STI Lab Results  Component Value Date   LABRPR NON-REACTIVE 05/10/2020    STI Results GC CT  05/10/2020 11:08 AM Negative  Negative     Hepatitis B Lab Results  Component Value Date   HEPBSAB NON-REACTIVE 07/29/2017   HEPBSAG NON-REACTIVE 07/29/2017   Hepatitis C No results found for: "HEPCAB", "HCVRNAPCRQN" Hepatitis A Lab Results  Component Value Date   HAV REACTIVE (A) 07/29/2017   Lipids: Lab Results  Component Value Date   CHOL 173 05/11/2019   TRIG 198 (H) 05/11/2019   HDL 52 05/11/2019   CHOLHDL 3.3 05/11/2019   VLDL 65 (H) 07/29/2017   LDLCALC 91 05/11/2019    TARGET DATE: The 14th  Assessment: [name] presents today for her maintenance Cabenuva injections. Past injections were tolerated well without issues. She states it makes her a little  fatigued for about 2 days afterwards.  Administered cabotegravir 600mg /46mL in left upper outer quadrant of the gluteal muscle. Administered rilpivirine 900 mg/51mL in the right upper outer quadrant of the gluteal muscle. No issues with injections. She  will follow up in 2 months for next set of injections.  Plan: - Cabenuva injections administered - HIV RNA today - Next injections scheduled for 9/18 with Colletta Maryland and 11/14 with me - Call with any issues or questions  Tauren Delbuono L. Bonni Neuser, PharmD, BCIDP, AAHIVP, [place] Clinical Pharmacist Practitioner [place] [facility]

## 2022-07-09 LAB — HIV-1 RNA QUANT-NO REFLEX-BLD
HIV 1 RNA Quant: <20 Copies/mL — ABNORMAL HIGH
HIV-1 RNA Quant, Log: <1.3 Log cps/mL — ABNORMAL HIGH

## 2022-07-18 ENCOUNTER — Ambulatory Visit (HOSPITAL_COMMUNITY)
Admission: RE | Admit: 2022-07-18 | Discharge: 2022-07-18 | Disposition: A | Discharge status: Home / Self Care | Payer: Medicare Other | Source: Ambulatory Visit | Attending: Infectious Diseases | Admitting: Infectious Diseases

## 2022-07-18 DIAGNOSIS — K7460 Unspecified cirrhosis of liver: Secondary | ICD-10-CM

## 2022-07-18 DIAGNOSIS — Z8619 Personal history of other infectious and parasitic diseases: Secondary | ICD-10-CM

## 2022-08-21 ENCOUNTER — Other Ambulatory Visit (HOSPITAL_COMMUNITY): Payer: Self-pay

## 2022-08-22 ENCOUNTER — Other Ambulatory Visit (HOSPITAL_COMMUNITY): Payer: Self-pay

## 2022-08-23 ENCOUNTER — Telehealth: Payer: Self-pay

## 2022-08-23 NOTE — Telephone Encounter (Signed)
[facility] Patient Advocate Encounter  Patient's medication Renaldo Harrison) have been couriered to [facility] from [employer] [employer] and will be administered on the patient next office visit on 09/10/22.  Clearance Coots , CPhT [facility]ty [facility] for Infectious Disease Phone: [phone] Fax:  [phone]

## 2022-09-01 DIAGNOSIS — G43809 Other migraine, not intractable, without status migrainosus: Secondary | ICD-10-CM | POA: Diagnosis not present

## 2022-09-01 DIAGNOSIS — K219 Gastro-esophageal reflux disease without esophagitis: Secondary | ICD-10-CM | POA: Diagnosis not present

## 2022-09-01 DIAGNOSIS — F32A Depression, unspecified: Secondary | ICD-10-CM | POA: Diagnosis not present

## 2022-09-10 ENCOUNTER — Other Ambulatory Visit: Payer: Self-pay

## 2022-09-10 ENCOUNTER — Ambulatory Visit (INDEPENDENT_AMBULATORY_CARE_PROVIDER_SITE_OTHER): Payer: Medicare Other | Admitting: Infectious Diseases

## 2022-09-10 ENCOUNTER — Encounter: Payer: Self-pay | Admitting: Infectious Diseases

## 2022-09-10 DIAGNOSIS — Z21 Asymptomatic human immunodeficiency virus [HIV] infection status: Secondary | ICD-10-CM | POA: Diagnosis not present

## 2022-09-10 DIAGNOSIS — Z8619 Personal history of other infectious and parasitic diseases: Secondary | ICD-10-CM

## 2022-09-10 MED ORDER — CABOTEGRAVIR & RILPIVIRINE ER 600 & 900 MG/3ML IM SUER
1.0000 | Freq: Once | INTRAMUSCULAR | Status: AC
  Administered 2022-09-10: 1 via INTRAMUSCULAR

## 2022-09-10 NOTE — Assessment & Plan Note (Addendum)
Very well controlled on every 2 month Cabenuva injections. No concerns with access or adherence to medication. They are tolerating the medication well without side effects. No drug interactions identified. She has had durably suppressed VL < 50 for the last 9 months. We can proceed with Q71m viral load monitoring, she is comfortable with this. Defer to next injection in Nov. CD4 2x year with h/o liver disease.  No changes to insurance coverage.  No dental needs today.  No concern over anxious/depressed mood.  Sexual health and family planning discussed - following with GYN for h/o VIN.  Vaccines updated today - see health maintenance section.   RTC as scheduled with pharmacy team for maintenance cabenuva injections.  11/06/2022

## 2022-09-10 NOTE — Addendum Note (Signed)
Addended by: Lin Landsman E on: 09/10/2022 12:39 PM   Modules accepted: Orders

## 2022-09-10 NOTE — Patient Instructions (Signed)
Nice to see you   Cool packs, topical over the counter steroid cream up to 3 x a day and adding once a day antihistamines 2 days before through 3 days after to see if this helps with the histamine reaction that is making you itch.

## 2022-09-10 NOTE — Assessment & Plan Note (Signed)
Ultrasound reviewed - no tumor effect noted. No significant radiographic findings for cirrhosis but records with biopsy + for such.  Continue with AFP + [place] for [place] screening q47m. Next due in February  AFP can be collected around March or so.

## 2022-09-10 NOTE — Progress Notes (Signed)
Patient Name: Laura Jones  Date of Birth: July 29, 1971 MRN: [id number]  PCP: Toma Deiters, MD    SUBJECTIVE:  Brief Narrative:   Laura Jones is a 51 y.o. female with HIV disease, dx "a long time ago." Relocated from [place] in 2018 where she was in care with [facility].  History of Hep C, cured (s/p treatment x 2, HCV Quant < 15, 07/29/2017).   +Cirrhosis per notes from previous liver biopsies  S/P hysterectomy Hep B sAg (-), Hep B sAb (-) following several attempts to vaccinate.  History of OIs: none known.  HIV Risk: heterosexual contact  Previous Regimens:  Prezista + Norvir + Truvada  Genvoya >> suppressed  Biktarvy 09/2018 >> switched d/t DDI with medications Cabenuva injections, 11/2021   Genotype:  K103N - NNRTI resistance per chart records     Chief Complaint  Patient presents with   Follow-up      HPI: Here for routine follow up HIV and cabenuva injection visit.   She has noticed some itching in the right buttock that is pretty deep. Has only noticed it happening the last few injections with the right (rilpivarine).   Has been working with PCP on blood pressure regulation. Feels better with only Metoprolol BID and then PRN amlodipine in the evenings if her BP is high and she has her painful headaches from this.    Review of Systems  Constitutional:  Negative for chills and fever.  HENT:  Negative for tinnitus.   Eyes:  Negative for photophobia.  Respiratory:  Negative for cough.   Cardiovascular:  Negative for chest pain.  Gastrointestinal:  Negative for diarrhea, nausea and vomiting.  Genitourinary:  Negative for dysuria.  Skin:  Negative for rash.       Itching as per HPI  Neurological:  Negative for headaches.    Past Medical History:  Diagnosis Date   Anemia    hx of    Anxiety    Arthritis    left knee   Avascular necrosis of bone of hip, left (HCC)    Bulging lumbar disc    Depression    Dyspnea    with excertion    GERD (gastroesophageal reflux disease)    Hepatitis    C treated 2 years ago went undetected   History of kidney stones    HIV (human immunodeficiency virus infection) (HCC) 07/29/2017   HIV infection (HCC)    Insomnia    Migraines    Nerve pain    Perimenopausal    Porphyria (HCC)    PTSD (post-traumatic stress disorder)    Spinal stenosis of lumbosacral region    Tendonitis    right arm    Trauma     Social History   Tobacco Use   Smoking status: Never   Smokeless tobacco: Never  Vaping Use   Vaping Use: Never used  Substance Use Topics   Alcohol use: No   Drug use: No     Allergies  Allergen Reactions   Levaquin [Levofloxacin] Rash    Difficulty breathing, mouth swelling/rash   Fish Oil Nausea And Vomiting   Compazine [Prochlorperazine Edisylate]     Muscle twitching   Droperidol     uncontrolled muscle movements    Imitrex [Sumatriptan]     Difficulty breathing, chest pain; All "triptans"   Promethazine    Reglan [Metoclopramide]     Uncontrolled twitching   Sulfa Antibiotics Hives    Hives    Tagamet (  Premixed [Cimetidine]    Thorazine [Chlorpromazine]     Uncontrolled muscle twitching    Objective:  Vitals:   09/10/22 1137  BP: (!) 140/88  Pulse: 67  Temp: 97.8 F (36.6 C)  TempSrc: Oral  SpO2: 98%  Weight: 193 lb (87.5 kg)  Height: 5\' 1"  (1.549 m)   Body mass index is 36.47 kg/m.  Physical Exam Constitutional:      Appearance: Normal appearance. She is not ill-appearing.  HENT:     Mouth/Throat:     Mouth: Mucous membranes are moist.     Pharynx: Oropharynx is clear.  Eyes:     General: No scleral icterus. Cardiovascular:     Rate and Rhythm: Normal rate and regular rhythm.  Pulmonary:     Effort: Pulmonary effort is normal.  Neurological:     Mental Status: She is oriented to person, place, and time.  Psychiatric:        Mood and Affect: Mood normal.        Thought Content: Thought content normal.     Lab Results HIV 1  RNA Quant  Date Value  07/05/2022 <20 Copies/mL (H)  05/09/2022 38 copies/mL (H)  03/06/2022 27 Copies/mL (H)   CD4 T Cell Abs (/uL)  Date Value  12/29/2020 758  05/10/2020 487  10/29/2019 547   Lab Results  Component Value Date   ALT 10 01/08/2022   AST 14 01/08/2022   ALKPHOS 95 12/19/2019   BILITOT 0.2 01/08/2022   Lab Results  Component Value Date   CREATININE 1.06 (H) 01/08/2022   CREATININE 1.14 (H) 05/10/2020   CREATININE 1.03 (H) 12/19/2019     ASSESSMENT & PLAN:   Problem List Items Addressed This Visit       Unprioritized   HIV (human immunodeficiency virus infection) ([place]) (Chronic)    Very well controlled on every 2 month Cabenuva injections. No concerns with access or adherence to medication. They are tolerating the medication well without side effects. No drug interactions identified. She has had durably suppressed VL < 50 for the last 9 months. We can proceed with Q57m viral load monitoring, she is comfortable with this. Defer to next injection in Nov. CD4 2x year with h/o liver disease.  No changes to insurance coverage.  No dental needs today.  No concern over anxious/depressed mood.  Sexual health and family planning discussed - following with GYN for h/o VIN.  Vaccines updated today - see health maintenance section.   RTC as scheduled with pharmacy team for maintenance cabenuva injections.  11/06/2022        Hepatitis C virus infection cured after antiviral drug therapy    Ultrasound reviewed - no tumor effect noted. No significant radiographic findings for cirrhosis but records with biopsy + for such.  Continue with AFP + [place] for [place] screening q51m. Next due in February  AFP can be collected around March or so.       Relevant Orders   US ABDOMEN LIMITED RUQ (LIVER/GB)    Janene Madeira, MSN, NP-C [facility] for Infectious Disease [email or web address] Pager: [phone] Office: [phone] [place]: [phone]

## 2022-10-16 ENCOUNTER — Other Ambulatory Visit (HOSPITAL_COMMUNITY): Payer: Self-pay

## 2022-10-17 ENCOUNTER — Other Ambulatory Visit (HOSPITAL_COMMUNITY): Payer: Self-pay

## 2022-10-18 ENCOUNTER — Telehealth: Payer: Self-pay

## 2022-10-18 ENCOUNTER — Other Ambulatory Visit (HOSPITAL_COMMUNITY): Payer: Self-pay

## 2022-10-18 NOTE — Telephone Encounter (Signed)
[facility] Patient Advocate Encounter  Patient's medication (Cabenuva) have been couriered to [facility] from [facility] and will be administered on the patient next office visit on 11/06/22.  Solace Wendorff , CPhT Specialty Pharmacy Patient [facility] for [facility]ious Disease Phone: [phone] Fax:  [phone]  

## 2022-11-06 ENCOUNTER — Encounter: Payer: Medicare Other | Admitting: Pharmacist

## 2022-11-06 ENCOUNTER — Telehealth: Payer: Self-pay | Admitting: Pharmacist

## 2022-11-06 NOTE — Telephone Encounter (Signed)
Rcvd a vm in regards to pt stating that she is not feeling well and would like to resched appt for later this week. Preferably Thurs or Fri. There was no call back #, but called pt on mobile # listed and left a HIPAA Compliant VM for rescheduling.

## 2022-11-07 DIAGNOSIS — E782 Mixed hyperlipidemia: Secondary | ICD-10-CM | POA: Diagnosis not present

## 2022-11-07 DIAGNOSIS — G43909 Migraine, unspecified, not intractable, without status migrainosus: Secondary | ICD-10-CM | POA: Diagnosis not present

## 2022-11-07 DIAGNOSIS — Z0000 Encounter for general adult medical examination without abnormal findings: Secondary | ICD-10-CM | POA: Diagnosis not present

## 2022-11-07 DIAGNOSIS — J302 Other seasonal allergic rhinitis: Secondary | ICD-10-CM | POA: Diagnosis not present

## 2022-11-07 DIAGNOSIS — G629 Polyneuropathy, unspecified: Secondary | ICD-10-CM | POA: Diagnosis not present

## 2022-11-07 DIAGNOSIS — I10 Essential (primary) hypertension: Secondary | ICD-10-CM | POA: Diagnosis not present

## 2022-11-07 DIAGNOSIS — N189 Chronic kidney disease, unspecified: Secondary | ICD-10-CM | POA: Diagnosis not present

## 2022-11-07 DIAGNOSIS — K589 Irritable bowel syndrome without diarrhea: Secondary | ICD-10-CM | POA: Diagnosis not present

## 2022-11-07 NOTE — Progress Notes (Signed)
HPI: Laura Jones is a 51 y.o. female who presents to the [facility] for [place] administration.  Patient Active Problem List   Diagnosis Date Noted   Elevated serum creatinine 05/30/2020   Anemia 05/30/2020   Hepatic cirrhosis ([place]) 05/27/2019   Closed bimalleolar fracture of left ankle 02/03/2019   VIN III (vulvar intraepithelial neoplasia III) 10/14/2018   OA (osteoarthritis) of hip 06/18/2018   Hypercholesteremia 12/08/2017   History of total right hip replacement 09/18/2017   Hepatitis C virus infection cured after antiviral drug therapy 07/30/2017   Acid reflux 07/30/2017   HIV (human immunodeficiency virus infection) ([place]) 07/29/2017   Cystocele, lateral 07/25/2016   Avascular necrosis of bone of hip, left ([place]) 09/13/2015   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   ATORVASTATIN (LIPITOR) 10 MG TABLET    Take 10 mg by mouth at bedtime.   BUPROPION (ZYBAN) 150 MG 12 HR TABLET    Take 150 mg by mouth 2 (two) times daily.   BUSPIRONE (BUSPAR) 15 MG TABLET    Take 15 mg by mouth 2 (two) times daily.   CABOTEGRAVIR & RILPIVIRINE ER (CABENUVA) 600 & 900 MG/3ML INJECTION    Inject 1 kit into the muscle every 2 (two) months.   CHOLECALCIFEROL (VITAMIN D) 50 MCG (2000 UT) TABLET    Take 4,000 Units by mouth daily.    CITALOPRAM (CELEXA) 40 MG TABLET    Take 40 mg by mouth daily.   DEXLANSOPRAZOLE (DEXILANT) 60 MG CAPSULE    Take 60 mg by mouth daily.   DICYCLOMINE (BENTYL) 10 MG CAPSULE    Take 10 mg by mouth 2 (two) times daily.    DIPHENHYDRAMINE-ACETAMINOPHEN (TYLENOL PM) 25-500 MG TABS TABLET    Take 1 tablet by mouth daily as needed.   DOCUSATE SODIUM (COLACE) 100 MG  CAPSULE    Take 100 mg by mouth daily.   IBANDRONATE (BONIVA) 150 MG TABLET    Take 150 mg by mouth every 30 (thirty) days.   LOPERAMIDE (IMODIUM A-D) 2 MG TABLET    Take 2 mg by mouth as needed for diarrhea or loose stools.   METOPROLOL TARTRATE (LOPRESSOR) 25 MG TABLET    Take 25 mg by mouth daily.   ONDANSETRON (ZOFRAN-ODT) 8 MG DISINTEGRATING TABLET    TAKE 1 TABLET BY MOUTH EVERY 8 HOURS AS NEEDED FOR NAUSEA AND VOMITING   SENNA (SENOKOT) 8.6 MG TABLET    Take 1 tablet by mouth daily.    TIZANIDINE (ZANAFLEX) 4 MG TABLET    Take 4 mg by mouth at bedtime.   TRAZODONE (DESYREL) 100 MG TABLET    Take 100-200 mg by mouth at bedtime.   VITAMIN B-12 (CYANOCOBALAMIN) 500 MCG TABLET    Take 500 mcg by mouth daily. Two tablets at night  Modified Medications   No medications on file  Discontinued Medications   No medications on file    Allergies: Allergies  Allergen Reactions   Levaquin [Levofloxacin] Rash    Difficulty breathing, mouth swelling/rash   Fish Oil Nausea And Vomiting   Compazine [Prochlorperazine Edisylate]     Muscle twitching   Droperidol  uncontrolled muscle movements    Imitrex [Sumatriptan]     Difficulty breathing, chest pain; All "triptans"   Promethazine    Reglan [Metoclopramide]     Uncontrolled twitching   Sulfa Antibiotics Hives    Hives    Tagamet (Premixed [Cimetidine]    Thorazine [Chlorpromazine]     Uncontrolled muscle twitching    Past Medical History: Past Medical History:  Diagnosis Date   Anemia    hx of    Anxiety    Arthritis    left knee   Avascular necrosis of bone of hip, left (HCC)    Bulging lumbar disc    Depression    Dyspnea    with excertion   GERD (gastroesophageal reflux disease)    Hepatitis    C treated 2 years ago went undetected   History of kidney stones    HIV (human immunodeficiency virus infection) ([place]) 07/29/2017   HIV infection (HCC)    Insomnia    Migraines    Nerve pain    Perimenopausal    Porphyria  ([place])    PTSD (post-traumatic stress disorder)    Spinal stenosis of lumbosacral region    Tendonitis    right arm    Trauma     Social History: Social History   Socioeconomic History   Marital status: Divorced    Spouse name: Not on file   Number of children: Not on file   Years of education: Not on file   Highest education level: Not on file  Occupational History   Not on file  Tobacco Use   Smoking status: Never   Smokeless tobacco: Never  Vaping Use   Vaping Use: Never used  Substance and Sexual Activity   Alcohol use: No   Drug use: No   Sexual activity: Not Currently    Birth control/protection: Surgical    Comment: declined condoms (hyst)  Other Topics Concern   Not on file  Social History Narrative   Not on file   Social Determinants of Health   Financial Resource Strain: Not on file  Food Insecurity: Not on file  Transportation Needs: Not on file  Physical Activity: Not on file  Stress: Not on file  Social Connections: Not on file    Labs: Lab Results  Component Value Date   HIV1RNAQUANT <20 (H) 07/05/2022   HIV1RNAQUANT 38 (H) 05/09/2022   HIV1RNAQUANT 27 (H) 03/06/2022   CD4TABS 758 12/29/2020   CD4TABS 487 05/10/2020   CD4TABS 547 10/29/2019    RPR and STI Lab Results  Component Value Date   LABRPR NON-REACTIVE 05/10/2020    STI Results GC CT  05/10/2020 11:08 AM Negative  Negative     Hepatitis B Lab Results  Component Value Date   HEPBSAB NON-REACTIVE 07/29/2017   HEPBSAG NON-REACTIVE 07/29/2017   Hepatitis C No results found for: "HEPCAB", "HCVRNAPCRQN" Hepatitis A Lab Results  Component Value Date   HAV REACTIVE (A) 07/29/2017   Lipids: Lab Results  Component Value Date   CHOL 173 05/11/2019   TRIG 198 (H) 05/11/2019   HDL 52 05/11/2019   CHOLHDL 3.3 05/11/2019   VLDL 65 (H) 07/29/2017   LDLCALC 91 05/11/2019    TARGET DATE: The 14th of the month  Assessment: Laura Jones presents today for her  maintenance Cabenuva injections. Past injections were tolerated well without issues.  She is eligible for the Hep B, flu, COVID, meningococcal, Shingrix, and Tdap vaccines. She reports already getting the flu and COVID  vaccines for this year and got the 2nd Shingrix as well. She is interested in the meningococcal, Tdap and Hep B. The meningococcal and Hep B were administered in the right deltoid and the Tdap in the left deltoid. She was offered STI screening but declined at this time.  Administered cabotegravir 660m/3mL in left upper outer quadrant of the gluteal muscle. Administered rilpivirine 900 mg/322min the right upper outer quadrant of the gluteal muscle. No issues with injections. She will follow up in 2 months for next set of injections.  Plan: - Cabenuva injections administered - Next injections scheduled for 01/03/2023 at 1:45 PM with [place]eningococcal and Tdap vaccines - Collect HIV Viral Load and CD4 count - Call with any issues or questions  [facility]

## 2022-11-08 ENCOUNTER — Ambulatory Visit (INDEPENDENT_AMBULATORY_CARE_PROVIDER_SITE_OTHER): Payer: Medicare Other | Admitting: Pharmacist

## 2022-11-08 ENCOUNTER — Other Ambulatory Visit: Payer: Self-pay

## 2022-11-08 DIAGNOSIS — Z23 Encounter for immunization: Secondary | ICD-10-CM | POA: Diagnosis not present

## 2022-11-08 DIAGNOSIS — Z21 Asymptomatic human immunodeficiency virus [HIV] infection status: Secondary | ICD-10-CM | POA: Diagnosis not present

## 2022-11-08 MED ORDER — CABOTEGRAVIR & RILPIVIRINE ER 600 & 900 MG/3ML IM SUER
1.0000 | Freq: Once | INTRAMUSCULAR | Status: AC
  Administered 2022-11-08: 1 via INTRAMUSCULAR

## 2022-11-09 ENCOUNTER — Encounter: Payer: Self-pay | Admitting: Pharmacist

## 2022-11-09 DIAGNOSIS — I10 Essential (primary) hypertension: Secondary | ICD-10-CM | POA: Diagnosis not present

## 2022-11-09 DIAGNOSIS — N189 Chronic kidney disease, unspecified: Secondary | ICD-10-CM | POA: Diagnosis not present

## 2022-11-09 DIAGNOSIS — Z0000 Encounter for general adult medical examination without abnormal findings: Secondary | ICD-10-CM | POA: Diagnosis not present

## 2022-11-09 DIAGNOSIS — G629 Polyneuropathy, unspecified: Secondary | ICD-10-CM | POA: Diagnosis not present

## 2022-11-09 LAB — T-HELPER CELL (CD4) - (RCID CLINIC ONLY)
CD4 % Helper T Cell: 27 % — ABNORMAL LOW (ref 33–65)
CD4 T Cell Abs: 350 /uL — ABNORMAL LOW (ref 400–1790)

## 2022-11-09 NOTE — Telephone Encounter (Signed)
FYI - thought it may be better addressed by you. Thanks!

## 2022-11-10 LAB — HIV-1 RNA QUANT-NO REFLEX-BLD
HIV 1 RNA Quant: NOT DETECTED Copies/mL
HIV-1 RNA Quant, Log: NOT DETECTED Log cps/mL

## 2022-11-26 DIAGNOSIS — Z1231 Encounter for screening mammogram for malignant neoplasm of breast: Secondary | ICD-10-CM | POA: Diagnosis not present

## 2022-12-21 ENCOUNTER — Other Ambulatory Visit: Payer: Self-pay | Admitting: Pharmacist

## 2022-12-21 ENCOUNTER — Other Ambulatory Visit (HOSPITAL_COMMUNITY): Payer: Self-pay

## 2022-12-21 DIAGNOSIS — Z21 Asymptomatic human immunodeficiency virus [HIV] infection status: Secondary | ICD-10-CM

## 2022-12-21 MED ORDER — CABOTEGRAVIR & RILPIVIRINE ER 600 & 900 MG/3ML IM SUER
1.0000 | INTRAMUSCULAR | 5 refills | Status: DC
  Filled 2022-12-21: qty 6, 60d supply, fill #0
  Filled 2023-02-21: qty 6, 60d supply, fill #1
  Filled 2023-04-19: qty 6, 60d supply, fill #2
  Filled 2023-06-20: qty 6, 60d supply, fill #3
  Filled 2023-08-15: qty 6, 60d supply, fill #4
  Filled 2023-10-22: qty 6, 60d supply, fill #5

## 2022-12-25 ENCOUNTER — Other Ambulatory Visit (HOSPITAL_COMMUNITY): Payer: Self-pay

## 2022-12-25 ENCOUNTER — Other Ambulatory Visit: Payer: Self-pay

## 2022-12-26 ENCOUNTER — Telehealth: Payer: Self-pay

## 2022-12-26 DIAGNOSIS — L0390 Cellulitis, unspecified: Secondary | ICD-10-CM | POA: Diagnosis not present

## 2022-12-26 NOTE — Telephone Encounter (Signed)
[facility] Patient Advocate Encounter  Patient's medications have been couriered to [facility] from [facility]: [phone] , and will be administered on  01/03/2023.

## 2023-01-03 ENCOUNTER — Encounter: Payer: Medicare Other | Admitting: Pharmacist

## 2023-01-07 ENCOUNTER — Encounter: Payer: Medicare Other | Admitting: Pharmacist

## 2023-01-08 ENCOUNTER — Other Ambulatory Visit: Payer: Self-pay

## 2023-01-08 ENCOUNTER — Ambulatory Visit (INDEPENDENT_AMBULATORY_CARE_PROVIDER_SITE_OTHER): Payer: Medicare Other | Admitting: Pharmacist

## 2023-01-08 DIAGNOSIS — Z23 Encounter for immunization: Secondary | ICD-10-CM

## 2023-01-08 DIAGNOSIS — Z21 Asymptomatic human immunodeficiency virus [HIV] infection status: Secondary | ICD-10-CM | POA: Diagnosis not present

## 2023-01-08 MED ORDER — CABOTEGRAVIR & RILPIVIRINE ER 600 & 900 MG/3ML IM SUER
1.0000 | Freq: Once | INTRAMUSCULAR | Status: AC
  Administered 2023-01-08: 1 via INTRAMUSCULAR

## 2023-01-08 NOTE — Progress Notes (Addendum)
HPI: Laura Jones is a 52 y.o. female who presents to the [facility] for [place] administration.  Patient Active Problem List   Diagnosis Date Noted   Elevated serum creatinine 05/30/2020   Anemia 05/30/2020   Hepatic cirrhosis ([place]) 05/27/2019   Closed bimalleolar fracture of left ankle 02/03/2019   VIN III (vulvar intraepithelial neoplasia III) 10/14/2018   OA (osteoarthritis) of hip 06/18/2018   Hypercholesteremia 12/08/2017   History of total right hip replacement 09/18/2017   Hepatitis C virus infection cured after antiviral drug therapy 07/30/2017   Acid reflux 07/30/2017   HIV (human immunodeficiency virus infection) ([place]) 07/29/2017   Cystocele, lateral 07/25/2016   Avascular necrosis of bone of hip, left ([place]) 09/13/2015   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   ATORVASTATIN (LIPITOR) 10 MG TABLET    Take 10 mg by mouth at bedtime.   BUPROPION (ZYBAN) 150 MG 12 HR TABLET    Take 150 mg by mouth 2 (two) times daily.   BUSPIRONE (BUSPAR) 15 MG TABLET    Take 15 mg by mouth 2 (two) times daily.   CABOTEGRAVIR & RILPIVIRINE ER (CABENUVA) 600 & 900 MG/3ML INJECTION    Inject 1 kit into the muscle every 2 (two) months.   CHOLECALCIFEROL (VITAMIN D) 50 MCG (2000 UT) TABLET    Take 4,000 Units by mouth daily.    CITALOPRAM (CELEXA) 40 MG TABLET    Take 40 mg by mouth daily.   DEXLANSOPRAZOLE (DEXILANT) 60 MG CAPSULE    Take 60 mg by mouth daily.   DICYCLOMINE (BENTYL) 10 MG CAPSULE    Take 10 mg by mouth 2 (two) times daily.    DIPHENHYDRAMINE-ACETAMINOPHEN (TYLENOL PM) 25-500 MG TABS TABLET    Take 1 tablet by mouth daily as needed.   DOCUSATE SODIUM (COLACE) 100 MG  CAPSULE    Take 100 mg by mouth daily.   IBANDRONATE (BONIVA) 150 MG TABLET    Take 150 mg by mouth every 30 (thirty) days.   LOPERAMIDE (IMODIUM A-D) 2 MG TABLET    Take 2 mg by mouth as needed for diarrhea or loose stools.   METOPROLOL TARTRATE (LOPRESSOR) 25 MG TABLET    Take 25 mg by mouth daily.   ONDANSETRON (ZOFRAN-ODT) 8 MG DISINTEGRATING TABLET    TAKE 1 TABLET BY MOUTH EVERY 8 HOURS AS NEEDED FOR NAUSEA AND VOMITING   SENNA (SENOKOT) 8.6 MG TABLET    Take 1 tablet by mouth daily.    TIZANIDINE (ZANAFLEX) 4 MG TABLET    Take 4 mg by mouth at bedtime.   TRAZODONE (DESYREL) 100 MG TABLET    Take 100-200 mg by mouth at bedtime.   VITAMIN B-12 (CYANOCOBALAMIN) 500 MCG TABLET    Take 500 mcg by mouth daily. Two tablets at night  Modified Medications   No medications on file  Discontinued Medications   No medications on file    Allergies: Allergies  Allergen Reactions   Levaquin [Levofloxacin] Rash    Difficulty breathing, mouth swelling/rash   Fish Oil Nausea And Vomiting   Compazine [Prochlorperazine Edisylate]     Muscle twitching   Droperidol  uncontrolled muscle movements    Imitrex [Sumatriptan]     Difficulty breathing, chest pain; All "triptans"   Promethazine    Reglan [Metoclopramide]     Uncontrolled twitching   Sulfa Antibiotics Hives    Hives    Tagamet (Premixed [Cimetidine]    Thorazine [Chlorpromazine]     Uncontrolled muscle twitching    Past Medical History: Past Medical History:  Diagnosis Date   Anemia    hx of    Anxiety    Arthritis    left knee   Avascular necrosis of bone of hip, left (HCC)    Bulging lumbar disc    Depression    Dyspnea    with excertion   GERD (gastroesophageal reflux disease)    Hepatitis    C treated 2 years ago went undetected   History of kidney stones    HIV (human immunodeficiency virus infection) ([place]) 07/29/2017   HIV infection (HCC)    Insomnia    Migraines    Nerve pain    Perimenopausal    Porphyria  ([place])    PTSD (post-traumatic stress disorder)    Spinal stenosis of lumbosacral region    Tendonitis    right arm    Trauma     Social History: Social History   Socioeconomic History   Marital status: Divorced    Spouse name: Not on file   Number of children: Not on file   Years of education: Not on file   Highest education level: Not on file  Occupational History   Not on file  Tobacco Use   Smoking status: Never   Smokeless tobacco: Never  Vaping Use   Vaping Use: Never used  Substance and Sexual Activity   Alcohol use: No   Drug use: No   Sexual activity: Not Currently    Birth control/protection: Surgical    Comment: declined condoms (hyst)  Other Topics Concern   Not on file  Social History Narrative   Not on file   Social Determinants of Health   Financial Resource Strain: Not on file  Food Insecurity: Not on file  Transportation Needs: Not on file  Physical Activity: Not on file  Stress: Not on file  Social Connections: Not on file    Labs: Lab Results  Component Value Date   HIV1RNAQUANT Not Detected 11/08/2022   HIV1RNAQUANT <20 (H) 07/05/2022   HIV1RNAQUANT 38 (H) 05/09/2022   CD4TABS 350 (L) 11/08/2022   CD4TABS 758 12/29/2020   CD4TABS 487 05/10/2020    RPR and STI Lab Results  Component Value Date   LABRPR NON-REACTIVE 05/10/2020    STI Results GC CT  05/10/2020 11:08 AM Negative  Negative     Hepatitis B Lab Results  Component Value Date   HEPBSAB NON-REACTIVE 07/29/2017   HEPBSAG NON-REACTIVE 07/29/2017   Hepatitis C No results found for: "HEPCAB", "HCVRNAPCRQN" Hepatitis A Lab Results  Component Value Date   HAV REACTIVE (A) 07/29/2017   Lipids: Lab Results  Component Value Date   CHOL 173 05/11/2019   TRIG 198 (H) 05/11/2019   HDL 52 05/11/2019   CHOLHDL 3.3 05/11/2019   VLDL 65 (H) 07/29/2017   LDLCALC 91 05/11/2019    TARGET DATE: 14th of each month   Assessment: Laura Jones presents today for her  maintenance Cabenuva injections. Past injections were tolerated well without issues.She denies symptoms of an STI and declined screening at this time.   Administered cabotegravir 600mg /59mL in left upper outer quadrant of the  gluteal muscle. Administered rilpivirine 900 mg/24mL in the right upper outer quadrant of the gluteal muscle. No issues with injections. She will follow up in 2 months for next set of injections.  Laura Jones's second dose of Heplisav will also be administered in clinic today. She stated she had no issues with tolerating the first injection.   Plan: - Cabenuva injections administered - Heplisav #2 administered in clinic  - Obtain HepB surface antibody at next visit  - Next injections scheduled for 03/08/23 with Laura Jones and 04/30/23 with Laura Jones  - Call with any issues or questions  Adria Dill, PharmD PGY-2 Infectious Diseases Resident  01/08/2023 2:42 PM

## 2023-01-09 NOTE — Progress Notes (Addendum)
I have reviewed the patient with our PGY2 ID pharmacy resident, Liane Comber, and agree with his assessment and plan as outlined in the note below.  [name], PharmD, BCIDP, AAHIVP, CPP Clinical Pharmacist Practitioner Infectious Diseases [facility] Infectious Disease 01/09/2023, 2:59 PM

## 2023-02-06 DIAGNOSIS — N182 Chronic kidney disease, stage 2 (mild): Secondary | ICD-10-CM | POA: Diagnosis not present

## 2023-02-06 DIAGNOSIS — I10 Essential (primary) hypertension: Secondary | ICD-10-CM | POA: Diagnosis not present

## 2023-02-06 DIAGNOSIS — M25511 Pain in right shoulder: Secondary | ICD-10-CM | POA: Diagnosis not present

## 2023-02-06 DIAGNOSIS — G629 Polyneuropathy, unspecified: Secondary | ICD-10-CM | POA: Diagnosis not present

## 2023-02-06 DIAGNOSIS — Z0000 Encounter for general adult medical examination without abnormal findings: Secondary | ICD-10-CM | POA: Diagnosis not present

## 2023-02-20 ENCOUNTER — Other Ambulatory Visit (HOSPITAL_COMMUNITY): Payer: Self-pay

## 2023-02-21 ENCOUNTER — Other Ambulatory Visit (HOSPITAL_COMMUNITY): Payer: Self-pay

## 2023-02-26 ENCOUNTER — Ambulatory Visit (HOSPITAL_COMMUNITY): Payer: Medicare Other

## 2023-02-27 ENCOUNTER — Other Ambulatory Visit (HOSPITAL_COMMUNITY): Payer: Self-pay

## 2023-02-28 ENCOUNTER — Other Ambulatory Visit: Payer: Self-pay

## 2023-02-28 ENCOUNTER — Telehealth: Payer: Self-pay

## 2023-02-28 ENCOUNTER — Other Ambulatory Visit (HOSPITAL_COMMUNITY): Payer: Self-pay

## 2023-02-28 NOTE — Telephone Encounter (Signed)
[facility] Patient Advocate Encounter   I was successful in securing patient a $5000.00 grant from [employer] [place] (PAF) to provide copayment coverage for [place].  This will make the out of pocket cost $0.00.     I have spoken with the patient.    The billing information is as follows and has been shared with [place].           Patient knows to call the office with questions or concerns.  Ileene Patrick, [facility] Patient [facility] for Infectious Disease Phone: [phone] Fax:  [phone]

## 2023-03-01 ENCOUNTER — Telehealth: Payer: Self-pay

## 2023-03-01 NOTE — Telephone Encounter (Signed)
[facility] Patient Advocate Encounter  Patient's medication Kern Reap) have been couriered to [facility] from [facility] and will be administered on the patient next office visit on 03/08/23.  Ileene Patrick , [facility] Patient [facility] for Infectious Disease Phone: [phone] Fax:  [phone]

## 2023-03-08 ENCOUNTER — Ambulatory Visit: Payer: Medicare Other | Admitting: Infectious Diseases

## 2023-03-12 ENCOUNTER — Ambulatory Visit (INDEPENDENT_AMBULATORY_CARE_PROVIDER_SITE_OTHER): Payer: Medicare Other | Admitting: Pharmacist

## 2023-03-12 ENCOUNTER — Other Ambulatory Visit: Payer: Self-pay

## 2023-03-12 DIAGNOSIS — Z23 Encounter for immunization: Secondary | ICD-10-CM | POA: Diagnosis not present

## 2023-03-12 DIAGNOSIS — Z21 Asymptomatic human immunodeficiency virus [HIV] infection status: Secondary | ICD-10-CM

## 2023-03-12 MED ORDER — CABOTEGRAVIR & RILPIVIRINE ER 600 & 900 MG/3ML IM SUER
1.0000 | Freq: Once | INTRAMUSCULAR | Status: AC
  Administered 2023-03-12: 1 via INTRAMUSCULAR

## 2023-03-12 NOTE — Progress Notes (Signed)
HPI: Laura Jones is a 52 y.o. female who presents to the [facility] for [place] administration.  Patient Active Problem List   Diagnosis Date Noted   Elevated serum creatinine 05/30/2020   Anemia 05/30/2020   Hepatic cirrhosis ([place]) 05/27/2019   Closed bimalleolar fracture of left ankle 02/03/2019   VIN III (vulvar intraepithelial neoplasia III) 10/14/2018   OA (osteoarthritis) of hip 06/18/2018   Hypercholesteremia 12/08/2017   History of total right hip replacement 09/18/2017   Hepatitis C virus infection cured after antiviral drug therapy 07/30/2017   Acid reflux 07/30/2017   HIV (human immunodeficiency virus infection) ([place]) 07/29/2017   Cystocele, lateral 07/25/2016   Avascular necrosis of bone of hip, left ([place]) 09/13/2015   Erythropoietic porphyria ([place]) 09/13/2015   Depression 07/01/2015    Patient's Medications  New Prescriptions   No medications on file  Previous Medications   ALBUTEROL (VENTOLIN HFA) 108 (90 BASE) MCG/ACT INHALER    SMARTSIG:1 Puff(s) Via Inhaler 4 Times Daily PRN   AMLODIPINE (NORVASC) 5 MG TABLET    Take 5 mg by mouth daily.   ATORVASTATIN (LIPITOR) 10 MG TABLET    Take 10 mg by mouth at bedtime.   BUPROPION (ZYBAN) 150 MG 12 HR TABLET    Take 150 mg by mouth 2 (two) times daily.   BUSPIRONE (BUSPAR) 15 MG TABLET    Take 15 mg by mouth 2 (two) times daily.   CABOTEGRAVIR & RILPIVIRINE ER (CABENUVA) 600 & 900 MG/3ML INJECTION    Inject 1 kit into the muscle every 2 (two) months.   CHOLECALCIFEROL (VITAMIN D) 50 MCG (2000 UT) TABLET    Take 4,000 Units by mouth daily.    CITALOPRAM (CELEXA) 40 MG TABLET    Take 40 mg by mouth daily.   DEXLANSOPRAZOLE (DEXILANT) 60 MG CAPSULE    Take 60 mg by mouth daily.   DICYCLOMINE (BENTYL) 10 MG CAPSULE    Take 10 mg by mouth 2 (two) times daily.    DIPHENHYDRAMINE-ACETAMINOPHEN (TYLENOL PM) 25-500 MG TABS TABLET    Take 1 tablet by mouth daily as needed.   DOCUSATE SODIUM (COLACE) 100 MG CAPSULE     Take 100 mg by mouth daily.   IBANDRONATE (BONIVA) 150 MG TABLET    Take 150 mg by mouth every 30 (thirty) days.   LOPERAMIDE (IMODIUM A-D) 2 MG TABLET    Take 2 mg by mouth as needed for diarrhea or loose stools.   METOPROLOL TARTRATE (LOPRESSOR) 25 MG TABLET    Take 25 mg by mouth daily.   ONDANSETRON (ZOFRAN-ODT) 8 MG DISINTEGRATING TABLET    TAKE 1 TABLET BY MOUTH EVERY 8 HOURS AS NEEDED FOR NAUSEA AND VOMITING   SENNA (SENOKOT) 8.6 MG TABLET    Take 1 tablet by mouth daily.    TIZANIDINE (ZANAFLEX) 4 MG TABLET    Take 4 mg by mouth at bedtime.   TRAZODONE (DESYREL) 100 MG TABLET    Take 100-200 mg by mouth at bedtime.   VITAMIN B-12 (CYANOCOBALAMIN) 500 MCG TABLET    Take 500 mcg by mouth daily. Two tablets at night  Modified Medications   No medications on file  Discontinued Medications   No medications on file    Allergies: Allergies  Allergen Reactions   Levaquin [Levofloxacin] Rash    Difficulty breathing, mouth swelling/rash   Fish Oil Nausea And Vomiting   Compazine [Prochlorperazine Edisylate]     Muscle twitching   Droperidol  uncontrolled muscle movements    Imitrex [Sumatriptan]     Difficulty breathing, chest pain; All "triptans"   Promethazine    Reglan [Metoclopramide]     Uncontrolled twitching   Sulfa Antibiotics Hives    Hives    Tagamet (Premixed [Cimetidine]    Thorazine [Chlorpromazine]     Uncontrolled muscle twitching    Past Medical History: Past Medical History:  Diagnosis Date   Anemia    hx of    Anxiety    Arthritis    left knee   Avascular necrosis of bone of hip, left (HCC)    Bulging lumbar disc    Depression    Dyspnea    with excertion   GERD (gastroesophageal reflux disease)    Hepatitis    C treated 2 years ago went undetected   History of kidney stones    HIV (human immunodeficiency virus infection) ([place]) 07/29/2017   HIV infection (HCC)    Insomnia    Migraines    Nerve pain    Perimenopausal    Porphyria ([place])     PTSD (post-traumatic stress disorder)    Spinal stenosis of lumbosacral region    Tendonitis    right arm    Trauma     Social History: Social History   Socioeconomic History   Marital status: Divorced    Spouse name: Not on file   Number of children: Not on file   Years of education: Not on file   Highest education level: Not on file  Occupational History   Not on file  Tobacco Use   Smoking status: Never   Smokeless tobacco: Never  Vaping Use   Vaping Use: Never used  Substance and Sexual Activity   Alcohol use: No   Drug use: No   Sexual activity: Not Currently    Birth control/protection: Surgical    Comment: declined condoms (hyst)  Other Topics Concern   Not on file  Social History Narrative   Not on file   Social Determinants of Health   Financial Resource Strain: Not on file  Food Insecurity: Not on file  Transportation Needs: Not on file  Physical Activity: Not on file  Stress: Not on file  Social Connections: Not on file    Labs: Lab Results  Component Value Date   HIV1RNAQUANT Not Detected 11/08/2022   HIV1RNAQUANT <20 (H) 07/05/2022   HIV1RNAQUANT 38 (H) 05/09/2022   CD4TABS 350 (L) 11/08/2022   CD4TABS 758 12/29/2020   CD4TABS 487 05/10/2020    RPR and STI Lab Results  Component Value Date   LABRPR NON-REACTIVE 05/10/2020    STI Results GC CT  05/10/2020 11:08 AM Negative  Negative     Hepatitis B Lab Results  Component Value Date   HEPBSAB NON-REACTIVE 07/29/2017   HEPBSAG NON-REACTIVE 07/29/2017   Hepatitis C No results found for: "HEPCAB", "HCVRNAPCRQN" Hepatitis A Lab Results  Component Value Date   HAV REACTIVE (A) 07/29/2017   Lipids: Lab Results  Component Value Date   CHOL 173 05/11/2019   TRIG 198 (H) 05/11/2019   HDL 52 05/11/2019   CHOLHDL 3.3 05/11/2019   VLDL 65 (H) 07/29/2017   LDLCALC 91 05/11/2019    TARGET DATE: The 14th  Assessment: Laura Jones presents today for her maintenance Cabenuva  injections. Past injections were tolerated well without issues. She mentions inconsistent itching on her right side but says this has been occurring for a while and resolves with Benadryl. Counseled the patient to not  massage the injection area and she expressed her understanding. Will get HIV RNA and HepB titers today. The patient politely declined STI testing today. Counseled the patient on the meningococcal vaccine as she is due for a second dose and she was agreeable to receive the vaccine today.   Administered cabotegravir 600mg /56mL in left upper outer quadrant of the gluteal muscle. Administered rilpivirine 900 mg/47mL in the right upper outer quadrant of the gluteal muscle. No issues with injections. She will follow up in 2 months for next set of injections.  Plan: - Cabenuva injections administered - Get HIV RNA and HepB titers - Give 2nd dose of meningococcal vaccine - Next injections scheduled for 04/30/23 with Colletta [place] and 07/01/23 with Cassie - Call with any issues or questions   Billey Gosling, PharmD PGY1 Pharmacy Resident 3/19/20243:50 PM

## 2023-03-13 NOTE — Addendum Note (Signed)
Addended by: Michael Litter on: 03/13/2023 12:17 PM   Modules accepted: Orders

## 2023-03-15 ENCOUNTER — Ambulatory Visit (HOSPITAL_COMMUNITY): Payer: Medicare Other

## 2023-03-15 LAB — HEPATITIS B SURFACE ANTIBODY,QUALITATIVE: Hep B S Ab: REACTIVE — AB

## 2023-03-15 LAB — HIV-1 RNA QUANT-NO REFLEX-BLD
HIV 1 RNA Quant: <20 Copies/mL — ABNORMAL HIGH
HIV-1 RNA Quant, Log: <1.3 Log cps/mL — ABNORMAL HIGH

## 2023-03-29 ENCOUNTER — Ambulatory Visit (HOSPITAL_COMMUNITY)
Admission: RE | Admit: 2023-03-29 | Discharge: 2023-03-29 | Disposition: A | Discharge status: Home / Self Care | Payer: Medicare Other | Source: Ambulatory Visit | Attending: Infectious Diseases | Admitting: Infectious Diseases

## 2023-03-29 DIAGNOSIS — K7460 Unspecified cirrhosis of liver: Secondary | ICD-10-CM | POA: Diagnosis not present

## 2023-03-29 DIAGNOSIS — K760 Fatty (change of) liver, not elsewhere classified: Secondary | ICD-10-CM | POA: Diagnosis not present

## 2023-03-29 DIAGNOSIS — Z8619 Personal history of other infectious and parasitic diseases: Secondary | ICD-10-CM | POA: Diagnosis not present

## 2023-04-17 ENCOUNTER — Other Ambulatory Visit (HOSPITAL_COMMUNITY): Payer: Self-pay

## 2023-04-19 ENCOUNTER — Other Ambulatory Visit (HOSPITAL_COMMUNITY): Payer: Self-pay

## 2023-04-22 ENCOUNTER — Other Ambulatory Visit: Payer: Self-pay

## 2023-04-22 DIAGNOSIS — N3000 Acute cystitis without hematuria: Secondary | ICD-10-CM | POA: Diagnosis not present

## 2023-04-22 DIAGNOSIS — I10 Essential (primary) hypertension: Secondary | ICD-10-CM | POA: Diagnosis not present

## 2023-04-23 ENCOUNTER — Other Ambulatory Visit (HOSPITAL_COMMUNITY): Payer: Self-pay

## 2023-04-24 ENCOUNTER — Other Ambulatory Visit: Payer: Self-pay

## 2023-04-25 ENCOUNTER — Telehealth: Payer: Self-pay

## 2023-04-25 NOTE — Telephone Encounter (Signed)
[facility] Patient Advocate Encounter  Patient's medication (Cabenuva) have been couriered to [facility] from [facility] and will be administered on the patient next office visit on 04/30/23.  Mairin Lindsley , CPhT Specialty Pharmacy Patient Advocate [facility] Phone: [phone] Fax:  [phone]  

## 2023-04-30 ENCOUNTER — Encounter: Payer: Medicare Other | Admitting: Infectious Diseases

## 2023-04-30 ENCOUNTER — Encounter: Payer: Medicare Other | Admitting: Pharmacist

## 2023-05-02 ENCOUNTER — Encounter: Payer: Self-pay | Admitting: Infectious Diseases

## 2023-05-02 ENCOUNTER — Other Ambulatory Visit: Payer: Self-pay

## 2023-05-02 ENCOUNTER — Ambulatory Visit (INDEPENDENT_AMBULATORY_CARE_PROVIDER_SITE_OTHER): Payer: Medicare Other | Admitting: Infectious Diseases

## 2023-05-02 VITALS — BP 92/66 | HR 64 | Temp 97.8°F | Wt 182.0 lb

## 2023-05-02 DIAGNOSIS — Z79899 Other long term (current) drug therapy: Secondary | ICD-10-CM | POA: Diagnosis not present

## 2023-05-02 DIAGNOSIS — Z21 Asymptomatic human immunodeficiency virus [HIV] infection status: Secondary | ICD-10-CM

## 2023-05-02 DIAGNOSIS — Z8619 Personal history of other infectious and parasitic diseases: Secondary | ICD-10-CM

## 2023-05-02 DIAGNOSIS — K7460 Unspecified cirrhosis of liver: Secondary | ICD-10-CM | POA: Diagnosis not present

## 2023-05-02 LAB — CBC
HCT: 35.9 % (ref 35.0–45.0)
Hemoglobin: 11.7 g/dL (ref 11.7–15.5)
MPV: 11.2 fL (ref 7.5–12.5)

## 2023-05-02 MED ORDER — CABOTEGRAVIR & RILPIVIRINE ER 600 & 900 MG/3ML IM SUER
1.0000 | Freq: Once | INTRAMUSCULAR | Status: AC
Start: 2023-05-02 — End: 2023-05-02
  Administered 2023-05-02: 1 via INTRAMUSCULAR

## 2023-05-02 NOTE — Assessment & Plan Note (Signed)
Very well controlled on q51m cabenuva injections.. No concerns with access or adherence to medication. She has minor ISR pruritus and is fine to continue with symptomatic tx as she has been doing.   CD4 dropped to 340 acutely in November - suspect unrelated to HIV given ongoing VL < 50.  No changes to insurance coverage.  No dental needs today.  No concern over anxious/depressed mood.  Sexual health and family planning discussed - no needs identified today.  Vaccines updated today - see health maintenance section.   She will come back for her next 2 injections with pharmacy team and I will see her again in November. We can drop frequency of blood work back to Q26m as long as her CD4 returns to previously established baseline on todays draw.

## 2023-05-02 NOTE — Assessment & Plan Note (Signed)
Responded well to hep B booster and has protective immunity - we reviewed this today.

## 2023-05-02 NOTE — Assessment & Plan Note (Signed)
Liver ultrasound reviewed and no changes. Showing fatty liver infiltration vs cirrhotic features though she had cirrhosis documented from bx in the past.  Repeat AFP Q41m, U/S yearly.

## 2023-05-02 NOTE — Patient Instructions (Addendum)
Always a pleasure!  You will come back to see our pharmacy team for next round of cabenuva treatment on 07/01/2023 and for your September appointment.   I will plan to see you back in November for routine visit and blood work with that dose of cabenuva   Will plan an ultrasound again in 1 year for follow up of liver screening.

## 2023-05-02 NOTE — Progress Notes (Signed)
Patient Name: Laura Jones  Date of Birth: 1971/02/26 MRN: [id number]  PCP: Laura Deiters, MD    SUBJECTIVE:  Brief Narrative:   Laura Jones is a 52 y.o. female with HIV disease, dx "a long time ago." Relocated from [place] in 2018 where she was in care with [facility].  History of Hep C, cured (s/p treatment x 2, HCV Quant < 15, 07/29/2017).   +Cirrhosis per notes from previous liver biopsies  S/P hysterectomy Hep B sAg (-), Hep B sAb (-) following several attempts to vaccinate.  History of OIs: none known.  HIV Risk: heterosexual contact  Previous Regimens:  Prezista + Norvir + Truvada  Genvoya >> suppressed  Biktarvy 09/2018 >> switched d/t DDI with medications Cabenuva injections, 11/2021   Genotype:  K103N - NNRTI resistance per chart records     Chief Complaint  Patient presents with   Follow-up      HPI: Here for routine follow up HIV and cabenuva injection visit. Has a few questions about lab interpretation and wants to make sure her numbers are OK on the cabenuva.  Continues to have itching at the rt buttock following dose of rilpivirine - treats with hydrocortisone and benedryl. Wishes to continue cabenuva.   Just finished up antibiotics from a bladder infection - had a lot of dysuria and low back pain. All has resolved now.   Liver U/S discussed from April - no changes.    Review of Systems  Constitutional:  Negative for chills and fever.  HENT:  Negative for tinnitus.   Eyes:  Negative for photophobia.  Respiratory:  Negative for cough.   Cardiovascular:  Negative for chest pain.  Gastrointestinal:  Negative for diarrhea, nausea and vomiting.  Genitourinary:  Negative for dysuria.  Skin:  Negative for rash.       Itching as per HPI  Neurological:  Negative for headaches.    Past Medical History:  Diagnosis Date   Anemia    hx of    Anxiety    Arthritis    left knee   Avascular necrosis of bone of hip, left (HCC)     Bulging lumbar disc    Depression    Dyspnea    with excertion   GERD (gastroesophageal reflux disease)    Hepatitis    C treated 2 years ago went undetected   History of kidney stones    HIV (human immunodeficiency virus infection) (HCC) 07/29/2017   HIV infection (HCC)    Insomnia    Migraines    Nerve pain    Perimenopausal    Porphyria (HCC)    PTSD (post-traumatic stress disorder)    Spinal stenosis of lumbosacral region    Tendonitis    right arm    Trauma     Social History   Tobacco Use   Smoking status: Never   Smokeless tobacco: Never  Vaping Use   Vaping Use: Never used  Substance Use Topics   Alcohol use: No   Drug use: No     Allergies  Allergen Reactions   Levaquin [Levofloxacin] Rash    Difficulty breathing, mouth swelling/rash   Fish Oil Nausea And Vomiting   Compazine [Prochlorperazine Edisylate]     Muscle twitching   Droperidol     uncontrolled muscle movements    Imitrex [Sumatriptan]     Difficulty breathing, chest pain; All "triptans"   Promethazine    Reglan [Metoclopramide]     Uncontrolled twitching   Sulfa  Antibiotics Hives    Hives    Tagamet (Premixed [Cimetidine]    Thorazine [Chlorpromazine]     Uncontrolled muscle twitching    Objective:  Vitals:   05/02/23 1435  BP: 92/66  Pulse: 64  Temp: 97.8 F (36.6 C)  TempSrc: Oral  SpO2: 96%  Weight: 182 lb (82.6 kg)   Body mass index is 34.39 kg/m.   Physical Exam Constitutional:      Appearance: Normal appearance. She is not ill-appearing.  HENT:     Mouth/Throat:     Mouth: Mucous membranes are moist.     Pharynx: Oropharynx is clear.  Eyes:     General: No scleral icterus. Cardiovascular:     Rate and Rhythm: Normal rate and regular rhythm.  Pulmonary:     Effort: Pulmonary effort is normal.  Neurological:     Mental Status: She is oriented to person, place, and time.  Psychiatric:        Mood and Affect: Mood normal.        Thought Content: Thought  content normal.     Lab Results HIV 1 RNA Quant (Copies/mL)  Date Value  03/12/2023 <20 (H)  11/08/2022 Not Detected  07/05/2022 <20 (H)   CD4 T Cell Abs (/uL)  Date Value  11/08/2022 350 (L)  12/29/2020 758  05/10/2020 487   Lab Results  Component Value Date   ALT 10 01/08/2022   AST 14 01/08/2022   ALKPHOS 95 12/19/2019   BILITOT 0.2 01/08/2022   Lab Results  Component Value Date   CREATININE 1.06 (H) 01/08/2022   CREATININE 1.14 (H) 05/10/2020   CREATININE 1.03 (H) 12/19/2019     ASSESSMENT & PLAN:   Problem List Items Addressed This Visit       Unprioritized   HIV (human immunodeficiency virus infection) (HCC) - Primary (Chronic)    Very well controlled on q70m cabenuva injections.. No concerns with access or adherence to medication. She has minor ISR pruritus and is fine to continue with symptomatic tx as she has been doing.   CD4 dropped to 340 acutely in November - suspect unrelated to HIV given ongoing VL < 50.  No changes to insurance coverage.  No dental needs today.  No concern over anxious/depressed mood.  Sexual health and family planning discussed - no needs identified today.  Vaccines updated today - see health maintenance section.   She will come back for her next 2 injections with pharmacy team and I will see her again in November. We can drop frequency of blood work back to Q28m as long as her CD4 returns to previously established baseline on todays draw.        Relevant Orders   Lipid panel   T-helper cells (CD4) count   Comp Met (CMET)   HIV 1 RNA quant-no reflex-bld   CBC   Hepatitis C virus infection cured after antiviral drug therapy    Liver ultrasound reviewed and no changes. Showing fatty liver infiltration vs cirrhotic features though she had cirrhosis documented from bx in the past.  Repeat AFP Q76m, U/S yearly.       Hepatic cirrhosis (HCC)   Relevant Orders   AFP tumor marker   Laura Alberts, MSN, NP-C [facility]  [email or web address] Pager: [phone] Office: [phone] [facility] Main Line: [phone]

## 2023-05-03 ENCOUNTER — Encounter: Payer: Medicare Other | Admitting: Infectious Diseases

## 2023-05-03 LAB — LIPID PANEL
Cholesterol: 185 mg/dL (ref <200)
Non-HDL Cholesterol (Calc): 140 mg/dL (calc) — ABNORMAL HIGH (ref <130)

## 2023-05-03 LAB — COMPREHENSIVE METABOLIC PANEL
AG Ratio: 1.4 (calc) (ref 1.0–2.5)
ALT: 9 U/L (ref 6–29)
AST: 14 U/L (ref 10–35)
BUN/Creatinine Ratio: 9 (calc) (ref 6–22)
BUN: 10 mg/dL (ref 7–25)
Globulin: 3.1 g/dL (calc) (ref 1.9–3.7)
Potassium: 3.5 mmol/L (ref 3.5–5.3)
Sodium: 140 mmol/L (ref 135–146)

## 2023-05-03 LAB — T-HELPER CELLS (CD4) COUNT (NOT AT ARMC)
CD4 % Helper T Cell: 31 % — ABNORMAL LOW (ref 33–65)
CD4 T Cell Abs: 470 /uL (ref 400–1790)

## 2023-05-04 LAB — HIV-1 RNA QUANT-NO REFLEX-BLD: HIV 1 RNA Quant: NOT DETECTED Copies/mL

## 2023-05-06 LAB — COMPREHENSIVE METABOLIC PANEL
Albumin: 4.4 g/dL (ref 3.6–5.1)
Alkaline phosphatase (APISO): 47 U/L (ref 37–153)
CO2: 25 mmol/L (ref 20–32)
Calcium: 9.2 mg/dL (ref 8.6–10.4)
Chloride: 105 mmol/L (ref 98–110)
Creat: 1.17 mg/dL — ABNORMAL HIGH (ref 0.50–1.03)
Glucose, Bld: 85 mg/dL (ref 65–99)
Total Bilirubin: 0.3 mg/dL (ref 0.2–1.2)
Total Protein: 7.5 g/dL (ref 6.1–8.1)

## 2023-05-06 LAB — HIV-1 RNA QUANT-NO REFLEX-BLD: HIV-1 RNA Quant, Log: NOT DETECTED Log cps/mL

## 2023-05-06 LAB — CBC
MCH: 31.3 pg (ref 27.0–33.0)
MCHC: 32.6 g/dL (ref 32.0–36.0)
MCV: 96 fL (ref 80.0–100.0)
Platelets: 246 10*3/uL (ref 140–400)
RBC: 3.74 10*6/uL — ABNORMAL LOW (ref 3.80–5.10)
RDW: 14.5 % (ref 11.0–15.0)
WBC: 4.6 10*3/uL (ref 3.8–10.8)

## 2023-05-06 LAB — LIPID PANEL
HDL: 45 mg/dL — ABNORMAL LOW (ref >=50)
LDL Cholesterol (Calc): 95 mg/dL (calc)
Total CHOL/HDL Ratio: 4.1 (calc) (ref <5.0)
Triglycerides: 336 mg/dL — ABNORMAL HIGH (ref <150)

## 2023-05-06 LAB — AFP TUMOR MARKER: AFP-Tumor Marker: 3.9 ng/mL

## 2023-05-07 DIAGNOSIS — N182 Chronic kidney disease, stage 2 (mild): Secondary | ICD-10-CM | POA: Diagnosis not present

## 2023-05-07 DIAGNOSIS — N3000 Acute cystitis without hematuria: Secondary | ICD-10-CM | POA: Diagnosis not present

## 2023-05-07 DIAGNOSIS — E7849 Other hyperlipidemia: Secondary | ICD-10-CM | POA: Diagnosis not present

## 2023-05-07 DIAGNOSIS — I10 Essential (primary) hypertension: Secondary | ICD-10-CM | POA: Diagnosis not present

## 2023-05-07 DIAGNOSIS — K219 Gastro-esophageal reflux disease without esophagitis: Secondary | ICD-10-CM | POA: Diagnosis not present

## 2023-06-07 IMAGING — US US ABDOMEN LIMITED
1 series · 14 of 25 positions shown · non-contrast
Comparison: 10/14/2020

CLINICAL DATA: Hepatitis C. Screening for cirrhosis.

EXAM:
ULTRASOUND ABDOMEN LIMITED RIGHT UPPER QUADRANT

[Series 1: us abdomen limited ruq (liver/gb) · 14 of 55 slices shown]
[im 1/55]
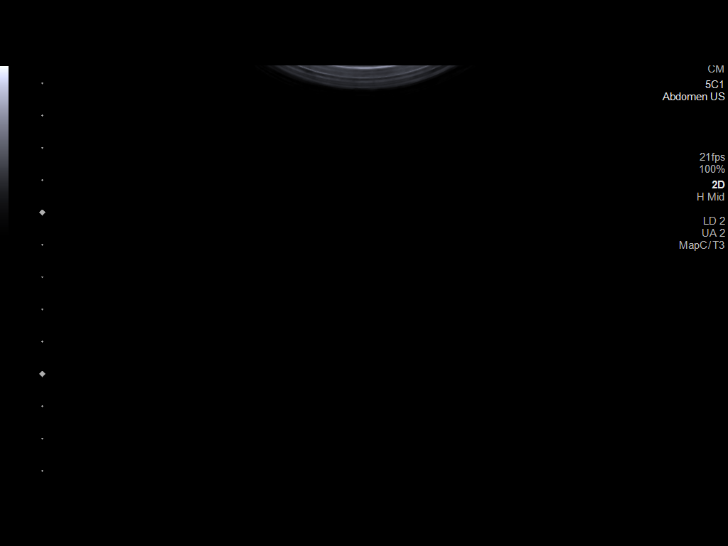
[im 5/55]
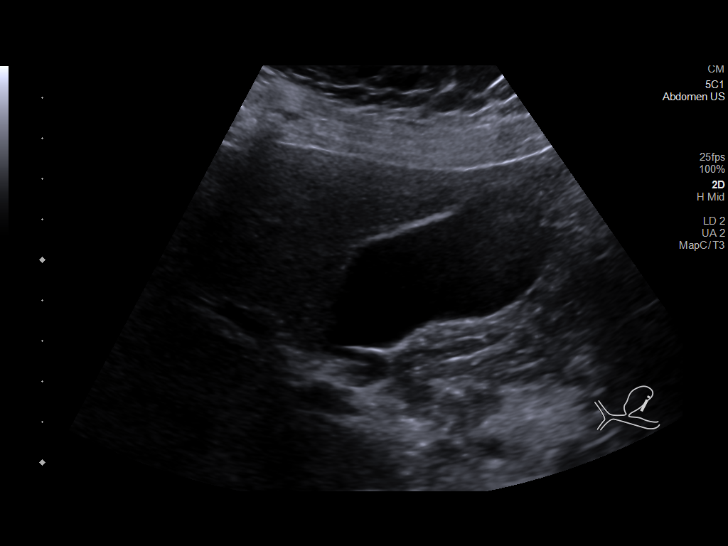
[im 10/55]
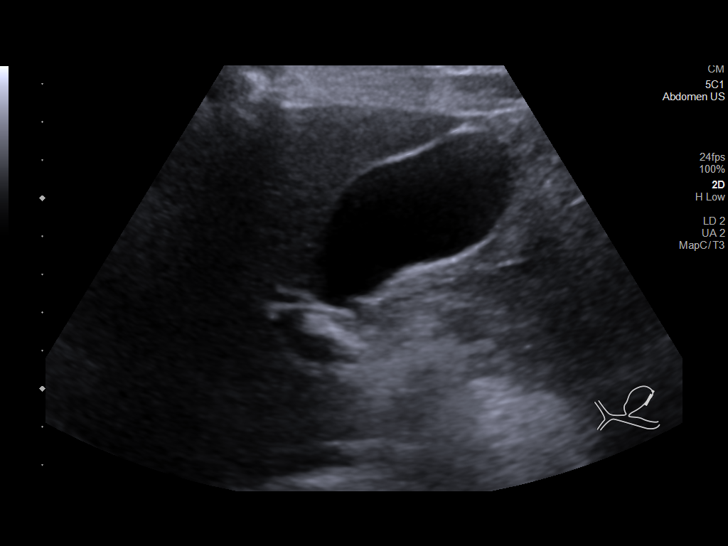
[im 14/55]
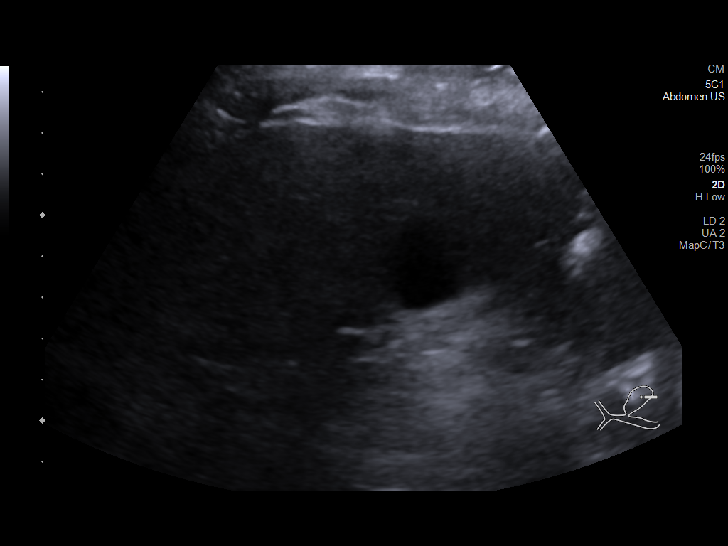
[im 19/55]
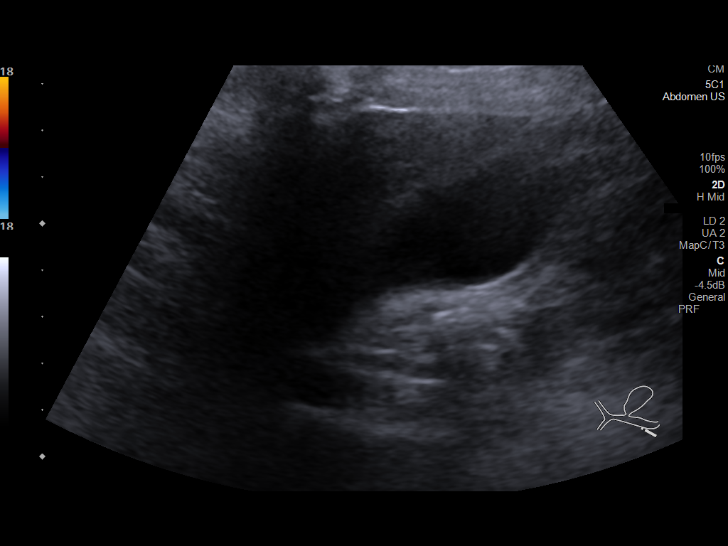
[im 21/55]
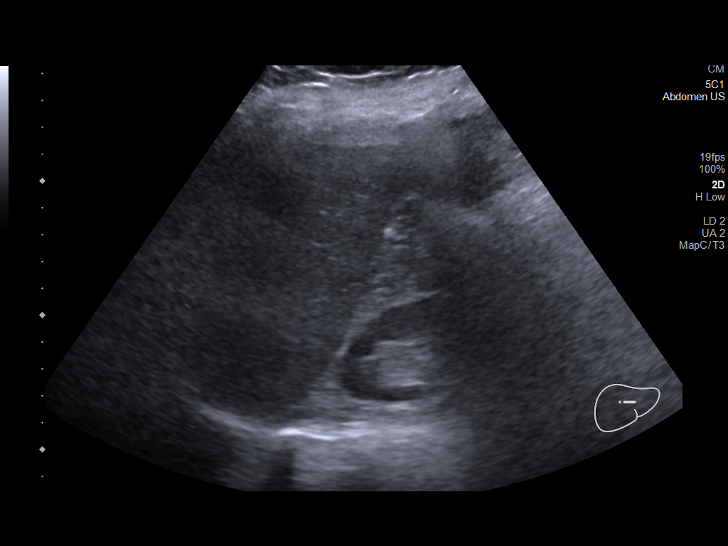
[im 25/55]
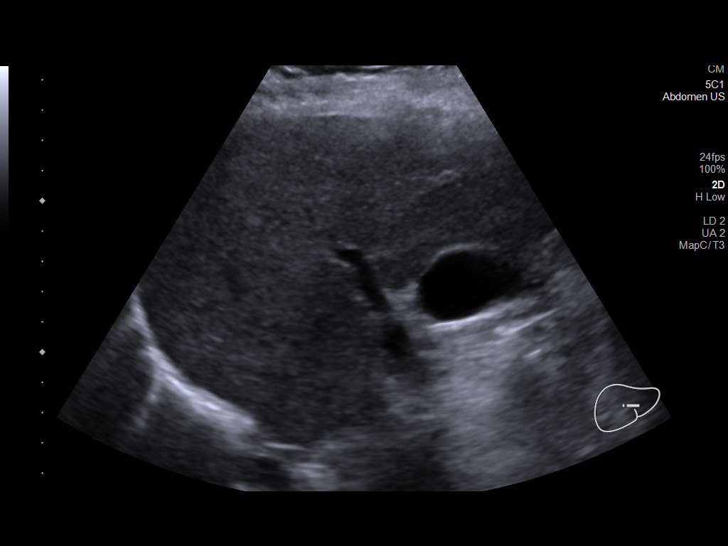
[im 30/55]
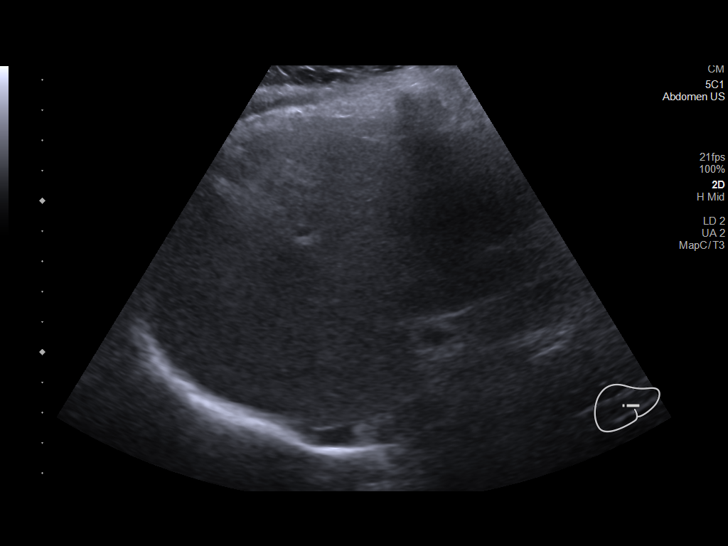
[im 34/55]
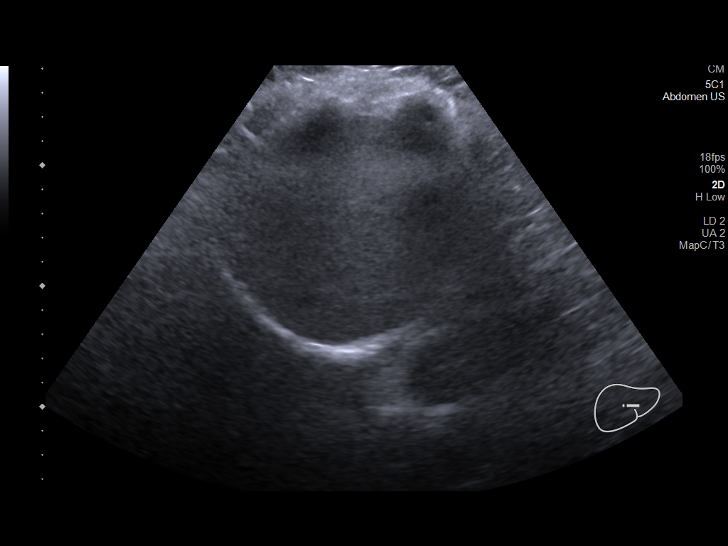
[im 37/55]
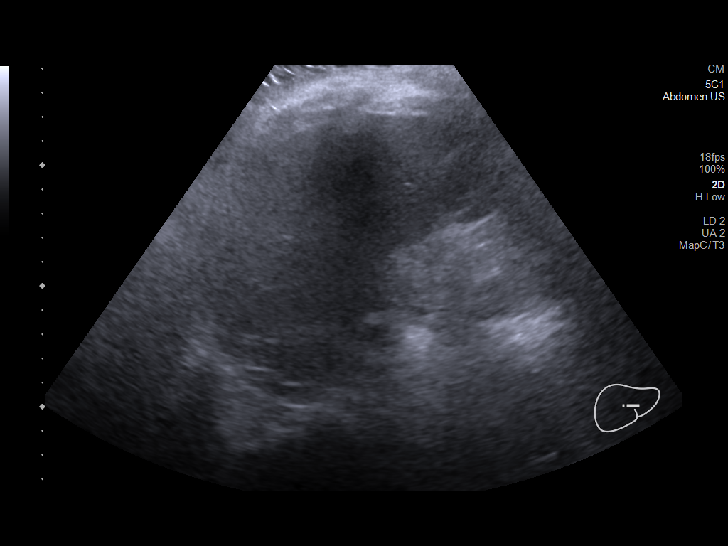
[im 41/55]
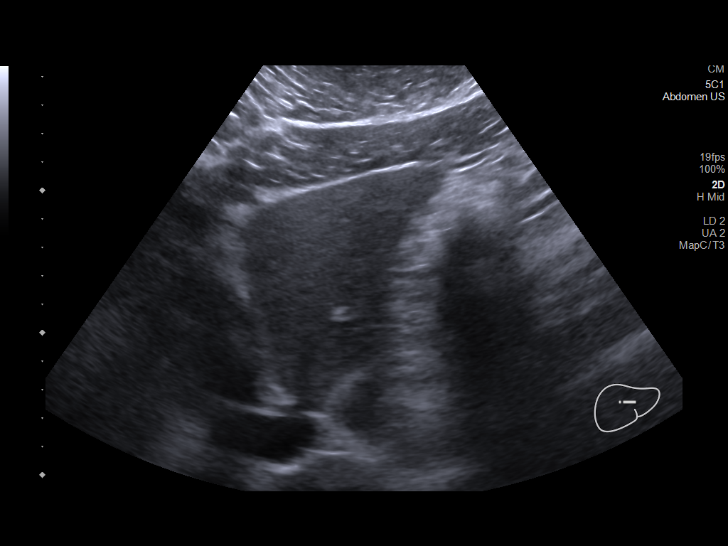
[im 46/55]
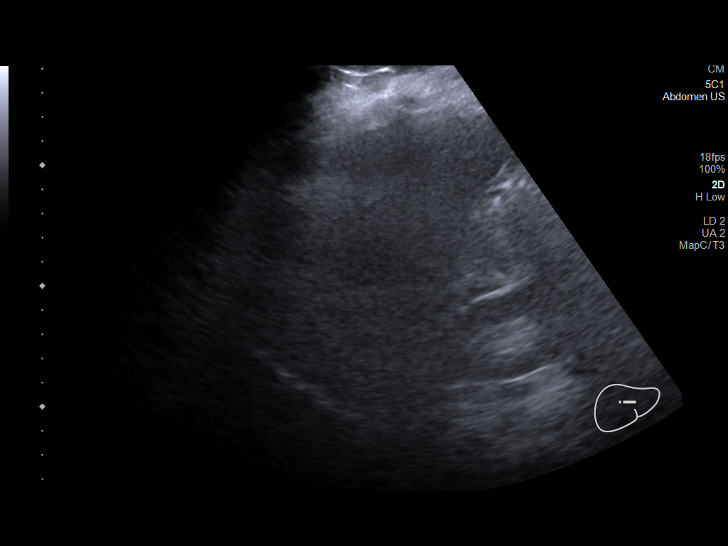
[im 50/55]
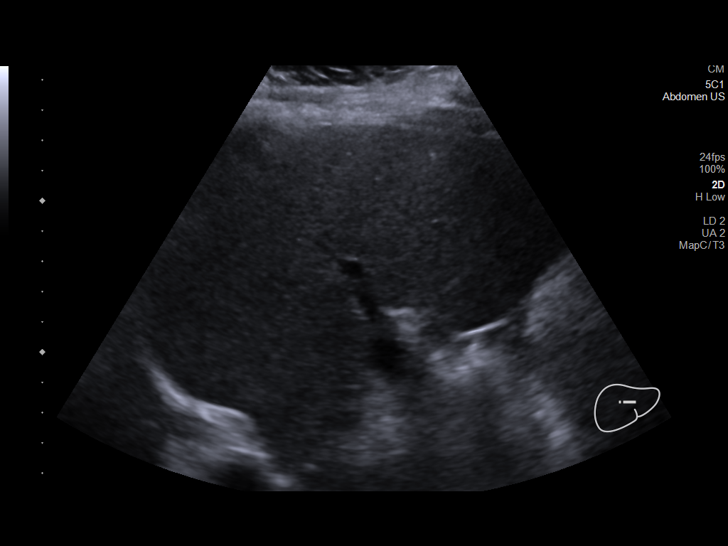
[im 55/55]
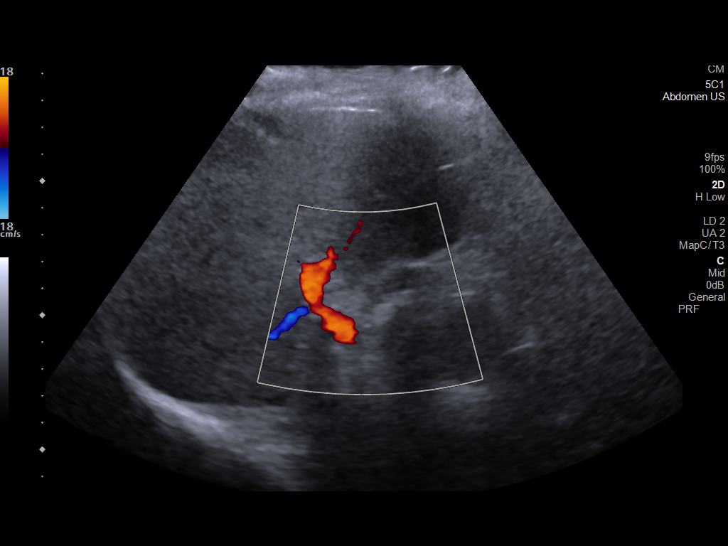

[14 of 25 positions shown; findings below may reference images not displayed]

FINDINGS: Gallbladder:

No gallstones or wall thickening visualized. No sonographic Murphy
sign noted by sonographer.

Common bile duct:

Diameter: Normal, 3 mm.

Liver:

Mildly increased hepatic echogenicity, without definitive evidence
of cirrhosis. No focal liver lesion. Portal vein is patent on color
Doppler imaging with normal direction of blood flow towards the
liver.

Other: None.
IMPRESSION: No evidence of hepatocellular carcinoma. Mildly increased hepatic
echogenicity is favored to represent steatosis.

## 2023-06-18 ENCOUNTER — Other Ambulatory Visit (HOSPITAL_COMMUNITY): Payer: Self-pay

## 2023-06-20 ENCOUNTER — Other Ambulatory Visit (HOSPITAL_COMMUNITY): Payer: Self-pay

## 2023-06-21 ENCOUNTER — Other Ambulatory Visit (HOSPITAL_COMMUNITY): Payer: Self-pay

## 2023-06-24 ENCOUNTER — Telehealth: Payer: Self-pay | Admitting: Pharmacy Technician

## 2023-06-24 NOTE — Telephone Encounter (Signed)
[facility] Patient Advocate Encounter  Patient's medication, Laura Jones has been couriered to [facility] from [facility] and will be administered at pt's next visit  07/01/23.

## 2023-07-01 ENCOUNTER — Other Ambulatory Visit: Payer: Self-pay

## 2023-07-01 ENCOUNTER — Ambulatory Visit: Payer: Medicare Other | Admitting: Pharmacist

## 2023-07-01 DIAGNOSIS — Z21 Asymptomatic human immunodeficiency virus [HIV] infection status: Secondary | ICD-10-CM | POA: Diagnosis not present

## 2023-07-01 MED ORDER — CABOTEGRAVIR & RILPIVIRINE ER 600 & 900 MG/3ML IM SUER
1.0000 | Freq: Once | INTRAMUSCULAR | Status: AC
Start: 2023-07-01 — End: 2023-07-01
  Administered 2023-07-01: 1 via INTRAMUSCULAR

## 2023-07-01 NOTE — Progress Notes (Signed)
HPI: Laura Jones is a 52 y.o. female who presents to the [facility] pharmacy clinic for [place] administration.  Patient Active Problem List   Diagnosis Date Noted   Elevated serum creatinine 05/30/2020   Anemia 05/30/2020   Hepatic cirrhosis (HCC) 05/27/2019   Closed bimalleolar fracture of left ankle 02/03/2019   VIN III (vulvar intraepithelial neoplasia III) 10/14/2018   OA (osteoarthritis) of hip 06/18/2018   Hypercholesteremia 12/08/2017   History of total right hip replacement 09/18/2017   Hepatitis C virus infection cured after antiviral drug therapy 07/30/2017   Acid reflux 07/30/2017   HIV (human immunodeficiency virus infection) (HCC) 07/29/2017   Cystocele, lateral 07/25/2016   Avascular necrosis of bone of hip, left (HCC) 09/13/2015   Erythropoietic porphyria (HCC) 09/13/2015   Depression 07/01/2015    Patient's Medications  New Prescriptions   No medications on file  Previous Medications   ALBUTEROL (VENTOLIN HFA) 108 (90 BASE) MCG/ACT INHALER    SMARTSIG:1 Puff(s) Via Inhaler 4 Times Daily PRN   AMLODIPINE (NORVASC) 5 MG TABLET    Take 5 mg by mouth daily.   ATORVASTATIN (LIPITOR) 10 MG TABLET    Take 10 mg by mouth at bedtime.   BUPROPION (ZYBAN) 150 MG 12 HR TABLET    Take 150 mg by mouth 2 (two) times daily.   BUSPIRONE (BUSPAR) 15 MG TABLET    Take 15 mg by mouth 2 (two) times daily.   CABOTEGRAVIR & RILPIVIRINE ER (CABENUVA) 600 & 900 MG/3ML INJECTION    Inject 1 kit into the muscle every 2 (two) months.   CHOLECALCIFEROL (VITAMIN D) 50 MCG (2000 UT) TABLET    Take 4,000 Units by mouth daily.    CITALOPRAM (CELEXA) 40 MG TABLET    Take 40 mg by mouth daily.   DEXLANSOPRAZOLE (DEXILANT) 60 MG CAPSULE    Take 60 mg by mouth daily.   DICYCLOMINE (BENTYL) 10 MG CAPSULE    Take 10 mg by mouth 2 (two) times daily.    DIPHENHYDRAMINE-ACETAMINOPHEN (TYLENOL PM) 25-500 MG TABS TABLET    Take 1 tablet by mouth daily as needed.   DOCUSATE SODIUM (COLACE) 100 MG CAPSULE     Take 100 mg by mouth daily.   IBANDRONATE (BONIVA) 150 MG TABLET    Take 150 mg by mouth every 30 (thirty) days.   LOPERAMIDE (IMODIUM A-D) 2 MG TABLET    Take 2 mg by mouth as needed for diarrhea or loose stools.   METOPROLOL TARTRATE (LOPRESSOR) 25 MG TABLET    Take 25 mg by mouth daily.   ONDANSETRON (ZOFRAN-ODT) 8 MG DISINTEGRATING TABLET    TAKE 1 TABLET BY MOUTH EVERY 8 HOURS AS NEEDED FOR NAUSEA AND VOMITING   SENNA (SENOKOT) 8.6 MG TABLET    Take 1 tablet by mouth daily.   TIZANIDINE (ZANAFLEX) 4 MG TABLET    Take 4 mg by mouth at bedtime.   TRAZODONE (DESYREL) 100 MG TABLET    Take 100-200 mg by mouth at bedtime.   VITAMIN B-12 (CYANOCOBALAMIN) 500 MCG TABLET    Take 500 mcg by mouth daily. Two tablets at night  Modified Medications   No medications on file  Discontinued Medications   No medications on file    Allergies: Allergies  Allergen Reactions   Levaquin [Levofloxacin] Rash    Difficulty breathing, mouth swelling/rash   Fish Oil Nausea And Vomiting   Compazine [Prochlorperazine Edisylate]     Muscle twitching   Droperidol     uncontrolled  muscle movements    Imitrex [Sumatriptan]     Difficulty breathing, chest pain; All "triptans"   Promethazine    Reglan [Metoclopramide]     Uncontrolled twitching   Sulfa Antibiotics Hives    Hives    Tagamet (Premixed [Cimetidine]    Thorazine [Chlorpromazine]     Uncontrolled muscle twitching    Past Medical History: Past Medical History:  Diagnosis Date   Anemia    hx of    Anxiety    Arthritis    left knee   Avascular necrosis of bone of hip, left (HCC)    Bulging lumbar disc    Depression    Dyspnea    with excertion   GERD (gastroesophageal reflux disease)    Hepatitis    C treated 2 years ago went undetected   History of kidney stones    HIV (human immunodeficiency virus infection) (HCC) 07/29/2017   HIV infection (HCC)    Insomnia    Migraines    Nerve pain    Perimenopausal    Porphyria (HCC)     PTSD (post-traumatic stress disorder)    Spinal stenosis of lumbosacral region    Tendonitis    right arm    Trauma     Social History: Social History   Socioeconomic History   Marital status: Divorced    Spouse name: Not on file   Number of children: Not on file   Years of education: Not on file   Highest education level: Not on file  Occupational History   Not on file  Tobacco Use   Smoking status: Never   Smokeless tobacco: Never  Vaping Use   Vaping Use: Never used  Substance and Sexual Activity   Alcohol use: No   Drug use: No   Sexual activity: Not Currently    Birth control/protection: Surgical    Comment: declined condoms (hyst)  Other Topics Concern   Not on file  Social History Narrative   Not on file   Social Determinants of Health   Financial Resource Strain: Not on file  Food Insecurity: Not on file  Transportation Needs: Not on file  Physical Activity: Not on file  Stress: Not on file  Social Connections: Not on file    Labs: Lab Results  Component Value Date   HIV1RNAQUANT Not Detected 05/02/2023   HIV1RNAQUANT <20 (H) 03/12/2023   HIV1RNAQUANT Not Detected 11/08/2022   CD4TABS 470 05/02/2023   CD4TABS 350 (L) 11/08/2022   CD4TABS 758 12/29/2020    RPR and STI Lab Results  Component Value Date   LABRPR NON-REACTIVE 05/10/2020    STI Results GC CT  05/10/2020 11:08 AM Negative  Negative     Hepatitis B Lab Results  Component Value Date   HEPBSAB REACTIVE (A) 03/12/2023   HEPBSAG NON-REACTIVE 07/29/2017   Hepatitis C No results found for: "HEPCAB", "HCVRNAPCRQN" Hepatitis A Lab Results  Component Value Date   HAV REACTIVE (A) 07/29/2017   Lipids: Lab Results  Component Value Date   CHOL 185 05/02/2023   TRIG 336 (H) 05/02/2023   HDL 45 (L) 05/02/2023   CHOLHDL 4.1 05/02/2023   VLDL 65 (H) 07/29/2017   LDLCALC 95 05/02/2023    TARGET DATE: The 14th  Assessment: Laura Jones presents today for her maintenance  Cabenuva injections. Past injections were tolerated well without issues except for minor itching at the injection site. No other complaints. Last HIV RNA was undetectable in May and CD4 count was up to 470.  Will defer labs today.  Administered cabotegravir 600mg /57mL in left upper outer quadrant of the gluteal muscle. Administered rilpivirine 900 mg/76mL in the right upper outer quadrant of the gluteal muscle. No issues with injections. She will follow up in 2 months for next set of injections.  Plan: - Cabenuva injections administered - Next injections scheduled for 09/03/23 with me and 11/05/23 with Judeth Cornfield - Call with any issues or questions  Garry Bochicchio L. Teren Zurcher, PharmD, BCIDP, AAHIVP, CPP Clinical Pharmacist Practitioner Infectious Diseases Clinical Pharmacist [facility]

## 2023-07-04 DIAGNOSIS — E7849 Other hyperlipidemia: Secondary | ICD-10-CM | POA: Diagnosis not present

## 2023-07-04 DIAGNOSIS — R1310 Dysphagia, unspecified: Secondary | ICD-10-CM | POA: Diagnosis not present

## 2023-07-04 DIAGNOSIS — N182 Chronic kidney disease, stage 2 (mild): Secondary | ICD-10-CM | POA: Diagnosis not present

## 2023-07-04 DIAGNOSIS — K219 Gastro-esophageal reflux disease without esophagitis: Secondary | ICD-10-CM | POA: Diagnosis not present

## 2023-07-04 DIAGNOSIS — I10 Essential (primary) hypertension: Secondary | ICD-10-CM | POA: Diagnosis not present

## 2023-07-11 ENCOUNTER — Encounter (INDEPENDENT_AMBULATORY_CARE_PROVIDER_SITE_OTHER): Payer: Self-pay | Admitting: *Deleted

## 2023-07-25 ENCOUNTER — Encounter (INDEPENDENT_AMBULATORY_CARE_PROVIDER_SITE_OTHER): Payer: Self-pay | Admitting: Gastroenterology

## 2023-07-25 ENCOUNTER — Ambulatory Visit (INDEPENDENT_AMBULATORY_CARE_PROVIDER_SITE_OTHER): Payer: Medicare Other | Admitting: Gastroenterology

## 2023-07-25 VITALS — BP 104/70 | HR 61 | Temp 98.1°F | Ht 61.0 in | Wt 182.5 lb

## 2023-07-25 DIAGNOSIS — R1310 Dysphagia, unspecified: Secondary | ICD-10-CM

## 2023-07-25 DIAGNOSIS — K7400 Hepatic fibrosis, unspecified: Secondary | ICD-10-CM | POA: Diagnosis not present

## 2023-07-25 DIAGNOSIS — Z09 Encounter for follow-up examination after completed treatment for conditions other than malignant neoplasm: Secondary | ICD-10-CM | POA: Diagnosis not present

## 2023-07-25 DIAGNOSIS — R1319 Other dysphagia: Secondary | ICD-10-CM

## 2023-07-25 DIAGNOSIS — Z8619 Personal history of other infectious and parasitic diseases: Secondary | ICD-10-CM | POA: Diagnosis not present

## 2023-07-25 DIAGNOSIS — K219 Gastro-esophageal reflux disease without esophagitis: Secondary | ICD-10-CM

## 2023-07-25 NOTE — Patient Instructions (Addendum)
Schedule EGD  Will need repeat US of the liver/elastography in October 2024 Will reques most recent colonoscopy report

## 2023-07-25 NOTE — H&P (View-Only) (Signed)
Laura Jones, M.D. Gastroenterology & Hepatology [facility] [address] [place], [place] [address] Primary Care Physician: Toma Deiters, MD [address] [place] [address]  Referring MD: PCP  Chief Complaint:  diarrhea, history of hepatitis C  History of Present Illness: Laura Jones is a 52 y.o. female with past medical history of HIV, HCV status post treatment failure with combination of pegasys/RBV status post open treatment with SVR, advanced liver fibrosis secondary to HCV, who presents for evaluation of diarrhea and history of hepatitis C.  Patient reports that for the last 6 months she has presented recurrent episodes of choking when she eats any kind of food or pills. She states having worsening of these symptoms recently. She will need  to vomit the food sometimes as it may not go down. She reports having persistent heartburn despite using Dexilant 60 mg every day - she does not eat breakfast. She was recently started on famotidine at ngiht by her PCP, has not noticed too much of a difference. Has some odynophagia.  She stays nauseated frequently and occasionally vomits even if she is not eating. The patient denies having any nausea, fever, chills, hematochezia, melena, hematemesis, abdominal distention, abdominal pain, diarrhea, jaundice, pruritus or weight loss.  States she has been on Dexilant since 2018 for GERD.  Patient was previously seen at [facility] by Dr. Vira Agar.  Last time seen in the office was in 2016.  She had a normal HIDA scan.  There was suspicion of an esophageal ulcer.  Unfortunately, I cannot pull up the report of her most recent EGD.  Notably, she had liver biopsy performed in 2003 that showed early cirrhosis per patient report.  However, she had further testing with elastography showed F2/F3 fibrosis.  She has been following with the infectious disease clinic for her advanced liver  fibrosis.  Most recent blood workup from 05/02/2023 available showed AST of 14, ALT 9, creatinine 1.17, BUN 10, normal electrolytes, WBC 4.6, hemoglobin 11.7, platelets 246.  Patient is currently following with the HIV clinic in [facility].  Reportedly had treatment in the past for hep C and achieved SVR in 2018.  Currently on treatment with Cabenuva.  Last viral load on 03/12/2023 was negative.  Last liver US was on 03/29/2023 - no masses.  Last EGD: back in 2016, patient reports she may have had ulcers but it is unclear. Last Colonoscopy: reports performed a few years ago by Dr. Aurea Graff, no report available  Prior to this, she had an incomplete colonoscopy due to poor prep on 12/16/2019.  FHx: neg for any gastrointestinal/liver disease, no malignancies Social: neg smoking, alcohol or illicit drug use Surgical: c-section, hysterectomy and tubal ligation  Past Medical History: Past Medical History:  Diagnosis Date   Anemia    hx of    Anxiety    Arthritis    left knee   Avascular necrosis of bone of hip, left (HCC)    Bulging lumbar disc    Depression    Dyspnea    with excertion   GERD (gastroesophageal reflux disease)    Hepatitis    C treated 2 years ago went undetected   History of kidney stones    HIV (human immunodeficiency virus infection) (HCC) 07/29/2017   HIV infection (HCC)    Insomnia    Migraines    Nerve pain    Perimenopausal    Porphyria (HCC)    PTSD (post-traumatic stress disorder)  Spinal stenosis of lumbosacral region    Tendonitis    right arm    Trauma     Past Surgical History: Past Surgical History:  Procedure Laterality Date   ABDOMINAL HYSTERECTOMY     ANKLE SURGERY Left 01/26/2019   CESAREAN SECTION     ESOPHAGOGASTRODUODENOSCOPY     x2   INCONTINENCE SURGERY     JOINT REPLACEMENT     left hip replacement Dr. Lequita Halt 06-18-18   TOTAL HIP ARTHROPLASTY Right 02/2013   TOTAL HIP ARTHROPLASTY Left 06/18/2018   Procedure: LEFT TOTAL HIP  ARTHROPLASTY ANTERIOR APPROACH;  Surgeon: Ollen Gross, MD;  Location: [facility];  Service: Orthopedics;  Laterality: Left;   TUBAL LIGATION     URETHRAL SLING     VULVECTOMY PARTIAL N/A 08/19/2019   Procedure: VULVECTOMY PARTIAL;  Surgeon: Lazaro Arms, MD;  Location: [facility];  Service: Gynecology;  Laterality: N/A;    Family History: Family History  Problem Relation Age of Onset   COPD Mother    Osteoporosis Mother    Hypertension Father    Healthy Sister    Heart disease Maternal Grandmother    Heart disease Maternal Grandfather     Social History: Social History   Tobacco Use  Smoking Status Never   Passive exposure: Past  Smokeless Tobacco Never   Social History   Substance and Sexual Activity  Alcohol Use No   Social History   Substance and Sexual Activity  Drug Use No    Allergies: Allergies  Allergen Reactions   Levaquin [Levofloxacin] Rash    Difficulty breathing, mouth swelling/rash   Compazine [Prochlorperazine Edisylate]     Muscle twitching   Droperidol     uncontrolled muscle movements    Imitrex [Sumatriptan]     Difficulty breathing, chest pain; All "triptans"   Promethazine    Reglan [Metoclopramide]     Uncontrolled twitching   Sulfa Antibiotics Hives    Hives    Tagamet (Premixed [Cimetidine]    Thorazine [Chlorpromazine]     Uncontrolled muscle twitching    Medications: Current Outpatient Medications  Medication Sig Dispense Refill   albuterol (VENTOLIN HFA) 108 (90 Base) MCG/ACT inhaler SMARTSIG:1 Puff(s) Via Inhaler 4 Times Daily PRN     amLODipine (NORVASC) 5 MG tablet Take 5 mg by mouth daily.     atorvastatin (LIPITOR) 10 MG tablet Take 10 mg by mouth at bedtime.  0   buPROPion (ZYBAN) 150 MG 12 hr tablet Take 150 mg by mouth 2 (two) times daily.     busPIRone (BUSPAR) 15 MG tablet Take 15 mg by mouth 2 (two) times daily.  0   cabotegravir & rilpivirine ER (CABENUVA) 600 & 900 MG/3ML injection Inject 1 kit into the muscle  every 2 (two) months. 6 mL 5   Cholecalciferol (VITAMIN D) 50 MCG (2000 UT) tablet Take 4,000 Units by mouth daily.      dexlansoprazole (DEXILANT) 60 MG capsule Take 60 mg by mouth daily.     dicyclomine (BENTYL) 10 MG capsule Take 10 mg by mouth 2 (two) times daily.      diphenhydrAMINE (BENADRYL ALLERGY) 25 mg capsule Take 25 mg by mouth every 6 (six) hours as needed.     diphenhydramine-acetaminophen (TYLENOL PM) 25-500 MG TABS tablet Take 1 tablet by mouth daily as needed.     famotidine (PEPCID) 40 MG tablet Take 40 mg by mouth at bedtime.     FLUoxetine (PROZAC) 40 MG capsule Take 40 mg by mouth  daily.     ibandronate (BONIVA) 150 MG tablet Take 150 mg by mouth every 30 (thirty) days.     loperamide (IMODIUM A-D) 2 MG tablet Take 2 mg by mouth as needed for diarrhea or loose stools.     metoprolol tartrate (LOPRESSOR) 25 MG tablet Take 25 mg by mouth 2 (two) times daily.     Omega-3 Fatty Acids (FISH OIL) 1200 MG CAPS Take by mouth. Takes one bid     ondansetron (ZOFRAN) 4 MG tablet Take 4 mg by mouth every 8 (eight) hours as needed for nausea or vomiting.     tiZANidine (ZANAFLEX) 4 MG tablet Take 4 mg by mouth at bedtime. As needed     traZODone (DESYREL) 100 MG tablet Take 100-200 mg by mouth at bedtime.     No current facility-administered medications for this visit.    Review of Systems: GENERAL: negative for malaise, night sweats HEENT: No changes in hearing or vision, no nose bleeds or other nasal problems. NECK: Negative for lumps, goiter, pain and significant neck swelling RESPIRATORY: Negative for cough, wheezing CARDIOVASCULAR: Negative for chest pain, leg swelling, palpitations, orthopnea GI: SEE HPI MUSCULOSKELETAL: Negative for joint pain or swelling, back pain, and muscle pain. SKIN: Negative for lesions, rash PSYCH: Negative for sleep disturbance, mood disorder and recent psychosocial stressors. HEMATOLOGY Negative for prolonged bleeding, bruising easily, and  swollen nodes. ENDOCRINE: Negative for cold or heat intolerance, polyuria, polydipsia and goiter. NEURO: negative for tremor, gait imbalance, syncope and seizures. The remainder of the review of systems is noncontributory.   Physical Exam: BP 104/70 (BP Location: Left Arm, Patient Position: Sitting, Cuff Size: Large)   Pulse 61   Temp 98.1 F (36.7 C) (Oral)   Ht 5\' 1"  (1.549 m)   Wt 182 lb 8 oz (82.8 kg)   LMP 12/25/2003   BMI 34.48 kg/m  GENERAL: The patient is AO x3, in no acute distress. HEENT: Head is normocephalic and atraumatic. EOMI are intact. Mouth is well hydrated and without lesions. NECK: Supple. No masses LUNGS: Clear to auscultation. No presence of rhonchi/wheezing/rales. Adequate chest expansion HEART: RRR, normal s1 and s2. ABDOMEN: Soft, nontender, no guarding, no peritoneal signs, and nondistended. BS +. No masses. EXTREMITIES: Without any cyanosis, clubbing, rash, lesions or edema. NEUROLOGIC: AOx3, no focal motor deficit. SKIN: no jaundice, no rashes   Imaging/Labs: as above  I personally reviewed and interpreted the available labs, imaging and endoscopic files.  Impression and Plan: Ollivia Faubert is a 52 y.o. female with past medical history of HIV, HCV status post treatment failure with combination of pegasys/RBV status post open treatment with SVR, advanced liver fibrosis secondary to HCV, who presents for evaluation of diarrhea and history of hepatitis C.  The patient has presented recurrent episodes of dysphagia which has been concerning as she has had to vomit her food sometimes.  No prior episodes of food impaction.  Differential diagnosis includes peptic strictures versus malignancy versus less likely due to dysmotility.  We will evaluate this further with an EGD.  In terms of her hepatitis C, she achieved SVR in the past with oral treatment open (unclear which treatment she received).  However, previous biopsies showed "early cirrhosis" and  elastography has showed possible F3 fibrosis.  Will keep monitoring her for risk of HCC with repeat ultrasounds every 6 months but she will need to have an elastography in October 2024 to assess fibrosis progression.  - Schedule EGD  - Will need repeat US of  the liver/elastography in October 2024 - Will request most recent colonoscopy report  ADDENDUM: I received the most recent results from the colonoscopy performed by Dr. Aurea Graff on 05/29/2020 which showed normal colon and normal retroflexion.  Quality of the bowel preparation was adequate.  She recommended to repeat colonoscopy in 10 years.  All questions were answered.      Laura Blazing, MD Gastroenterology and Hepatology [facility]

## 2023-07-25 NOTE — Progress Notes (Addendum)
Katrinka Blazing, M.D. Gastroenterology & Hepatology [facility] [address] [place], [place] [address] Primary Care Physician: Toma Deiters, MD [address] [place] [address]  Referring MD: PCP  Chief Complaint:  diarrhea, history of hepatitis C  History of Present Illness: Laura Jones is a 52 y.o. female with past medical history of HIV, HCV status post treatment failure with combination of pegasys/RBV status post open treatment with SVR, advanced liver fibrosis secondary to HCV, who presents for evaluation of diarrhea and history of hepatitis C.  Patient reports that for the last 6 months she has presented recurrent episodes of choking when she eats any kind of food or pills. She states having worsening of these symptoms recently. She will need  to vomit the food sometimes as it may not go down. She reports having persistent heartburn despite using Dexilant 60 mg every day - she does not eat breakfast. She was recently started on famotidine at ngiht by her PCP, has not noticed too much of a difference. Has some odynophagia.  She stays nauseated frequently and occasionally vomits even if she is not eating. The patient denies having any nausea, fever, chills, hematochezia, melena, hematemesis, abdominal distention, abdominal pain, diarrhea, jaundice, pruritus or weight loss.  States she has been on Dexilant since 2018 for GERD.  Patient was previously seen at [facility] by Dr. Vira Agar.  Last time seen in the office was in 2016.  She had a normal HIDA scan.  There was suspicion of an esophageal ulcer.  Unfortunately, I cannot pull up the report of her most recent EGD.  Notably, she had liver biopsy performed in 2003 that showed early cirrhosis per patient report.  However, she had further testing with elastography showed F2/F3 fibrosis.  She has been following with the infectious disease clinic for her advanced liver  fibrosis.  Most recent blood workup from 05/02/2023 available showed AST of 14, ALT 9, creatinine 1.17, BUN 10, normal electrolytes, WBC 4.6, hemoglobin 11.7, platelets 246.  Patient is currently following with the HIV clinic in [facility].  Reportedly had treatment in the past for hep C and achieved SVR in 2018.  Currently on treatment with Cabenuva.  Last viral load on 03/12/2023 was negative.  Last liver US was on 03/29/2023 - no masses.  Last EGD: back in 2016, patient reports she may have had ulcers but it is unclear. Last Colonoscopy: reports performed a few years ago by Dr. Aurea Graff, no report available  Prior to this, she had an incomplete colonoscopy due to poor prep on 12/16/2019.  FHx: neg for any gastrointestinal/liver disease, no malignancies Social: neg smoking, alcohol or illicit drug use Surgical: c-section, hysterectomy and tubal ligation  Past Medical History: Past Medical History:  Diagnosis Date   Anemia    hx of    Anxiety    Arthritis    left knee   Avascular necrosis of bone of hip, left (HCC)    Bulging lumbar disc    Depression    Dyspnea    with excertion   GERD (gastroesophageal reflux disease)    Hepatitis    C treated 2 years ago went undetected   History of kidney stones    HIV (human immunodeficiency virus infection) (HCC) 07/29/2017   HIV infection (HCC)    Insomnia    Migraines    Nerve pain    Perimenopausal    Porphyria (HCC)    PTSD (post-traumatic stress disorder)  Spinal stenosis of lumbosacral region    Tendonitis    right arm    Trauma     Past Surgical History: Past Surgical History:  Procedure Laterality Date   ABDOMINAL HYSTERECTOMY     ANKLE SURGERY Left 01/26/2019   CESAREAN SECTION     ESOPHAGOGASTRODUODENOSCOPY     x2   INCONTINENCE SURGERY     JOINT REPLACEMENT     left hip replacement Dr. Lequita Halt 06-18-18   TOTAL HIP ARTHROPLASTY Right 02/2013   TOTAL HIP ARTHROPLASTY Left 06/18/2018   Procedure: LEFT TOTAL HIP  ARTHROPLASTY ANTERIOR APPROACH;  Surgeon: Ollen Gross, MD;  Location: [facility];  Service: Orthopedics;  Laterality: Left;   TUBAL LIGATION     URETHRAL SLING     VULVECTOMY PARTIAL N/A 08/19/2019   Procedure: VULVECTOMY PARTIAL;  Surgeon: Lazaro Arms, MD;  Location: [facility];  Service: Gynecology;  Laterality: N/A;    Family History: Family History  Problem Relation Age of Onset   COPD Mother    Osteoporosis Mother    Hypertension Father    Healthy Sister    Heart disease Maternal Grandmother    Heart disease Maternal Grandfather     Social History: Social History   Tobacco Use  Smoking Status Never   Passive exposure: Past  Smokeless Tobacco Never   Social History   Substance and Sexual Activity  Alcohol Use No   Social History   Substance and Sexual Activity  Drug Use No    Allergies: Allergies  Allergen Reactions   Levaquin [Levofloxacin] Rash    Difficulty breathing, mouth swelling/rash   Compazine [Prochlorperazine Edisylate]     Muscle twitching   Droperidol     uncontrolled muscle movements    Imitrex [Sumatriptan]     Difficulty breathing, chest pain; All "triptans"   Promethazine    Reglan [Metoclopramide]     Uncontrolled twitching   Sulfa Antibiotics Hives    Hives    Tagamet (Premixed [Cimetidine]    Thorazine [Chlorpromazine]     Uncontrolled muscle twitching    Medications: Current Outpatient Medications  Medication Sig Dispense Refill   albuterol (VENTOLIN HFA) 108 (90 Base) MCG/ACT inhaler SMARTSIG:1 Puff(s) Via Inhaler 4 Times Daily PRN     amLODipine (NORVASC) 5 MG tablet Take 5 mg by mouth daily.     atorvastatin (LIPITOR) 10 MG tablet Take 10 mg by mouth at bedtime.  0   buPROPion (ZYBAN) 150 MG 12 hr tablet Take 150 mg by mouth 2 (two) times daily.     busPIRone (BUSPAR) 15 MG tablet Take 15 mg by mouth 2 (two) times daily.  0   cabotegravir & rilpivirine ER (CABENUVA) 600 & 900 MG/3ML injection Inject 1 kit into the muscle  every 2 (two) months. 6 mL 5   Cholecalciferol (VITAMIN D) 50 MCG (2000 UT) tablet Take 4,000 Units by mouth daily.      dexlansoprazole (DEXILANT) 60 MG capsule Take 60 mg by mouth daily.     dicyclomine (BENTYL) 10 MG capsule Take 10 mg by mouth 2 (two) times daily.      diphenhydrAMINE (BENADRYL ALLERGY) 25 mg capsule Take 25 mg by mouth every 6 (six) hours as needed.     diphenhydramine-acetaminophen (TYLENOL PM) 25-500 MG TABS tablet Take 1 tablet by mouth daily as needed.     famotidine (PEPCID) 40 MG tablet Take 40 mg by mouth at bedtime.     FLUoxetine (PROZAC) 40 MG capsule Take 40 mg by mouth  daily.     ibandronate (BONIVA) 150 MG tablet Take 150 mg by mouth every 30 (thirty) days.     loperamide (IMODIUM A-D) 2 MG tablet Take 2 mg by mouth as needed for diarrhea or loose stools.     metoprolol tartrate (LOPRESSOR) 25 MG tablet Take 25 mg by mouth 2 (two) times daily.     Omega-3 Fatty Acids (FISH OIL) 1200 MG CAPS Take by mouth. Takes one bid     ondansetron (ZOFRAN) 4 MG tablet Take 4 mg by mouth every 8 (eight) hours as needed for nausea or vomiting.     tiZANidine (ZANAFLEX) 4 MG tablet Take 4 mg by mouth at bedtime. As needed     traZODone (DESYREL) 100 MG tablet Take 100-200 mg by mouth at bedtime.     No current facility-administered medications for this visit.    Review of Systems: GENERAL: negative for malaise, night sweats HEENT: No changes in hearing or vision, no nose bleeds or other nasal problems. NECK: Negative for lumps, goiter, pain and significant neck swelling RESPIRATORY: Negative for cough, wheezing CARDIOVASCULAR: Negative for chest pain, leg swelling, palpitations, orthopnea GI: SEE HPI MUSCULOSKELETAL: Negative for joint pain or swelling, back pain, and muscle pain. SKIN: Negative for lesions, rash PSYCH: Negative for sleep disturbance, mood disorder and recent psychosocial stressors. HEMATOLOGY Negative for prolonged bleeding, bruising easily, and  swollen nodes. ENDOCRINE: Negative for cold or heat intolerance, polyuria, polydipsia and goiter. NEURO: negative for tremor, gait imbalance, syncope and seizures. The remainder of the review of systems is noncontributory.   Physical Exam: BP 104/70 (BP Location: Left Arm, Patient Position: Sitting, Cuff Size: Large)   Pulse 61   Temp 98.1 F (36.7 C) (Oral)   Ht 5\' 1"  (1.549 m)   Wt 182 lb 8 oz (82.8 kg)   LMP 12/25/2003   BMI 34.48 kg/m  GENERAL: The patient is AO x3, in no acute distress. HEENT: Head is normocephalic and atraumatic. EOMI are intact. Mouth is well hydrated and without lesions. NECK: Supple. No masses LUNGS: Clear to auscultation. No presence of rhonchi/wheezing/rales. Adequate chest expansion HEART: RRR, normal s1 and s2. ABDOMEN: Soft, nontender, no guarding, no peritoneal signs, and nondistended. BS +. No masses. EXTREMITIES: Without any cyanosis, clubbing, rash, lesions or edema. NEUROLOGIC: AOx3, no focal motor deficit. SKIN: no jaundice, no rashes   Imaging/Labs: as above  I personally reviewed and interpreted the available labs, imaging and endoscopic files.  Impression and Plan: Laura Jones is a 52 y.o. female with past medical history of HIV, HCV status post treatment failure with combination of pegasys/RBV status post open treatment with SVR, advanced liver fibrosis secondary to HCV, who presents for evaluation of diarrhea and history of hepatitis C.  The patient has presented recurrent episodes of dysphagia which has been concerning as she has had to vomit her food sometimes.  No prior episodes of food impaction.  Differential diagnosis includes peptic strictures versus malignancy versus less likely due to dysmotility.  We will evaluate this further with an EGD.  In terms of her hepatitis C, she achieved SVR in the past with oral treatment open (unclear which treatment she received).  However, previous biopsies showed "early cirrhosis" and  elastography has showed possible F3 fibrosis.  Will keep monitoring her for risk of HCC with repeat ultrasounds every 6 months but she will need to have an elastography in October 2024 to assess fibrosis progression.  - Schedule EGD  - Will need repeat US of  the liver/elastography in October 2024 - Will request most recent colonoscopy report  ADDENDUM: I received the most recent results from the colonoscopy performed by Dr. Aurea Graff on 05/29/2020 which showed normal colon and normal retroflexion.  Quality of the bowel preparation was adequate.  She recommended to repeat colonoscopy in 10 years.  All questions were answered.      Katrinka Blazing, MD Gastroenterology and Hepatology [facility]

## 2023-08-14 ENCOUNTER — Ambulatory Visit (HOSPITAL_BASED_OUTPATIENT_CLINIC_OR_DEPARTMENT_OTHER): Payer: Medicare Other | Admitting: Certified Registered"

## 2023-08-14 ENCOUNTER — Encounter (HOSPITAL_COMMUNITY)
Admission: RE | Disposition: A | Discharge status: Home / Self Care | Payer: Self-pay | Source: Ambulatory Visit | Attending: Gastroenterology

## 2023-08-14 ENCOUNTER — Other Ambulatory Visit: Payer: Self-pay

## 2023-08-14 ENCOUNTER — Ambulatory Visit (HOSPITAL_COMMUNITY)
Admission: RE | Admit: 2023-08-14 | Discharge: 2023-08-14 | Disposition: A | Discharge status: Home / Self Care | Payer: Medicare Other | Source: Ambulatory Visit | Attending: Gastroenterology | Admitting: Gastroenterology

## 2023-08-14 ENCOUNTER — Encounter (HOSPITAL_COMMUNITY): Payer: Self-pay | Admitting: Gastroenterology

## 2023-08-14 ENCOUNTER — Ambulatory Visit (HOSPITAL_COMMUNITY): Payer: Medicare Other | Admitting: Certified Registered"

## 2023-08-14 DIAGNOSIS — F32A Depression, unspecified: Secondary | ICD-10-CM

## 2023-08-14 DIAGNOSIS — Z21 Asymptomatic human immunodeficiency virus [HIV] infection status: Secondary | ICD-10-CM | POA: Diagnosis not present

## 2023-08-14 DIAGNOSIS — R1310 Dysphagia, unspecified: Secondary | ICD-10-CM

## 2023-08-14 DIAGNOSIS — K317 Polyp of stomach and duodenum: Secondary | ICD-10-CM | POA: Insufficient documentation

## 2023-08-14 DIAGNOSIS — K219 Gastro-esophageal reflux disease without esophagitis: Secondary | ICD-10-CM | POA: Diagnosis not present

## 2023-08-14 DIAGNOSIS — K449 Diaphragmatic hernia without obstruction or gangrene: Secondary | ICD-10-CM | POA: Insufficient documentation

## 2023-08-14 DIAGNOSIS — M1990 Unspecified osteoarthritis, unspecified site: Secondary | ICD-10-CM

## 2023-08-14 HISTORY — PX: ESOPHAGOGASTRODUODENOSCOPY (EGD) WITH PROPOFOL: SHX5813

## 2023-08-14 HISTORY — PX: BIOPSY: SHX5522

## 2023-08-14 HISTORY — PX: SAVORY DILATION: SHX5439

## 2023-08-14 SURGERY — ESOPHAGOGASTRODUODENOSCOPY (EGD) WITH PROPOFOL
Anesthesia: General

## 2023-08-14 MED ORDER — LIDOCAINE HCL (CARDIAC) PF 100 MG/5ML IV SOSY
PREFILLED_SYRINGE | INTRAVENOUS | Status: DC | PRN
  Administered 2023-08-14: 50 mg via INTRAVENOUS

## 2023-08-14 MED ORDER — PROPOFOL 10 MG/ML IV BOLUS
INTRAVENOUS | Status: DC | PRN
  Administered 2023-08-14: 100 mg via INTRAVENOUS
  Administered 2023-08-14: 50 mg via INTRAVENOUS

## 2023-08-14 MED ORDER — LACTATED RINGERS IV SOLN: INTRAVENOUS | Status: DC

## 2023-08-14 MED ORDER — LACTATED RINGERS IV SOLN
INTRAVENOUS | Status: DC | PRN
Start: 2023-08-14 — End: 2023-08-14

## 2023-08-14 MED ORDER — PROPOFOL 500 MG/50ML IV EMUL
INTRAVENOUS | Status: DC | PRN
  Administered 2023-08-14: 150 ug/kg/min via INTRAVENOUS

## 2023-08-14 NOTE — Transfer of Care (Signed)
Immediate Anesthesia Transfer of Care Note  Patient: Laura Jones  Procedure(s) Performed: ESOPHAGOGASTRODUODENOSCOPY (EGD) WITH PROPOFOL BIOPSY SAVORY DILATION  Patient Location: Endoscopy Unit  Anesthesia Type:General  Level of Consciousness: drowsy  Airway & Oxygen Therapy: Patient Spontanous Breathing  Post-op Assessment: Report given to RN and Post -op Vital signs reviewed and stable  Post vital signs: Reviewed and stable  Last Vitals:  Vitals Value Taken Time  BP    Temp    Pulse    Resp    SpO2      Last Pain:  Vitals:   08/14/23 0737  TempSrc:   PainSc: 0-No pain      Patients Stated Pain Goal: 8 (08/14/23 0701)  Complications: No notable events documented.

## 2023-08-14 NOTE — Discharge Instructions (Addendum)
You are being discharged to home.  Resume your previous diet.  We are waiting for your pathology results.  

## 2023-08-14 NOTE — Interval H&P Note (Signed)
History and Physical Interval Note:  08/14/2023 7:16 AM  Laura Jones  has presented today for surgery, with the diagnosis of dysphagia.  The various methods of treatment have been discussed with the patient and family. After consideration of risks, benefits and other options for treatment, the patient has consented to  Procedure(s) with comments: ESOPHAGOGASTRODUODENOSCOPY (EGD) WITH PROPOFOL (N/A) - 7:30am;asa 1 as a surgical intervention.  The patient's history has been reviewed, patient examined, no change in status, stable for surgery.  I have reviewed the patient's chart and labs.  Questions were answered to the patient's satisfaction.     Katrinka Blazing [place]a

## 2023-08-14 NOTE — Anesthesia Procedure Notes (Signed)
Date/Time: 08/14/2023 7:38 AM  Performed by: Julian Reil, CRNAPre-anesthesia Checklist: Patient identified, Emergency Drugs available, Suction available and Patient being monitored Patient Re-evaluated:Patient Re-evaluated prior to induction Oxygen Delivery Method: Nasal cannula Induction Type: IV induction Placement Confirmation: positive ETCO2

## 2023-08-14 NOTE — Anesthesia Preprocedure Evaluation (Signed)
Anesthesia Evaluation  Patient identified by MRN, date of birth, ID band Patient awake    Reviewed: Allergy & Precautions, H&P , NPO status , Patient's Chart, lab work & pertinent test results, reviewed documented beta blocker date and time   Airway Mallampati: II  TM Distance: >3 FB Neck ROM: full    Dental no notable dental hx.    Pulmonary neg pulmonary ROS, shortness of breath   Pulmonary exam normal breath sounds clear to auscultation       Cardiovascular Exercise Tolerance: Good negative cardio ROS  Rhythm:regular Rate:Normal     Neuro/Psych  Headaches PSYCHIATRIC DISORDERS Anxiety Depression    negative neurological ROS  negative psych ROS   GI/Hepatic negative GI ROS, Neg liver ROS,GERD  ,,(+) Hepatitis -  Endo/Other  negative endocrine ROS    Renal/GU negative Renal ROS  negative genitourinary   Musculoskeletal   Abdominal   Peds  Hematology  (+) Blood dyscrasia, anemia , HIV  Anesthesia Other Findings   Reproductive/Obstetrics negative OB ROS                             Anesthesia Physical Anesthesia Plan  ASA: 3  Anesthesia Plan: General   Post-op Pain Management:    Induction:   PONV Risk Score and Plan: Propofol infusion  Airway Management Planned:   Additional Equipment:   Intra-op Plan:   Post-operative Plan:   Informed Consent: I have reviewed the patients History and Physical, chart, labs and discussed the procedure including the risks, benefits and alternatives for the proposed anesthesia with the patient or authorized representative who has indicated his/her understanding and acceptance.     Dental Advisory Given  Plan Discussed with: CRNA  Anesthesia Plan Comments:        Anesthesia Quick Evaluation

## 2023-08-14 NOTE — Op Note (Addendum)
[facility] Patient Name: Laura Jones Procedure Date: 08/14/2023 7:07 AM MRN: [id number] Date of Birth: 12/23/1971 Attending MD: Katrinka Blazing , , [id number] CSN: [id number] Age: 52 Admit Type: Outpatient Procedure:                Upper GI endoscopy Indications:              Dysphagia Providers:                Katrinka Blazing, Sheran Fava, Kristine L.                            Jessee Avers, Technician Referring MD:              Medicines:                Monitored Anesthesia Care Complications:            No immediate complications. Estimated Blood Loss:     Estimated blood loss: none. Procedure:                Pre-Anesthesia Assessment:                           - Prior to the procedure, a History and Physical                            was performed, and patient medications, allergies                            and sensitivities were reviewed. The patient's                            tolerance of previous anesthesia was reviewed.                           - The risks and benefits of the procedure and the                            sedation options and risks were discussed with the                            patient. All questions were answered and informed                            consent was obtained.                           - ASA Grade Assessment: II - A patient with mild                            systemic disease.                           After obtaining informed consent, the endoscope was                            passed under direct vision. Throughout the  procedure, the patient's blood pressure, pulse, and                            oxygen saturations were monitored continuously. The                            [id number]-H190 ([id number]) scope was introduced through the                            mouth, and advanced to the second part of duodenum.                            The upper GI endoscopy was accomplished without                             difficulty. The patient tolerated the procedure                            well. Scope In: 7:40:44 AM Scope Out: 7:50:49 AM Total Procedure Duration: 0 hours 10 minutes 5 seconds  Findings:      No endoscopic abnormality was evident in the esophagus to explain the       patient's complaint of dysphagia. It was decided, however, to proceed       with dilation of the entire esophagus. A guidewire was placed and the       scope was withdrawn. Dilation was performed with a Savary dilator with       no resistance at 20 mm. The dilation site was examined following       endoscope reinsertion and showed mild mucosal disruption at the UES.       Biopsies were obtained from the proximal and distal esophagus with cold       forceps for histology of eosinophilic esophagitis.      A 1 cm hiatal hernia was present.      The gastroesophageal flap valve was visualized endoscopically and       classified as Hill Grade III (minimal fold, loose to endoscope, hiatal       hernia likely).      Three 4 mm semi-sessile polyps with no stigmata of recent bleeding were       found in the gastric antrum.      The examined duodenum was normal. Impression:               - No endoscopic esophageal abnormality to explain                            patient's dysphagia. Esophagus dilated. Biopsied                           - 1 cm hiatal hernia.                           - Gastroesophageal flap valve classified as Hill                            Grade III (minimal fold, loose to endoscope, hiatal  hernia likely).                           - Three gastric polyps.                           - Normal examined duodenum. Moderate Sedation:      Per Anesthesia Care Recommendation:           - Discharge patient to home (ambulatory).                           - Resume previous diet.                           - Await pathology results.                           - If persistent  dysphagia, will proceed with                            esophagram and manometry. Procedure Code(s):        --- Professional ---                           [phone], Esophagogastroduodenoscopy, flexible,                            transoral; with insertion of guide wire followed by                            passage of dilator(s) through esophagus over guide                            wire                           43239, 59, Esophagogastroduodenoscopy, flexible,                            transoral; with biopsy, single or multiple Diagnosis Code(s):        --- Professional ---                           R13.10, Dysphagia, unspecified                           K44.9, Diaphragmatic hernia without obstruction or                            gangrene                           K31.7, Polyp of stomach and duodenum CPT copyright 2022 [employer]. All rights reserved. The codes documented in this report are preliminary and upon coder review may  be revised to meet current compliance requirements. Katrinka Blazing, MD Katrinka Blazing,  08/14/2023 8:00:23 AM This report has been signed electronically. Number of Addenda: 0

## 2023-08-15 ENCOUNTER — Other Ambulatory Visit (HOSPITAL_COMMUNITY): Payer: Self-pay

## 2023-08-15 LAB — SURGICAL PATHOLOGY

## 2023-08-15 NOTE — Anesthesia Postprocedure Evaluation (Signed)
Anesthesia Post Note  Patient: Ebany Magos  Procedure(s) Performed: ESOPHAGOGASTRODUODENOSCOPY (EGD) WITH PROPOFOL BIOPSY SAVORY DILATION  Patient location during evaluation: Phase II Anesthesia Type: General Level of consciousness: awake Pain management: pain level controlled Vital Signs Assessment: post-procedure vital signs reviewed and stable Respiratory status: spontaneous breathing and respiratory function stable Cardiovascular status: blood pressure returned to baseline and stable Postop Assessment: no headache and no apparent nausea or vomiting Anesthetic complications: no Comments: Late entry   No notable events documented.   Last Vitals:  Vitals:   08/14/23 0701 08/14/23 0754  BP: (!) 151/71 130/75  Pulse: 62 75  Resp: 12 17  Temp: 37.3 C 37.2 C  SpO2: 96% 94%    Last Pain:  Vitals:   08/14/23 0754  TempSrc: Oral  PainSc: 0-No pain                 Windell Norfolk

## 2023-08-19 ENCOUNTER — Encounter (INDEPENDENT_AMBULATORY_CARE_PROVIDER_SITE_OTHER): Payer: Self-pay | Admitting: *Deleted

## 2023-08-19 DIAGNOSIS — Z0000 Encounter for general adult medical examination without abnormal findings: Secondary | ICD-10-CM | POA: Diagnosis not present

## 2023-08-19 DIAGNOSIS — R1310 Dysphagia, unspecified: Secondary | ICD-10-CM | POA: Diagnosis not present

## 2023-08-19 DIAGNOSIS — E7849 Other hyperlipidemia: Secondary | ICD-10-CM | POA: Diagnosis not present

## 2023-08-19 DIAGNOSIS — I10 Essential (primary) hypertension: Secondary | ICD-10-CM | POA: Diagnosis not present

## 2023-08-19 DIAGNOSIS — N182 Chronic kidney disease, stage 2 (mild): Secondary | ICD-10-CM | POA: Diagnosis not present

## 2023-08-19 DIAGNOSIS — K219 Gastro-esophageal reflux disease without esophagitis: Secondary | ICD-10-CM | POA: Diagnosis not present

## 2023-08-21 ENCOUNTER — Encounter (HOSPITAL_COMMUNITY): Payer: Self-pay | Admitting: Gastroenterology

## 2023-08-29 ENCOUNTER — Telehealth: Payer: Self-pay

## 2023-08-29 NOTE — Telephone Encounter (Signed)
[facility] Patient Advocate Encounter  Patient's medication Laura Jones) have been couriered to [facility] from [employer] and will be administered on the patient next office visit on 09/03/23.  Clearance Coots , CPhT [facility] Phone: [phone] Fax:  [phone]

## 2023-09-02 DIAGNOSIS — I10 Essential (primary) hypertension: Secondary | ICD-10-CM | POA: Diagnosis not present

## 2023-09-03 ENCOUNTER — Encounter: Payer: Medicare Other | Admitting: Pharmacist

## 2023-09-05 ENCOUNTER — Ambulatory Visit (INDEPENDENT_AMBULATORY_CARE_PROVIDER_SITE_OTHER): Payer: Medicare Other | Admitting: Pharmacist

## 2023-09-05 ENCOUNTER — Other Ambulatory Visit: Payer: Self-pay

## 2023-09-05 DIAGNOSIS — Z23 Encounter for immunization: Secondary | ICD-10-CM

## 2023-09-05 DIAGNOSIS — B20 Human immunodeficiency virus [HIV] disease: Secondary | ICD-10-CM | POA: Diagnosis not present

## 2023-09-05 DIAGNOSIS — Z21 Asymptomatic human immunodeficiency virus [HIV] infection status: Secondary | ICD-10-CM

## 2023-09-05 DIAGNOSIS — R110 Nausea: Secondary | ICD-10-CM

## 2023-09-05 MED ORDER — ONDANSETRON 4 MG PO TBDP
4.0000 mg | ORAL_TABLET | Freq: Three times a day (TID) | ORAL | 2 refills | Status: DC | PRN
Start: 2023-09-05 — End: 2024-03-03

## 2023-09-05 MED ORDER — CABOTEGRAVIR & RILPIVIRINE ER 600 & 900 MG/3ML IM SUER
1.0000 | Freq: Once | INTRAMUSCULAR | Status: AC
Start: 2023-09-05 — End: 2023-09-05
  Administered 2023-09-05: 1 via INTRAMUSCULAR

## 2023-09-05 NOTE — Progress Notes (Signed)
HPI: Laura Jones is a 52 y.o. female who presents to the [facility] pharmacy clinic for [place] administration.  Patient Active Problem List   Diagnosis Date Noted   Dysphagia 07/25/2023   Elevated serum creatinine 05/30/2020   Anemia 05/30/2020   Liver fibrosis 05/27/2019   Closed bimalleolar fracture of left ankle 02/03/2019   VIN III (vulvar intraepithelial neoplasia III) 10/14/2018   OA (osteoarthritis) of hip 06/18/2018   Hypercholesteremia 12/08/2017   History of total right hip replacement 09/18/2017   Hepatitis C virus infection cured after antiviral drug therapy 07/30/2017   Acid reflux 07/30/2017   HIV (human immunodeficiency virus infection) (HCC) 07/29/2017   Cystocele, lateral 07/25/2016   Avascular necrosis of bone of hip, left (HCC) 09/13/2015   Erythropoietic porphyria (HCC) 09/13/2015   Depression 07/01/2015    Patient's Medications  New Prescriptions   No medications on file  Previous Medications   ALBUTEROL (VENTOLIN HFA) 108 (90 BASE) MCG/ACT INHALER    SMARTSIG:1 Puff(s) Via Inhaler 4 Times Daily PRN   AMLODIPINE (NORVASC) 5 MG TABLET    Take 5 mg by mouth daily.   ATORVASTATIN (LIPITOR) 10 MG TABLET    Take 10 mg by mouth at bedtime.   BUPROPION (ZYBAN) 150 MG 12 HR TABLET    Take 150 mg by mouth 2 (two) times daily.   BUSPIRONE (BUSPAR) 15 MG TABLET    Take 15 mg by mouth 2 (two) times daily.   CABOTEGRAVIR & RILPIVIRINE ER (CABENUVA) 600 & 900 MG/3ML INJECTION    Inject 1 kit into the muscle every 2 (two) months.   CHOLECALCIFEROL (VITAMIN D) 50 MCG (2000 UT) TABLET    Take 4,000 Units by mouth daily.    DEXLANSOPRAZOLE (DEXILANT) 60 MG CAPSULE    Take 60 mg by mouth daily.   DICYCLOMINE (BENTYL) 10 MG CAPSULE    Take 10 mg by mouth 2 (two) times daily.    DIPHENHYDRAMINE (BENADRYL ALLERGY) 25 MG CAPSULE    Take 25 mg by mouth every 6 (six) hours as needed.   DIPHENHYDRAMINE-ACETAMINOPHEN (TYLENOL PM) 25-500 MG TABS TABLET    Take 1 tablet by mouth  daily as needed.   FAMOTIDINE (PEPCID) 40 MG TABLET    Take 40 mg by mouth at bedtime.   FLUOXETINE (PROZAC) 40 MG CAPSULE    Take 40 mg by mouth daily.   IBANDRONATE (BONIVA) 150 MG TABLET    Take 150 mg by mouth every 30 (thirty) days.   LOPERAMIDE (IMODIUM A-D) 2 MG TABLET    Take 2 mg by mouth as needed for diarrhea or loose stools.   METOPROLOL TARTRATE (LOPRESSOR) 25 MG TABLET    Take 25 mg by mouth 2 (two) times daily.   OMEGA-3 FATTY ACIDS (FISH OIL) 1200 MG CAPS    Take by mouth. Takes one bid   ONDANSETRON (ZOFRAN) 4 MG TABLET    Take 4 mg by mouth every 8 (eight) hours as needed for nausea or vomiting.   TIZANIDINE (ZANAFLEX) 4 MG TABLET    Take 4 mg by mouth at bedtime. As needed   TRAZODONE (DESYREL) 100 MG TABLET    Take 100-200 mg by mouth at bedtime.  Modified Medications   No medications on file  Discontinued Medications   No medications on file    Allergies: Allergies  Allergen Reactions   Levaquin [Levofloxacin] Rash    Difficulty breathing, mouth swelling/rash   Compazine [Prochlorperazine Edisylate]     Muscle twitching   Droperidol  uncontrolled muscle movements    Imitrex [Sumatriptan]     Difficulty breathing, chest pain; All "triptans"   Promethazine    Reglan [Metoclopramide]     Uncontrolled twitching   Sulfa Antibiotics Hives    Hives    Tagamet (Premixed [Cimetidine]    Thorazine [Chlorpromazine]     Uncontrolled muscle twitching    Labs: Lab Results  Component Value Date   HIV1RNAQUANT Not Detected 05/02/2023   HIV1RNAQUANT <20 (H) 03/12/2023   HIV1RNAQUANT Not Detected 11/08/2022   CD4TABS 470 05/02/2023   CD4TABS 350 (L) 11/08/2022   CD4TABS 758 12/29/2020    RPR and STI Lab Results  Component Value Date   LABRPR NON-REACTIVE 05/10/2020    STI Results GC CT  05/10/2020 11:08 AM Negative  Negative     Hepatitis B Lab Results  Component Value Date   HEPBSAB REACTIVE (A) 03/12/2023   HEPBSAG NON-REACTIVE 07/29/2017    Hepatitis C No results found for: "HEPCAB", "HCVRNAPCRQN" Hepatitis A Lab Results  Component Value Date   HAV REACTIVE (A) 07/29/2017   Lipids: Lab Results  Component Value Date   CHOL 185 05/02/2023   TRIG 336 (H) 05/02/2023   HDL 45 (L) 05/02/2023   CHOLHDL 4.1 05/02/2023   VLDL 65 (H) 07/29/2017   LDLCALC 95 05/02/2023    TARGET DATE: The 14th  Assessment: Laura Jones presents today for her maintenance Cabenuva injections. Past injections were tolerated well without issues. Last HIV RNA was undetectable in May. Per provider, ok to do labs every 6 months. Agrees to receive the annual flu and 2024-2025 COVID vaccine today. Also asking for a Zofran refill.  Administered cabotegravir 600mg /61mL in left upper outer quadrant of the gluteal muscle. Administered rilpivirine 900 mg/40mL in the right upper outer quadrant of the gluteal muscle. No issues with injections. She will follow up in 2 months for next set of injections.  Plan: - Cabenuva injections administered - Administered the annual flu vaccine today - Administered the 2024-2025 COVID vaccine today - Refill Zofran - Next injections scheduled for 11/05/23 with Laura Jones and 01/01/24 with me - Call with any issues or questions  Laura Jones L. Nicolette Gieske, PharmD, BCIDP, AAHIVP, CPP Clinical Pharmacist Practitioner Infectious Diseases Clinical Pharmacist [facility]

## 2023-09-22 DIAGNOSIS — Z881 Allergy status to other antibiotic agents status: Secondary | ICD-10-CM | POA: Diagnosis not present

## 2023-09-22 DIAGNOSIS — R112 Nausea with vomiting, unspecified: Secondary | ICD-10-CM | POA: Diagnosis not present

## 2023-09-22 DIAGNOSIS — Z1152 Encounter for screening for COVID-19: Secondary | ICD-10-CM | POA: Diagnosis not present

## 2023-09-22 DIAGNOSIS — R1032 Left lower quadrant pain: Secondary | ICD-10-CM | POA: Diagnosis not present

## 2023-09-22 DIAGNOSIS — K529 Noninfective gastroenteritis and colitis, unspecified: Secondary | ICD-10-CM | POA: Diagnosis not present

## 2023-09-22 DIAGNOSIS — R109 Unspecified abdominal pain: Secondary | ICD-10-CM | POA: Diagnosis not present

## 2023-09-22 DIAGNOSIS — Z882 Allergy status to sulfonamides status: Secondary | ICD-10-CM | POA: Diagnosis not present

## 2023-09-22 DIAGNOSIS — Z7902 Long term (current) use of antithrombotics/antiplatelets: Secondary | ICD-10-CM | POA: Diagnosis not present

## 2023-09-22 DIAGNOSIS — Z79899 Other long term (current) drug therapy: Secondary | ICD-10-CM | POA: Diagnosis not present

## 2023-09-22 DIAGNOSIS — R1013 Epigastric pain: Secondary | ICD-10-CM | POA: Diagnosis not present

## 2023-09-24 ENCOUNTER — Ambulatory Visit (HOSPITAL_COMMUNITY): Payer: Medicare Other

## 2023-09-30 ENCOUNTER — Encounter (INDEPENDENT_AMBULATORY_CARE_PROVIDER_SITE_OTHER): Payer: Self-pay | Admitting: Gastroenterology

## 2023-09-30 ENCOUNTER — Ambulatory Visit (INDEPENDENT_AMBULATORY_CARE_PROVIDER_SITE_OTHER): Payer: Medicare Other | Admitting: Gastroenterology

## 2023-09-30 VITALS — BP 113/73 | HR 73 | Temp 98.5°F | Ht 61.0 in | Wt 176.5 lb

## 2023-09-30 DIAGNOSIS — K219 Gastro-esophageal reflux disease without esophagitis: Secondary | ICD-10-CM

## 2023-09-30 DIAGNOSIS — R1013 Epigastric pain: Secondary | ICD-10-CM

## 2023-09-30 DIAGNOSIS — R1319 Other dysphagia: Secondary | ICD-10-CM

## 2023-09-30 MED ORDER — OMEPRAZOLE 40 MG PO CPDR: 40.0000 mg | DELAYED_RELEASE_CAPSULE | Freq: Two times a day (BID) | ORAL | 1 refills | Status: DC

## 2023-09-30 MED ORDER — SUCRALFATE 1 GM/10ML PO SUSP: 1.0000 g | Freq: Three times a day (TID) | ORAL | 1 refills | Status: DC

## 2023-09-30 NOTE — Patient Instructions (Signed)
Please stop dexilant and famotidine I am starting omeprazole 40mg  twice daily and carafate 1g to be taken up to 4 times per day for upper abdominal discomfort Please keep US of the liver scheduled for 10/9 Let me know if symptoms worsen.  Follow up 3 months  It was a pleasure to see you today. I want to create trusting relationships with patients and provide genuine, compassionate, and quality care. I truly value your feedback! please be on the lookout for a survey regarding your visit with me today. I appreciate your input about our visit and your time in completing this!    Hugh Kamara L. Jeanmarie Hubert, MSN, APRN, AGNP-C Adult-Gerontology Nurse Practitioner [facility] Gastroenterology at [facility]

## 2023-09-30 NOTE — Progress Notes (Signed)
Referring Provider: Toma Deiters, MD Primary Care Physician:  Toma Deiters, MD Primary GI Physician: Dr. Levon Hedger   Chief Complaint  Patient presents with   Altered Taste    Pt had EGD on 08/14/23 and ever since has had altered taste buds. About 2 weeks about pt ate and began to vomit for about 3 days. Abdominal pain under rib cage-feels like she is being squeezed. Pt has been able to keep down food since getting sick. Pt states after eating she will belch and it will be like acid.    HPI:   Laura Jones is a 52 y.o. female with past medical history of HIV, HCV status post treatment failure with combination of pegasys/RBV status post open treatment with SVR, advanced liver fibrosis secondary to HCV  Patient presenting today for follow up of dysphagia/GERD and epigastric pain   History: had liver biopsy performed in 2003 that showed early cirrhosis per patient report.   elastography showed F2/F3 fibrosis.  She has been following with the infectious disease  for her advanced liver fibrosis. currently following with the HIV clinic in [facility].  Reportedly had treatment in the past for hep C and achieved SVR in 2018.  Currently on treatment with Cabenuva.  Last viral load in May was undetectable   AFP in may was 3.9  CMP 9/29 with normal LFTs and CBC with plt count 237k  Last seen August 2024, at that time having choking with eating x6 months. Having heartburn despite dexilant 60mg  daily. Also taking famotidine at bedtime. Also having some nausea.   Recommended to schedule EGD, liver elastography, request colonoscopy record  Elastography scheduled for tomorrow  EGD as below, no findings to explain dysphagia, will need manometry and esophagram if persistent symptoms.   Present: No dysphagia since EGD. She notes she had some nausea and vomiting right after her EGD which cleared up. She notes that she had a recurrence of this at the end of September, prompting her to go to the  ED at [facility]. CT A/P with contrast was without acute findings. Told she may have gastroenteritis. She is having some intermittent epigastric pain. She notes she is belching some. She has had some instances of a sour taste in her mouth. She feels she is regurgitating food at times as well but denies heartburn. She denies worsening of symptoms at night. She was given zofran in the ED but notes nausea has improved and she has not needed this. .   She is on dexilant 60mg  daily and famotidine 40mg  at bedtime. She has been maintained on dexilant since around 2018. She tries to avoid spicy, greasy foods. She does note more stress lately with her mother being ill.   She notes that she does wake up with a sore throat and some coughing at times as well.  She denies NSAID use, no ETOH, No rectal bleeding or melena  Last Colonoscopy:2021 normal   Last Endoscopy:07/2023- No endoscopic esophageal abnormality to explain                            patient's dysphagia. Esophagus dilated. Biopsied                           - 1 cm hiatal hernia.                           -  Gastroesophageal flap valve classified as Hill                            Grade III (minimal fold, loose to endoscope, hiatal                            hernia likely).                           - Three gastric polyps-unremarkable squamous mucosa                            - Normal examined duodenum.  Recommendations:  Repeat colonoscopy 10 years   Past Medical History:  Diagnosis Date   Anemia    hx of    Anxiety    Arthritis    left knee   Avascular necrosis of bone of hip, left (HCC)    Bulging lumbar disc    Depression    Dyspnea    with excertion   GERD (gastroesophageal reflux disease)    Hepatitis    C treated 2 years ago went undetected   History of kidney stones    HIV (human immunodeficiency virus infection) (HCC) 07/29/2017   HIV infection (HCC)    Insomnia    Migraines    Nerve pain    Perimenopausal    Porphyria  (HCC)    PTSD (post-traumatic stress disorder)    Spinal stenosis of lumbosacral region    Tendonitis    right arm    Trauma     Past Surgical History:  Procedure Laterality Date   ABDOMINAL HYSTERECTOMY     ANKLE SURGERY Left 01/26/2019   BIOPSY  08/14/2023   Procedure: BIOPSY;  Surgeon: Dolores Frame, MD;  [facility];  Service: Gastroenterology;;   CESAREAN SECTION     ESOPHAGOGASTRODUODENOSCOPY     x2   ESOPHAGOGASTRODUODENOSCOPY (EGD) WITH PROPOFOL N/A 08/14/2023   Procedure: ESOPHAGOGASTRODUODENOSCOPY (EGD) WITH PROPOFOL;  Surgeon: Dolores Frame, MD;  [facility];  Service: Gastroenterology;  Laterality: N/A;  7:30am;asa 1   INCONTINENCE SURGERY     JOINT REPLACEMENT     left hip replacement Dr. Lequita Halt 06-18-18   SAVORY DILATION  08/14/2023   Procedure: SAVORY DILATION;  Surgeon: Dolores Frame, MD;  [facility]: [facility];  Service: Gastroenterology;;   TOTAL HIP ARTHROPLASTY Right 02/2013   TOTAL HIP ARTHROPLASTY Left 06/18/2018   Procedure: LEFT TOTAL HIP ARTHROPLASTY ANTERIOR APPROACH;  Surgeon: Ollen Gross, MD;  Location: [facility];  Service: Orthopedics;  Laterality: Left;   TUBAL LIGATION     URETHRAL SLING     VULVECTOMY PARTIAL N/A 08/19/2019   Procedure: VULVECTOMY PARTIAL;  Surgeon: Lazaro Arms, MD;  Location: [facility];  Service: Gynecology;  Laterality: N/A;    Current Outpatient Medications  Medication Sig Dispense Refill   albuterol (VENTOLIN HFA) 108 (90 Base) MCG/ACT inhaler SMARTSIG:1 Puff(s) Via Inhaler 4 Times Daily PRN     amLODipine (NORVASC) 5 MG tablet Take 5 mg by mouth daily.     atorvastatin (LIPITOR) 10 MG tablet Take 10 mg by mouth at bedtime.  0   buPROPion (ZYBAN) 150 MG 12 hr tablet Take 150 mg by mouth 2 (two) times daily.     busPIRone (BUSPAR) 15 MG tablet Take 15 mg by  mouth 2 (two) times daily.  0   cabotegravir & rilpivirine ER (CABENUVA) 600 & 900 MG/3ML injection Inject 1 kit  into the muscle every 2 (two) months. 6 mL 5   Cholecalciferol (VITAMIN D) 50 MCG (2000 UT) tablet Take 4,000 Units by mouth daily.      dexlansoprazole (DEXILANT) 60 MG capsule Take 60 mg by mouth daily.     dicyclomine (BENTYL) 10 MG capsule Take 10 mg by mouth 2 (two) times daily.      diphenhydrAMINE (BENADRYL ALLERGY) 25 mg capsule Take 25 mg by mouth every 6 (six) hours as needed.     diphenhydramine-acetaminophen (TYLENOL PM) 25-500 MG TABS tablet Take 1 tablet by mouth daily as needed.     famotidine (PEPCID) 40 MG tablet Take 40 mg by mouth at bedtime.     FLUoxetine (PROZAC) 40 MG capsule Take 40 mg by mouth daily.     ibandronate (BONIVA) 150 MG tablet Take 150 mg by mouth every 30 (thirty) days.     loperamide (IMODIUM A-D) 2 MG tablet Take 2 mg by mouth as needed for diarrhea or loose stools.     metoprolol tartrate (LOPRESSOR) 25 MG tablet Take 25 mg by mouth 2 (two) times daily.     Omega-3 Fatty Acids (FISH OIL) 1200 MG CAPS Take by mouth. Takes one bid     ondansetron (ZOFRAN-ODT) 4 MG disintegrating tablet Take 1 tablet (4 mg total) by mouth every 8 (eight) hours as needed for nausea or vomiting. 30 tablet 2   tiZANidine (ZANAFLEX) 4 MG tablet Take 4 mg by mouth at bedtime. As needed     traZODone (DESYREL) 100 MG tablet Take 100-200 mg by mouth at bedtime.     No current facility-administered medications for this visit.    Allergies as of 09/30/2023 - Review Complete 09/30/2023  Allergen Reaction Noted   Levaquin [levofloxacin] Rash 07/29/2017   Compazine [prochlorperazine edisylate]  07/29/2017   Droperidol  07/29/2017   Imitrex [sumatriptan]  07/29/2017   Promethazine  09/01/2022   Reglan [metoclopramide]  07/29/2017   Sulfa antibiotics Hives 07/29/2017   Tagamet (premixed [cimetidine]  08/15/2021   Thorazine [chlorpromazine]  07/29/2017    Family History  Problem Relation Age of Onset   COPD Mother    Osteoporosis Mother    Hypertension Father    Healthy  Sister    Heart disease Maternal Grandmother    Heart disease Maternal Grandfather     Social History   Socioeconomic History   Marital status: Divorced    Spouse name: Not on file   Number of children: Not on file   Years of education: Not on file   Highest education level: Not on file  Occupational History   Not on file  Tobacco Use   Smoking status: Never    Passive exposure: Past   Smokeless tobacco: Never  Vaping Use   Vaping status: Never Used  Substance and Sexual Activity   Alcohol use: No   Drug use: No   Sexual activity: Not Currently    Birth control/protection: Surgical    Comment: declined condoms (hyst)  Other Topics Concern   Not on file  Social History Narrative   Not on file   Social Determinants of Health   Financial Resource Strain: Not on file  Food Insecurity: Not on file  Transportation Needs: Not on file  Physical Activity: Not on file  Stress: Not on file  Social Connections: Unknown (04/23/2022)   Received  from [facility]   Social Network    Social Network: Not on file   Review of systems General: negative for malaise, night sweats, fever, chills, weight loss Neck: Negative for lumps, goiter, pain and significant neck swelling Resp: Negative for cough, wheezing, dyspnea at rest CV: Negative for chest pain, leg swelling, palpitations, orthopnea GI: denies melena, hematochezia, nausea, vomiting, diarrhea, constipation, dysphagia, odyonophagia, early satiety or unintentional weight loss. +sour taste in mouth +belching +epigastric pain  MSK: Negative for joint pain or swelling, back pain, and muscle pain. Derm: Negative for itching or rash Psych: Denies depression, anxiety, memory loss, confusion. No homicidal or suicidal ideation.  Heme: Negative for prolonged bleeding, bruising easily, and swollen nodes. Endocrine: Negative for cold or heat intolerance, polyuria, polydipsia and goiter. Neuro: negative for tremor, gait  imbalance, syncope and seizures. The remainder of the review of systems is noncontributory.  Physical Exam: BP 113/73   Pulse 73   Temp 98.5 F (36.9 C)   Ht 5\' 1"  (1.549 m)   Wt 176 lb 8 oz (80.1 kg)   LMP 12/25/2003   BMI 33.35 kg/m  General:   Alert and oriented. No distress noted. Pleasant and cooperative.  Head:  Normocephalic and atraumatic. Eyes:  Conjuctiva clear without scleral icterus. Mouth:  Oral mucosa pink and moist. Good dentition. No lesions. Heart: Normal rate and rhythm, s1 and s2 heart sounds present.  Lungs: Clear lung sounds in all lobes. Respirations equal and unlabored. Abdomen:  +BS, soft, non-tender and non-distended. No rebound or guarding. No HSM or masses noted. Derm: No palmar erythema or jaundice Msk:  Symmetrical without gross deformities. Normal posture. Extremities:  Without edema. Neurologic:  Alert and  oriented x4 Psych:  Alert and cooperative. Normal mood and affect.  Invalid input(s): "6 MONTHS"   ASSESSMENT: Laura Jones is a 52 y.o. female presenting today for follow up of GERD/dysphagia and epigastric pain.  GERD/Epigastric pain: dysphagia has resolved after EGD. She had some nausea and vomiting the day after EGD which resolved. Had recurrence at the end of last month, along with some epigastric pain, seen in the ED at [facility] with normal lipase, CMP, CBC, unremarkable CT A/P. Diagnosed with gastroenteritis and given zofran. Nausea and vomiting have resolved. Still having some intermittent epigastric pain, not precipitated by anything.  She does note more sour taste in her mouth recently as well as belching, regurgitation of foods. She has been maintained on dexilant for about 6 years noting never really having resolution of her GERD symptoms.  Low suspicion for PUD given recent EGD and no ETOH or NSAIDs. She does note more stress recently, I suspect symptoms are mostly due to her GERD being uncontrolled, and could be some mild gastritis  especially with recent increase in stres. Will stop dexilant and famotidine and start omeprazole 40mg  BID. Will also send carafate 1g QID to help with epigastric pain. Instructed on good reflux precautions.    PLAN:  Stop dexilant and famotidine  2. Start omeprazole 40mg  BID  3. Carafate QID  4. Keep [place] scheduled on 10/9   All questions were answered, patient verbalized understanding and is in agreement with plan as outlined above.   Follow Up: 3 months   Laura Jones L. Jeanmarie Hubert, MSN, APRN, AGNP-C Adult-Gerontology Nurse Practitioner [facility] for GI Diseases  I have reviewed the note and agree with the APP's assessment as described in this progress note  Katrinka Blazing, MD Gastroenterology and Hepatology [facility] Gastroenterology

## 2023-10-01 DIAGNOSIS — K219 Gastro-esophageal reflux disease without esophagitis: Secondary | ICD-10-CM | POA: Diagnosis not present

## 2023-10-01 DIAGNOSIS — K529 Noninfective gastroenteritis and colitis, unspecified: Secondary | ICD-10-CM | POA: Diagnosis not present

## 2023-10-02 ENCOUNTER — Ambulatory Visit (HOSPITAL_COMMUNITY)
Admission: RE | Admit: 2023-10-02 | Discharge: 2023-10-02 | Disposition: A | Discharge status: Home / Self Care | Payer: Medicare Other | Source: Ambulatory Visit | Attending: Gastroenterology | Admitting: Gastroenterology

## 2023-10-02 DIAGNOSIS — K7400 Hepatic fibrosis, unspecified: Secondary | ICD-10-CM | POA: Diagnosis not present

## 2023-10-02 DIAGNOSIS — R932 Abnormal findings on diagnostic imaging of liver and biliary tract: Secondary | ICD-10-CM | POA: Diagnosis not present

## 2023-10-02 DIAGNOSIS — Z944 Liver transplant status: Secondary | ICD-10-CM | POA: Diagnosis not present

## 2023-10-22 ENCOUNTER — Other Ambulatory Visit (HOSPITAL_COMMUNITY): Payer: Self-pay

## 2023-10-22 ENCOUNTER — Other Ambulatory Visit: Payer: Self-pay

## 2023-10-22 NOTE — Progress Notes (Signed)
Specialty Pharmacy Refill Coordination Note  Laura Jones is a 52 y.o. female assessed today regarding refills of clinic administered specialty medication(s) Cabotegravir & Rilpivirine   Clinic requested Courier to Provider Office   Delivery date: 10/31/23   Verified address: [facility] [address] [place] [address]   Medication will be filled on 10/30/23.

## 2023-10-23 ENCOUNTER — Other Ambulatory Visit (INDEPENDENT_AMBULATORY_CARE_PROVIDER_SITE_OTHER): Payer: Self-pay | Admitting: Gastroenterology

## 2023-10-29 ENCOUNTER — Ambulatory Visit (INDEPENDENT_AMBULATORY_CARE_PROVIDER_SITE_OTHER): Payer: Medicare Other | Admitting: Gastroenterology

## 2023-10-30 ENCOUNTER — Other Ambulatory Visit: Payer: Self-pay

## 2023-10-30 ENCOUNTER — Other Ambulatory Visit (HOSPITAL_COMMUNITY): Payer: Self-pay

## 2023-10-31 ENCOUNTER — Telehealth: Payer: Self-pay

## 2023-10-31 NOTE — Telephone Encounter (Signed)
[facility] Patient Advocate Encounter  Patient's medications CABENUVA have been couriered to [facility] from [facility] and will be administered at the patients appointment on 11/05/23.  Kae Heller, CPhT Specialty Pharmacy Patient [facility] for Infectious Disease Phone: [phone] Fax:  [phone]

## 2023-11-05 ENCOUNTER — Encounter: Payer: Self-pay | Admitting: Infectious Diseases

## 2023-11-05 ENCOUNTER — Ambulatory Visit (INDEPENDENT_AMBULATORY_CARE_PROVIDER_SITE_OTHER): Payer: Medicare Other | Admitting: Infectious Diseases

## 2023-11-05 ENCOUNTER — Other Ambulatory Visit: Payer: Self-pay

## 2023-11-05 VITALS — BP 106/70 | HR 63 | Temp 98.0°F | Wt 176.0 lb

## 2023-11-05 DIAGNOSIS — Z8619 Personal history of other infectious and parasitic diseases: Secondary | ICD-10-CM

## 2023-11-05 DIAGNOSIS — Z79899 Other long term (current) drug therapy: Secondary | ICD-10-CM

## 2023-11-05 DIAGNOSIS — Z21 Asymptomatic human immunodeficiency virus [HIV] infection status: Secondary | ICD-10-CM

## 2023-11-05 MED ORDER — CABOTEGRAVIR & RILPIVIRINE ER 600 & 900 MG/3ML IM SUER
1.0000 | Freq: Once | INTRAMUSCULAR | Status: AC
  Administered 2023-11-05: 1 via INTRAMUSCULAR

## 2023-11-05 NOTE — Progress Notes (Signed)
Patient Name: Laura Jones  Date of Birth: [birth year] MRN: [id number]  PCP: Toma Deiters, MD    SUBJECTIVE:  Brief Narrative:   Laura Jones is a 52 y.o. female with HIV disease, dx "a long time ago." Relocated from [place] in 2018 where Laura Jones was in care with [facility].  History of Hep C, cured (s/p treatment x 2, HCV Quant < 15, 07/29/2017).   +Cirrhosis per notes from previous liver biopsies  S/P hysterectomy Hep B sAg (-), Hep B sAb (-) following several attempts to vaccinate.  History of OIs: none known.  HIV Risk: heterosexual contact  Previous Regimens:  Prezista + Norvir + Truvada  Genvoya >> suppressed  Biktarvy 09/2018 >> switched d/t DDI with medications Cabenuva injections, 11/2021   Genotype:  K103N - NNRTI resistance per chart records    Subjective  Discussed the use of AI scribe software for clinical note transcription with the patient, who gave verbal consent to proceed.  History of Present Illness   Laura Jones is here today with a history of HIV and liver disease (s/p HCV cure, severe fibrosis/cirrhosis) presents for a routine follow-up. Laura Jones reports occasional irritation and itching at the right injection site for the [facility] which is managed with topical Benadryl. The patient denies any worsening of these symptoms.  Laura Jones also reports a recent episode of gastroenteritis, which Laura Jones attributes to consumption of pork chops. The patient has since avoided pork and reports no recurrence of symptoms. Laura Jones has been evaluated by a gastroenterologist, who performed an endoscopy and dilation of the throat.  The patient also mentions occasional bloating, which Laura Jones attributes to gas and which resolves spontaneously. Laura Jones denies any fluid retention or swelling in the legs or midsection.  The patient is on cholesterol medication due to a family history of heart disease. Laura Jones also reports a change in her reflux medication, which has improved her symptoms of acid  reflux.  The patient has received her flu shot and completed a two-dose shingles vaccine series.   The patient's liver disease is being monitored, with recent imaging showing no changes in echogenicity and no liver tumor. Compensated liver disease.  Laura Jones is due for another liver check around March 2025 and is now being followed by GI for this.   The patient denies any new health issues since her last visit. Laura Jones is not diabetic, does not smoke, and does not consume alcohol.      Review of Systems  Constitutional:  Negative for chills and fever.  HENT:  Negative for tinnitus.   Eyes:  Negative for photophobia.  Respiratory:  Negative for cough.   Cardiovascular:  Negative for chest pain.  Gastrointestinal:  Negative for diarrhea, nausea and vomiting.  Genitourinary:  Negative for dysuria.  Skin:  Negative for rash.       Itching as per HPI  Neurological:  Negative for headaches.    Past Medical History:  Diagnosis Date   Anemia    hx of    Anxiety    Arthritis    left knee   Avascular necrosis of bone of hip, left (HCC)    Bulging lumbar disc    Depression    Dyspnea    with excertion   GERD (gastroesophageal reflux disease)    Hepatitis    C treated 2 years ago went undetected   History of kidney stones    HIV (human immunodeficiency virus infection) (HCC) 07/29/2017   HIV infection (HCC)    Insomnia  Migraines    Nerve pain    Perimenopausal    Porphyria (HCC)    PTSD (post-traumatic stress disorder)    Spinal stenosis of lumbosacral region    Tendonitis    right arm    Trauma     Social History   Tobacco Use   Smoking status: Never    Passive exposure: Past   Smokeless tobacco: Never  Vaping Use   Vaping status: Never Used  Substance Use Topics   Alcohol use: No   Drug use: No     Allergies  Allergen Reactions   Levaquin [Levofloxacin] Rash    Difficulty breathing, mouth swelling/rash   Compazine [Prochlorperazine Edisylate]     Muscle twitching    Droperidol     uncontrolled muscle movements    Imitrex [Sumatriptan]     Difficulty breathing, chest pain; All "triptans"   Promethazine    Reglan [Metoclopramide]     Uncontrolled twitching   Sulfa Antibiotics Hives    Hives    Tagamet (Premixed [Cimetidine]    Thorazine [Chlorpromazine]     Uncontrolled muscle twitching    Objective:  Vitals:   11/05/23 1036  BP: 106/70  Pulse: 63  Temp: 98 F (36.7 C)  TempSrc: Oral  SpO2: 98%  Weight: 176 lb (79.8 kg)   Body mass index is 33.25 kg/m.   Physical Exam Constitutional:      Appearance: Normal appearance. Laura Jones is not ill-appearing.  HENT:     Mouth/Throat:     Mouth: Mucous membranes are moist.     Pharynx: Oropharynx is clear.  Eyes:     General: No scleral icterus. Cardiovascular:     Rate and Rhythm: Normal rate and regular rhythm.  Pulmonary:     Effort: Pulmonary effort is normal.  Neurological:     Mental Status: Laura Jones is oriented to person, place, and time.  Psychiatric:        Mood and Affect: Mood normal.        Thought Content: Thought content normal.     Lab Results HIV 1 RNA Quant (Copies/mL)  Date Value  05/02/2023 Not Detected  03/12/2023 <20 (H)  11/08/2022 Not Detected   CD4 T Cell Abs (/uL)  Date Value  05/02/2023 470  11/08/2022 350 (L)  12/29/2020 758   Lab Results  Component Value Date   ALT 9 05/02/2023   AST 14 05/02/2023   ALKPHOS 95 12/19/2019   BILITOT 0.3 05/02/2023   Lab Results  Component Value Date   CREATININE 1.17 (H) 05/02/2023   CREATININE 1.06 (H) 01/08/2022   CREATININE 1.14 (H) 05/10/2020     ASSESSMENT & PLAN:     HIV Stable on Cabenuva injections with minor skin irritation at injection site. No worsening of symptoms. -Continue Cabenuva injections. -Use Benadryl cream for skin irritation as needed. -Pertinent labs ordered today (see below) - q46m checks  GERD Recent episode of gastroenteritis possibly related to food intake. Endoscopy performed  with dilation of throat. No significant findings. -Continue current management with GI team  -No interactions with PPIs/H2blockers or carafate with current parenteral ARV regimen.  Liver Disease Severe Fibrosis / Cirrhosis s/p HCV Cure Elastography done recently with compensated liver disease, no mass/tumor. AFP in May of this year was < 5. No synthetic or clinical findings of decompensation. Regular monitoring by gastroenterologist. -Continue current management and monitoring.  Hyperlipidemia On cholesterol medication for heart protection due to family history of heart disease. -Continue cholesterol medication for heart protection  r/t HIV   General Health Maintenance -Flu and COVID vaccines up to date. -Completed two-dose shingles vaccine. -Add diabetes screening to lab work today.      Orders Placed This Encounter  Procedures   T-helper cells (CD4) count   HIV 1 RNA quant-no reflex-bld   COMPLETE METABOLIC PANEL WITH GFR   Hemoglobin A1c   Meds ordered this encounter  Medications   cabotegravir & rilpivirine ER (CABENUVA) 600 & 900 MG/3ML injection 1 kit    Rexene Alberts, MSN, NP-C [facility] for Infectious Disease [facility]  [email or web address] Pager: [phone] Office: [phone] [facility] Main Line: [phone]

## 2023-11-05 NOTE — Patient Instructions (Addendum)
01/01/2024 is your next appointment with our pharmacy team.   Your liver ultrasound is next due around march 2025 - looks like your GI team will be taking over these screenings twice a year.  No changes seen on the last study which is the goal.   Happy Holidays! I will plan to see you back again in 6 months

## 2023-11-06 LAB — T-HELPER CELLS (CD4) COUNT (NOT AT ARMC)
CD4 % Helper T Cell: 32 % — ABNORMAL LOW (ref 33–65)
CD4 T Cell Abs: 578 /uL (ref 400–1790)

## 2023-11-07 LAB — COMPLETE METABOLIC PANEL WITH GFR
AG Ratio: 1.4 (calc) (ref 1.0–2.5)
ALT: 9 U/L (ref 6–29)
AST: 15 U/L (ref 10–35)
Albumin: 4.1 g/dL (ref 3.6–5.1)
Alkaline phosphatase (APISO): 45 U/L (ref 37–153)
BUN: 8 mg/dL (ref 7–25)
CO2: 28 mmol/L (ref 20–32)
Calcium: 9.1 mg/dL (ref 8.6–10.4)
Chloride: 103 mmol/L (ref 98–110)
Creat: 1 mg/dL (ref 0.50–1.03)
Globulin: 2.9 g/dL (ref 1.9–3.7)
Glucose, Bld: 102 mg/dL — ABNORMAL HIGH (ref 65–99)
Potassium: 3.6 mmol/L (ref 3.5–5.3)
Sodium: 140 mmol/L (ref 135–146)
Total Bilirubin: 0.2 mg/dL (ref 0.2–1.2)
Total Protein: 7 g/dL (ref 6.1–8.1)
eGFR: 67 mL/min/{1.73_m2} (ref >=60)

## 2023-11-07 LAB — HIV-1 RNA QUANT-NO REFLEX-BLD
HIV 1 RNA Quant: NOT DETECTED {copies}/mL
HIV-1 RNA Quant, Log: NOT DETECTED {Log_copies}/mL

## 2023-11-07 LAB — HEMOGLOBIN A1C
Hgb A1c MFr Bld: 5.8 %{Hb} — ABNORMAL HIGH (ref <5.7)
Mean Plasma Glucose: 120 mg/dL
eAG (mmol/L): 6.6 mmol/L

## 2023-11-25 DIAGNOSIS — R1310 Dysphagia, unspecified: Secondary | ICD-10-CM | POA: Diagnosis not present

## 2023-11-25 DIAGNOSIS — K219 Gastro-esophageal reflux disease without esophagitis: Secondary | ICD-10-CM | POA: Diagnosis not present

## 2023-11-25 DIAGNOSIS — N182 Chronic kidney disease, stage 2 (mild): Secondary | ICD-10-CM | POA: Diagnosis not present

## 2023-11-25 DIAGNOSIS — K529 Noninfective gastroenteritis and colitis, unspecified: Secondary | ICD-10-CM | POA: Diagnosis not present

## 2023-11-25 DIAGNOSIS — I10 Essential (primary) hypertension: Secondary | ICD-10-CM | POA: Diagnosis not present

## 2023-11-25 DIAGNOSIS — E7849 Other hyperlipidemia: Secondary | ICD-10-CM | POA: Diagnosis not present

## 2023-12-10 ENCOUNTER — Other Ambulatory Visit (HOSPITAL_COMMUNITY): Payer: Self-pay

## 2023-12-10 ENCOUNTER — Other Ambulatory Visit (INDEPENDENT_AMBULATORY_CARE_PROVIDER_SITE_OTHER): Payer: Self-pay | Admitting: Gastroenterology

## 2023-12-11 ENCOUNTER — Other Ambulatory Visit: Payer: Self-pay | Admitting: Pharmacist

## 2023-12-11 ENCOUNTER — Other Ambulatory Visit: Payer: Self-pay

## 2023-12-11 ENCOUNTER — Other Ambulatory Visit (HOSPITAL_COMMUNITY): Payer: Self-pay

## 2023-12-11 DIAGNOSIS — Z21 Asymptomatic human immunodeficiency virus [HIV] infection status: Secondary | ICD-10-CM

## 2023-12-11 MED ORDER — CABOTEGRAVIR & RILPIVIRINE ER 600 & 900 MG/3ML IM SUER
1.0000 | INTRAMUSCULAR | 5 refills | Status: DC
  Filled 2023-12-11: qty 6, 60d supply, fill #0
  Filled 2024-02-12: qty 6, 60d supply, fill #1
  Filled 2024-04-14: qty 6, 60d supply, fill #2
  Filled 2024-06-15: qty 6, 60d supply, fill #3
  Filled 2024-08-20: qty 6, 60d supply, fill #4
  Filled 2024-10-21: qty 6, 60d supply, fill #5

## 2023-12-11 NOTE — Progress Notes (Signed)
Specialty Pharmacy Refill Coordination Note  Laura Jones is a 52 y.o. female assessed today regarding refills of clinic administered specialty medication(s) Cabotegravir & Rilpivirine (CABENUVA)   Clinic requested Courier to Provider Office   Delivery date: 12/24/23   Verified address: [address] [place] [address]   Medication will be filled on 12/23/23.

## 2023-12-11 NOTE — Telephone Encounter (Signed)
Seen 09/30/23

## 2023-12-23 ENCOUNTER — Other Ambulatory Visit: Payer: Self-pay

## 2023-12-24 ENCOUNTER — Telehealth: Payer: Self-pay

## 2023-12-24 NOTE — Telephone Encounter (Signed)
 [facility] Patient Advocate Encounter  Patient's medications CABENUVA  have been couriered to [facility] from [facility] and will be administered at the patients appointment on 01/01/24.  Charmaine Sharps, CPhT Specialty Pharmacy Patient [facility] for Infectious Disease Phone: [phone] Fax:  [phone]

## 2023-12-31 ENCOUNTER — Ambulatory Visit (INDEPENDENT_AMBULATORY_CARE_PROVIDER_SITE_OTHER): Payer: Medicare Other | Admitting: Gastroenterology

## 2023-12-31 NOTE — Progress Notes (Signed)
 HPI: Laura Jones is a 53 y.o. female who presents to the [facility] pharmacy clinic for Cabenuva  administration.  Patient Active Problem List   Diagnosis Date Noted   Dysphagia 07/25/2023   Elevated serum creatinine 05/30/2020   Anemia 05/30/2020   Liver fibrosis 05/27/2019   Closed bimalleolar fracture of left ankle 02/03/2019   VIN III (vulvar intraepithelial neoplasia III) 10/14/2018   OA (osteoarthritis) of hip 06/18/2018   Hypercholesteremia 12/08/2017   History of total right hip replacement 09/18/2017   Hepatitis C virus infection cured after antiviral drug therapy 07/30/2017   Acid reflux 07/30/2017   HIV (human immunodeficiency virus infection) (HCC) 07/29/2017   Cystocele, lateral 07/25/2016   Avascular necrosis of bone of hip, left (HCC) 09/13/2015   Erythropoietic porphyria (HCC) 09/13/2015   Depression 07/01/2015    Patient's Medications  New Prescriptions   No medications on file  Previous Medications   ALBUTEROL  (VENTOLIN  HFA) 108 (90 BASE) MCG/ACT INHALER    SMARTSIG:1 Puff(s) Via Inhaler 4 Times Daily PRN   AMLODIPINE  (NORVASC ) 5 MG TABLET    Take 5 mg by mouth daily.   ATORVASTATIN  (LIPITOR) 10 MG TABLET    Take 10 mg by mouth at bedtime.   BUPROPION  (ZYBAN ) 150 MG 12 HR TABLET    Take 150 mg by mouth 2 (two) times daily.   BUSPIRONE  (BUSPAR ) 15 MG TABLET    Take 15 mg by mouth 2 (two) times daily.   CABOTEGRAVIR  & RILPIVIRINE  ER (CABENUVA ) 600 & 900 MG/3ML INJECTION    Inject 1 kit into the muscle every 2 (two) months.   CHOLECALCIFEROL (VITAMIN D) 50 MCG (2000 UT) TABLET    Take 4,000 Units by mouth daily.    DICYCLOMINE  (BENTYL ) 10 MG CAPSULE    Take 10 mg by mouth 2 (two) times daily.    DIPHENHYDRAMINE  (BENADRYL  ALLERGY) 25 MG CAPSULE    Take 25 mg by mouth every 6 (six) hours as needed.   DIPHENHYDRAMINE -ACETAMINOPHEN  (TYLENOL  PM) 25-500 MG TABS TABLET    Take 1 tablet by mouth daily as needed.   FLUOXETINE (PROZAC) 40 MG CAPSULE    Take 40 mg by mouth  daily.   IBANDRONATE  (BONIVA ) 150 MG TABLET    Take 150 mg by mouth every 30 (thirty) days.   LOPERAMIDE  (IMODIUM  A-D) 2 MG TABLET    Take 2 mg by mouth as needed for diarrhea or loose stools.   METOPROLOL  TARTRATE (LOPRESSOR ) 25 MG TABLET    Take 25 mg by mouth 2 (two) times daily.   OMEGA-3 FATTY ACIDS (FISH OIL) 1200 MG CAPS    Take by mouth. Takes one bid   OMEPRAZOLE  (PRILOSEC) 40 MG CAPSULE    TAKE 1 CAPSULE BY MOUTH TWICE A DAY   ONDANSETRON  (ZOFRAN -ODT) 4 MG DISINTEGRATING TABLET    Take 1 tablet (4 mg total) by mouth every 8 (eight) hours as needed for nausea or vomiting.   SUCRALFATE  (CARAFATE ) 1 GM/10ML SUSPENSION    TAKE 10 MLS (1 G TOTAL) BY MOUTH 4 (FOUR) TIMES DAILY - WITH MEALS AND AT BEDTIME.   TIZANIDINE  (ZANAFLEX ) 4 MG TABLET    Take 4 mg by mouth at bedtime. As needed   TRAZODONE  (DESYREL ) 100 MG TABLET    Take 100-200 mg by mouth at bedtime.  Modified Medications   No medications on file  Discontinued Medications   No medications on file    Allergies: Allergies  Allergen Reactions   Levaquin [Levofloxacin] Rash    Difficulty  breathing, mouth swelling/rash   Compazine [Prochlorperazine Edisylate]     Muscle twitching   Droperidol     uncontrolled muscle movements    Imitrex [Sumatriptan]     Difficulty breathing, chest pain; All triptans   Promethazine     Reglan [Metoclopramide]     Uncontrolled twitching   Sulfa Antibiotics Hives    Hives    Tagamet (Premixed [Cimetidine]    Thorazine [Chlorpromazine]     Uncontrolled muscle twitching    Labs: Lab Results  Component Value Date   HIV1RNAQUANT Not Detected 11/05/2023   HIV1RNAQUANT Not Detected 05/02/2023   HIV1RNAQUANT <20 (H) 03/12/2023   CD4TABS 578 11/05/2023   CD4TABS 470 05/02/2023   CD4TABS 350 (L) 11/08/2022    RPR and STI Lab Results  Component Value Date   LABRPR NON-REACTIVE 05/10/2020    STI Results GC CT  05/10/2020 11:08 AM Negative  Negative     Hepatitis B Lab Results   Component Value Date   HEPBSAB REACTIVE (A) 03/12/2023   HEPBSAG NON-REACTIVE 07/29/2017   Hepatitis C No results found for: HEPCAB, HCVRNAPCRQN Hepatitis A Lab Results  Component Value Date   HAV REACTIVE (A) 07/29/2017   Lipids: Lab Results  Component Value Date   CHOL 185 05/02/2023   TRIG 336 (H) 05/02/2023   HDL 45 (L) 05/02/2023   CHOLHDL 4.1 05/02/2023   VLDL 65 (H) 07/29/2017   LDLCALC 95 05/02/2023    TARGET DATE: The 14th  Assessment: Laura Jones presents today for her maintenance Cabenuva  injections. Past injections were tolerated well without issues but does continue to have mild itching after injections. Last HIV RNA was undetectable in November; will defer today as she gets q6 month lab checks. She is due for her next follow up with [facility] in May.  Administered cabotegravir  600mg /41mL in left upper outer quadrant of the gluteal muscle. Administered rilpivirine  900 mg/3mL in the right upper outer quadrant of the gluteal muscle. No issues with injections. She will follow up in 2 months for next set of injections.  Plan: - Cabenuva  injections administered - Next injections scheduled for 03/03/24 with me - Call with any issues or questions  Laura Jones, PharmD, BCIDP, AAHIVP, CPP Clinical Pharmacist Practitioner Infectious Diseases Clinical Pharmacist [facility]

## 2024-01-01 ENCOUNTER — Other Ambulatory Visit: Payer: Self-pay

## 2024-01-01 ENCOUNTER — Ambulatory Visit: Payer: Medicare HMO | Admitting: Pharmacist

## 2024-01-01 DIAGNOSIS — B20 Human immunodeficiency virus [HIV] disease: Secondary | ICD-10-CM

## 2024-01-01 DIAGNOSIS — Z21 Asymptomatic human immunodeficiency virus [HIV] infection status: Secondary | ICD-10-CM

## 2024-01-01 MED ORDER — CABOTEGRAVIR & RILPIVIRINE ER 600 & 900 MG/3ML IM SUER
1.0000 | Freq: Once | INTRAMUSCULAR | Status: AC
  Administered 2024-01-01: 1 via INTRAMUSCULAR

## 2024-02-12 ENCOUNTER — Other Ambulatory Visit: Payer: Self-pay

## 2024-02-12 ENCOUNTER — Other Ambulatory Visit (HOSPITAL_COMMUNITY): Payer: Self-pay

## 2024-02-12 NOTE — Progress Notes (Signed)
Specialty Pharmacy Refill Coordination Note  Laura Jones is a 53 y.o. female assessed today regarding refills of clinic administered specialty medication(s) Cabotegravir & Rilpivirine (CABENUVA)   Clinic requested Courier to Provider Office   Delivery date: 02/26/24   Verified address: [address] [place] [address]   Medication will be filled on 02/25/24.

## 2024-02-25 ENCOUNTER — Other Ambulatory Visit: Payer: Self-pay

## 2024-02-26 ENCOUNTER — Telehealth: Payer: Self-pay

## 2024-02-26 NOTE — Telephone Encounter (Signed)
 [facility] Patient Advocate Encounter  Patient's medications CABENUVA have been couriered to [facility] from [facility] and will be administered at the patients appointment on 03/03/24.  Kae Heller, CPhT Specialty Pharmacy Patient [facility] Phone: [phone] Fax:  [phone]

## 2024-02-27 DIAGNOSIS — R1310 Dysphagia, unspecified: Secondary | ICD-10-CM | POA: Diagnosis not present

## 2024-02-27 DIAGNOSIS — Z6831 Body mass index (BMI) 31.0-31.9, adult: Secondary | ICD-10-CM | POA: Diagnosis not present

## 2024-02-27 DIAGNOSIS — K219 Gastro-esophageal reflux disease without esophagitis: Secondary | ICD-10-CM | POA: Diagnosis not present

## 2024-02-27 DIAGNOSIS — F330 Major depressive disorder, recurrent, mild: Secondary | ICD-10-CM | POA: Diagnosis not present

## 2024-02-27 DIAGNOSIS — F410 Panic disorder [episodic paroxysmal anxiety] without agoraphobia: Secondary | ICD-10-CM | POA: Diagnosis not present

## 2024-02-27 DIAGNOSIS — E7849 Other hyperlipidemia: Secondary | ICD-10-CM | POA: Diagnosis not present

## 2024-02-27 DIAGNOSIS — N182 Chronic kidney disease, stage 2 (mild): Secondary | ICD-10-CM | POA: Diagnosis not present

## 2024-02-27 DIAGNOSIS — I10 Essential (primary) hypertension: Secondary | ICD-10-CM | POA: Diagnosis not present

## 2024-02-27 DIAGNOSIS — Z0000 Encounter for general adult medical examination without abnormal findings: Secondary | ICD-10-CM | POA: Diagnosis not present

## 2024-03-02 NOTE — Progress Notes (Unsigned)
 HPI: Laura Jones is a 53 y.o. female who presents to the [facility] pharmacy clinic for [place] administration.  Patient Active Problem List   Diagnosis Date Noted   Dysphagia 07/25/2023   Elevated serum creatinine 05/30/2020   Anemia 05/30/2020   Liver fibrosis 05/27/2019   Closed bimalleolar fracture of left ankle 02/03/2019   VIN III (vulvar intraepithelial neoplasia III) 10/14/2018   OA (osteoarthritis) of hip 06/18/2018   Hypercholesteremia 12/08/2017   History of total right hip replacement 09/18/2017   Hepatitis C virus infection cured after antiviral drug therapy 07/30/2017   Acid reflux 07/30/2017   HIV (human immunodeficiency virus infection) (HCC) 07/29/2017   Cystocele, lateral 07/25/2016   Avascular necrosis of bone of hip, left (HCC) 09/13/2015   Erythropoietic porphyria (HCC) 09/13/2015   Depression 07/01/2015    Patient's Medications  New Prescriptions   No medications on file  Previous Medications   ALBUTEROL (VENTOLIN HFA) 108 (90 BASE) MCG/ACT INHALER    SMARTSIG:1 Puff(s) Via Inhaler 4 Times Daily PRN   AMLODIPINE (NORVASC) 5 MG TABLET    Take 5 mg by mouth daily.   ATORVASTATIN (LIPITOR) 10 MG TABLET    Take 10 mg by mouth at bedtime.   BUPROPION (ZYBAN) 150 MG 12 HR TABLET    Take 150 mg by mouth 2 (two) times daily.   BUSPIRONE (BUSPAR) 15 MG TABLET    Take 15 mg by mouth 2 (two) times daily.   CABOTEGRAVIR & RILPIVIRINE ER (CABENUVA) 600 & 900 MG/3ML INJECTION    Inject 1 kit into the muscle every 2 (two) months.   CHOLECALCIFEROL (VITAMIN D) 50 MCG (2000 UT) TABLET    Take 4,000 Units by mouth daily.    DICYCLOMINE (BENTYL) 10 MG CAPSULE    Take 10 mg by mouth 2 (two) times daily.    DIPHENHYDRAMINE (BENADRYL ALLERGY) 25 MG CAPSULE    Take 25 mg by mouth every 6 (six) hours as needed.   DIPHENHYDRAMINE-ACETAMINOPHEN (TYLENOL PM) 25-500 MG TABS TABLET    Take 1 tablet by mouth daily as needed.   FLUOXETINE (PROZAC) 40 MG CAPSULE    Take 40 mg by mouth  daily.   IBANDRONATE (BONIVA) 150 MG TABLET    Take 150 mg by mouth every 30 (thirty) days.   LOPERAMIDE (IMODIUM A-D) 2 MG TABLET    Take 2 mg by mouth as needed for diarrhea or loose stools.   METOPROLOL TARTRATE (LOPRESSOR) 25 MG TABLET    Take 25 mg by mouth 2 (two) times daily.   OMEGA-3 FATTY ACIDS (FISH OIL) 1200 MG CAPS    Take by mouth. Takes one bid   OMEPRAZOLE (PRILOSEC) 40 MG CAPSULE    TAKE 1 CAPSULE BY MOUTH TWICE A DAY   ONDANSETRON (ZOFRAN-ODT) 4 MG DISINTEGRATING TABLET    Take 1 tablet (4 mg total) by mouth every 8 (eight) hours as needed for nausea or vomiting.   SUCRALFATE (CARAFATE) 1 GM/10ML SUSPENSION    TAKE 10 MLS (1 G TOTAL) BY MOUTH 4 (FOUR) TIMES DAILY - WITH MEALS AND AT BEDTIME.   TIZANIDINE (ZANAFLEX) 4 MG TABLET    Take 4 mg by mouth at bedtime. As needed   TRAZODONE (DESYREL) 100 MG TABLET    Take 100-200 mg by mouth at bedtime.  Modified Medications   No medications on file  Discontinued Medications   No medications on file    Allergies: Allergies  Allergen Reactions   Levaquin [Levofloxacin] Rash    Difficulty  breathing, mouth swelling/rash   Compazine [Prochlorperazine Edisylate]     Muscle twitching   Droperidol     uncontrolled muscle movements    Imitrex [Sumatriptan]     Difficulty breathing, chest pain; All "triptans"   Promethazine    Reglan [Metoclopramide]     Uncontrolled twitching   Sulfa Antibiotics Hives    Hives    Tagamet (Premixed [Cimetidine]    Thorazine [Chlorpromazine]     Uncontrolled muscle twitching    Labs: Lab Results  Component Value Date   HIV1RNAQUANT Not Detected 11/05/2023   HIV1RNAQUANT Not Detected 05/02/2023   HIV1RNAQUANT <20 (H) 03/12/2023   CD4TABS 578 11/05/2023   CD4TABS 470 05/02/2023   CD4TABS 350 (L) 11/08/2022    RPR and STI Lab Results  Component Value Date   LABRPR NON-REACTIVE 05/10/2020    STI Results GC CT  05/10/2020 11:08 AM Negative  Negative     Hepatitis B Lab Results   Component Value Date   HEPBSAB REACTIVE (A) 03/12/2023   HEPBSAG NON-REACTIVE 07/29/2017   Hepatitis C No results found for: "HEPCAB", "HCVRNAPCRQN" Hepatitis A Lab Results  Component Value Date   HAV REACTIVE (A) 07/29/2017   Lipids: Lab Results  Component Value Date   CHOL 185 05/02/2023   TRIG 336 (H) 05/02/2023   HDL 45 (L) 05/02/2023   CHOLHDL 4.1 05/02/2023   VLDL 65 (H) 07/29/2017   LDLCALC 95 05/02/2023    TARGET DATE: The 14th  Assessment: Miosotis presents today for her maintenance Cabenuva injections. Past injections were tolerated well without issues. Last HIV RNA was not detected in November. She recently had her blood pressure medications changed by her PCP. I updated her list. Still having itching with the injection and uses hydrocortisone cream and benadryl which provides relief.  Administered cabotegravir 600mg /70mL in left upper outer quadrant of the gluteal muscle. Administered rilpivirine 900 mg/32mL in the right upper outer quadrant of the gluteal muscle. No issues with injections. She will follow up in 2 months for next set of injections.   Plan: - Cabenuva injections administered - Next injections scheduled for 04/29/24 with Judeth Cornfield and 06/30/24 with me - Call with any issues or questions  Britanie Harshman L. Yamileth Hayse, PharmD, BCIDP, AAHIVP, CPP Clinical Pharmacist Practitioner Infectious Diseases Clinical Pharmacist [facility]

## 2024-03-03 ENCOUNTER — Ambulatory Visit: Payer: Medicare HMO | Admitting: Pharmacist

## 2024-03-03 ENCOUNTER — Other Ambulatory Visit: Payer: Self-pay

## 2024-03-03 DIAGNOSIS — R110 Nausea: Secondary | ICD-10-CM

## 2024-03-03 DIAGNOSIS — Z21 Asymptomatic human immunodeficiency virus [HIV] infection status: Secondary | ICD-10-CM | POA: Diagnosis not present

## 2024-03-03 MED ORDER — ONDANSETRON 4 MG PO TBDP: 4.0000 mg | ORAL_TABLET | Freq: Three times a day (TID) | ORAL | 2 refills | Status: AC | PRN

## 2024-03-03 MED ORDER — CABOTEGRAVIR & RILPIVIRINE ER 600 & 900 MG/3ML IM SUER
1.0000 | Freq: Once | INTRAMUSCULAR | Status: AC
  Administered 2024-03-03: 1 via INTRAMUSCULAR

## 2024-03-13 ENCOUNTER — Encounter (INDEPENDENT_AMBULATORY_CARE_PROVIDER_SITE_OTHER): Payer: Self-pay | Admitting: *Deleted

## 2024-03-25 DIAGNOSIS — Z1231 Encounter for screening mammogram for malignant neoplasm of breast: Secondary | ICD-10-CM | POA: Diagnosis not present

## 2024-03-30 ENCOUNTER — Ambulatory Visit (INDEPENDENT_AMBULATORY_CARE_PROVIDER_SITE_OTHER): Admitting: Gastroenterology

## 2024-03-30 ENCOUNTER — Encounter (INDEPENDENT_AMBULATORY_CARE_PROVIDER_SITE_OTHER): Payer: Self-pay | Admitting: Gastroenterology

## 2024-03-30 VITALS — BP 146/70 | HR 80 | Temp 98.5°F | Ht 61.0 in | Wt 175.8 lb

## 2024-03-30 DIAGNOSIS — Z8619 Personal history of other infectious and parasitic diseases: Secondary | ICD-10-CM | POA: Diagnosis not present

## 2024-03-30 DIAGNOSIS — K7460 Unspecified cirrhosis of liver: Secondary | ICD-10-CM

## 2024-03-30 DIAGNOSIS — K219 Gastro-esophageal reflux disease without esophagitis: Secondary | ICD-10-CM | POA: Diagnosis not present

## 2024-03-30 DIAGNOSIS — K7400 Hepatic fibrosis, unspecified: Secondary | ICD-10-CM

## 2024-03-30 NOTE — Progress Notes (Addendum)
 Referring Provider: Toma Deiters, MD Primary Care Physician:  Toma Deiters, MD Primary GI Physician: Dr. Levon Hedger   Chief Complaint  Patient presents with   Gastroesophageal Reflux    Follow up on GERD. Taking omeprazole bid and states her pcp added famotidine at night. Takes carafate as needed.    HPI:   Laura Jones is a 53 y.o. female with past medical history of HIV, HCV status post treatment failure with combination of pegasys/RBV status post open treatment with SVR, advanced liver fibrosis secondary to HCV   Patient presenting today for:  Follow up of GERD/epigastric pain and advanced liver fibrosis secondary to HCV  History: Liver biopsy performed in 2003 that showed early cirrhosis per patient report. initial elastography showed F2/F3 fibrosis.  She has been following with the infectious disease  for her advanced liver fibrosis. currently following with the HIV clinic in [facility] health.  Reportedly had treatment in the past for hep C and achieved SVR in 2018.  Currently on treatment with Cabenuva.  Last viral load for HIV in November  was undetectable  Last seen October 2024, at that time patient reported resolution of dysphagia since EGD.  Reports of intermittent epigastric pain.  Has vomiting at times.  Also regurgitation of food but denies heartburn.  On Dexilant 60 mg daily 34 mg at bedtime.  Patient recommended to stop Dexilant, start omeprazole 40 mg twice daily, Carafate 4 times daily, keep scheduled ultrasound on 10/9  Ultrasound elastography as outlined below, recommend to have repeat ultrasound every 6 months.  CMP in November with Alk phos 45, AST 15, ALT 9, T bili 0.2, plt count 237k in September   Present: States she is doing okay on omeprazole 40mg  BID and carafate 1g up to 4 times per day. She notes that she went to PCP and he put her back on famotidine in the evenings when she saw them in December. She notes some occasional discomfort in her epigastric  area, sometimes will belch, this occurs now only on occasion. Feels symptoms are much improved from previous. Denies any dysphagia or odynophagia.  She has very rare nausea which she takes zofran for but notes she does not use this very often. Takes Bentyl BID with good results.   She has some issues with constipation on occasion but does not feel this is bad enough that she needs to take anything for it.   No red flag symptoms. Patient denies melena, hematochezia, nausea, vomiting, diarrhea, dysphagia, odyonophagia, early satiety or weight loss.  Ultrasound liver elastography: 10/02/2023, median kPA 9.3, markedly limited but grossly unremarkable visualized portion of the liver Last Colonoscopy:2021 normal  Last Endoscopy:07/2023- No endoscopic esophageal abnormality to explain                            patient's dysphagia. Esophagus dilated. Biopsied                           - 1 cm hiatal hernia.                           - Gastroesophageal flap valve classified as Hill                            Grade III (minimal fold, loose to endoscope, hiatal  hernia likely).                           - Three gastric polyps-unremarkable squamous mucosa                            - Normal examined duodenum.   Recommendations:  Repeat colonoscopy 10 years  Filed Weights   03/30/24 1034  Weight: 175 lb 12.8 oz (79.7 kg)     Past Medical History:  Diagnosis Date   Anemia    hx of    Anxiety    Arthritis    left knee   Avascular necrosis of bone of hip, left (HCC)    Bulging lumbar disc    Depression    Dyspnea    with excertion   GERD (gastroesophageal reflux disease)    Hepatitis    C treated 2 years ago went undetected   History of kidney stones    HIV (human immunodeficiency virus infection) (HCC) 07/29/2017   HIV infection (HCC)    Insomnia    Migraines    Nerve pain    Perimenopausal    Porphyria (HCC)    PTSD (post-traumatic stress disorder)    Spinal  stenosis of lumbosacral region    Tendonitis    right arm    Trauma     Past Surgical History:  Procedure Laterality Date   ABDOMINAL HYSTERECTOMY     ANKLE SURGERY Left 01/26/2019   BIOPSY  08/14/2023   Procedure: BIOPSY;  Surgeon: Dolores Frame, MD;  Location: [facility];  Service: Gastroenterology;;   CESAREAN SECTION     ESOPHAGOGASTRODUODENOSCOPY     x2   ESOPHAGOGASTRODUODENOSCOPY (EGD) WITH PROPOFOL N/A 08/14/2023   Procedure: ESOPHAGOGASTRODUODENOSCOPY (EGD) WITH PROPOFOL;  Surgeon: Dolores Frame, MD;  Location: [facility];  Service: Gastroenterology;  Laterality: N/A;  7:30am;asa 1   INCONTINENCE SURGERY     JOINT REPLACEMENT     left hip replacement Dr. Lequita Halt 06-18-18   SAVORY DILATION  08/14/2023   Procedure: SAVORY DILATION;  Surgeon: Dolores Frame, MD;  Location: [facility];  Service: Gastroenterology;;   TOTAL HIP ARTHROPLASTY Right 02/2013   TOTAL HIP ARTHROPLASTY Left 06/18/2018   Procedure: LEFT TOTAL HIP ARTHROPLASTY ANTERIOR APPROACH;  Surgeon: Ollen Gross, MD;  Location: [facility];  Service: Orthopedics;  Laterality: Left;   TUBAL LIGATION     URETHRAL SLING     VULVECTOMY PARTIAL N/A 08/19/2019   Procedure: VULVECTOMY PARTIAL;  Surgeon: Lazaro Arms, MD;  Location: [facility];  Service: Gynecology;  Laterality: N/A;    Current Outpatient Medications  Medication Sig Dispense Refill   albuterol (VENTOLIN HFA) 108 (90 Base) MCG/ACT inhaler SMARTSIG:1 Puff(s) Via Inhaler 4 Times Daily PRN     atorvastatin (LIPITOR) 10 MG tablet Take 10 mg by mouth at bedtime.  0   buPROPion (ZYBAN) 150 MG 12 hr tablet Take 150 mg by mouth 2 (two) times daily.     busPIRone (BUSPAR) 15 MG tablet Take 15 mg by mouth 2 (two) times daily.  0   cabotegravir & rilpivirine ER (CABENUVA) 600 & 900 MG/3ML injection Inject 1 kit into the muscle every 2 (two) months. 6 mL 5   Cholecalciferol (VITAMIN D) 50 MCG (2000 UT) tablet Take 4,000 Units by  mouth daily.      dicyclomine (BENTYL) 10 MG capsule Take 10 mg by mouth 2 (  two) times daily.      diphenhydrAMINE (BENADRYL ALLERGY) 25 mg capsule Take 25 mg by mouth every 6 (six) hours as needed.     diphenhydramine-acetaminophen (TYLENOL PM) 25-500 MG TABS tablet Take 1 tablet by mouth daily as needed.     famotidine (PEPCID) 40 MG tablet Take 40 mg by mouth at bedtime.     FLUoxetine (PROZAC) 40 MG capsule Take 40 mg by mouth daily.     ibandronate (BONIVA) 150 MG tablet Take 150 mg by mouth every 30 (thirty) days.     loperamide (IMODIUM A-D) 2 MG tablet Take 2 mg by mouth as needed for diarrhea or loose stools.     metoprolol tartrate (LOPRESSOR) 25 MG tablet Take 25 mg by mouth 2 (two) times daily.     olmesartan (BENICAR) 20 MG tablet Take 20 mg by mouth daily.     Omega-3 Fatty Acids (FISH OIL) 1200 MG CAPS Take by mouth. Takes one bid     omeprazole (PRILOSEC) 40 MG capsule TAKE 1 CAPSULE BY MOUTH TWICE A DAY 180 capsule 1   ondansetron (ZOFRAN-ODT) 4 MG disintegrating tablet Take 1 tablet (4 mg total) by mouth every 8 (eight) hours as needed for nausea or vomiting. 30 tablet 2   sucralfate (CARAFATE) 1 GM/10ML suspension TAKE 10 MLS (1 G TOTAL) BY MOUTH 4 (FOUR) TIMES DAILY - WITH MEALS AND AT BEDTIME. 420 mL 1   tiZANidine (ZANAFLEX) 4 MG tablet Take 4 mg by mouth at bedtime. As needed     traZODone (DESYREL) 100 MG tablet Take 100-200 mg by mouth at bedtime.     No current facility-administered medications for this visit.    Allergies as of 03/30/2024 - Review Complete 03/30/2024  Allergen Reaction Noted   Levaquin [levofloxacin] Rash 07/29/2017   Compazine [prochlorperazine edisylate]  07/29/2017   Droperidol  07/29/2017   Imitrex [sumatriptan]  07/29/2017   Promethazine  09/01/2022   Reglan [metoclopramide]  07/29/2017   Sulfa antibiotics Hives 07/29/2017   Tagamet (premixed [cimetidine]  08/15/2021   Thorazine [chlorpromazine]  07/29/2017    Social History    Socioeconomic History   Marital status: Divorced    Spouse name: Not on file   Number of children: Not on file   Years of education: Not on file   Highest education level: Not on file  Occupational History   Not on file  Tobacco Use   Smoking status: Never    Passive exposure: Past   Smokeless tobacco: Never  Vaping Use   Vaping status: Never Used  Substance and Sexual Activity   Alcohol use: No   Drug use: No   Sexual activity: Not Currently    Birth control/protection: Surgical    Comment: declined condoms (hyst)  Other Topics Concern   Not on file  Social History Narrative   Not on file   Social Drivers of Health   Financial Resource Strain: Not on file  Food Insecurity: Not on file  Transportation Needs: Not on file  Physical Activity: Not on file  Stress: Not on file  Social Connections: Unknown (04/23/2022)   Received from [employer], [facility]   Social Network    Social Network: Not on file    Review of systems General: negative for malaise, night sweats, fever, chills, weight loss Neck: Negative for lumps, goiter, pain and significant neck swelling Resp: Negative for cough, wheezing, dyspnea at rest CV: Negative for chest pain, leg swelling, palpitations, orthopnea GI: denies melena, hematochezia,  nausea, vomiting, diarrhea, constipation, dysphagia, odyonophagia, early satiety or unintentional weight loss.  MSK: Negative for joint pain or swelling, back pain, and muscle pain. Derm: Negative for itching or rash Psych: Denies depression, anxiety, memory loss, confusion. No homicidal or suicidal ideation.  Heme: Negative for prolonged bleeding, bruising easily, and swollen nodes. Endocrine: Negative for cold or heat intolerance, polyuria, polydipsia and goiter. Neuro: negative for tremor, gait imbalance, syncope and seizures. The remainder of the review of systems is noncontributory.  Physical Exam: BP (!) 146/70   Pulse 80   Temp 98.5 F (36.9 C)  (Oral)   Ht 5\' 1"  (1.549 m)   Wt 175 lb 12.8 oz (79.7 kg)   LMP 12/25/2003   BMI 33.22 kg/m  General:   Alert and oriented. No distress noted. Pleasant and cooperative.  Head:  Normocephalic and atraumatic. Eyes:  Conjuctiva clear without scleral icterus. Mouth:  Oral mucosa pink and moist. Good dentition. No lesions. Heart: Normal rate and rhythm, s1 and s2 heart sounds present.  Lungs: Clear lung sounds in all lobes. Respirations equal and unlabored. Abdomen:  +BS, soft, non-tender and non-distended. No rebound or guarding. No HSM or masses noted. Derm: No palmar erythema or jaundice Msk:  Symmetrical without gross deformities. Normal posture. Extremities:  Without edema. Neurologic:  Alert and  oriented x4 Psych:  Alert and cooperative. Normal mood and affect.  Invalid input(s): "6 MONTHS"   ASSESSMENT: Laura Jones is a 53 y.o. female presenting today for follow up of GERD/epigastric pain and advanced liver fibrosis  GERD/epigastric pain has improved esomeprazole 40 mg twice daily and Carafate as needed up to 4 times a day.  She notes symptoms are much better than previously especially using Carafate.  Relatively recent EGD as outlined above.  She notes PCP restarted famotidine which she is unsure if she needs.  We discussed she can try stopping famotidine, if she were to have recurrence of GERD symptoms/epigastric pain she can resume famotidine.  History of Hep C for which she achieved SVR in the past with oral treatment (unclear what treatment she receive). Previous biopsy showed "early cirrhosis" and elastography int he past showed possible F3 fibrosis, most recent Elastography in October with elevated Kpa of 9.3, though study was limited, therefore she has been recommended to have [place] every 6 months to monitor her liver disease. Reassuringly her LFTs have remained well WNL. Will update RUQ [place], could consider HCV fibrosure test in the future to help better evaluate her level of  fibrosis.    PLAN:  -RUQ [place] -may consider HCV fibrosure -continue omeprazole 40mg  BID -continue carafate 1g up to QID -try stopping famotidine   All questions were answered, patient verbalized understanding and is in agreement with plan as outlined above.   Follow Up: 6 months   Versie Soave L. Jeanmarie Hubert, MSN, APRN, AGNP-C Adult-Gerontology Nurse Practitioner [facility]  I have reviewed the note and agree with the APP's assessment as described in this progress note  At this point, I would favor monitoring her with CBC, MELD labs, AFP and US liver every 6 months given possible early cirrhosis.  Katrinka Blazing, MD Gastroenterology and Hepatology [facility]

## 2024-03-30 NOTE — Patient Instructions (Signed)
-  we will update RUQ [place] -continue omeprazole 40mg  twice daily  -continue carafate 1g up to 4 times per day -try stopping famotidine to see how you do with this. If you have recurrent GERD symptoms or abdominal pain, you can restart it  Follow up 6 months  It was a pleasure to see you today. I want to create trusting relationships with patients and provide genuine, compassionate, and quality care. I truly value your feedback! please be on the lookout for a survey regarding your visit with me today. I appreciate your input about our visit and your time in completing this!    Clarity Ciszek L. Jeanmarie Hubert, MSN, APRN, AGNP-C Adult-Gerontology Nurse Practitioner [facility] at [facility]

## 2024-03-31 ENCOUNTER — Other Ambulatory Visit (INDEPENDENT_AMBULATORY_CARE_PROVIDER_SITE_OTHER): Payer: Self-pay | Admitting: Gastroenterology

## 2024-03-31 ENCOUNTER — Telehealth (INDEPENDENT_AMBULATORY_CARE_PROVIDER_SITE_OTHER): Payer: Self-pay

## 2024-03-31 DIAGNOSIS — K7400 Hepatic fibrosis, unspecified: Secondary | ICD-10-CM

## 2024-03-31 NOTE — Telephone Encounter (Signed)
 Carlan, Jeral Pinch, NP  Maximillion Gill L, CMA Can we ask patient to do CMP, CBC, AFP and INR please, in regards to her liver. I will place orders( orders were placed in Epic by Doylene Bode, NP for [facility]).

## 2024-03-31 NOTE — Telephone Encounter (Signed)
 I spoke with the patient and made her aware per [facility], Please have her labs drawn at [facility] lab at her convenience. Patient states understanding.

## 2024-04-10 ENCOUNTER — Ambulatory Visit (HOSPITAL_COMMUNITY)

## 2024-04-14 ENCOUNTER — Other Ambulatory Visit (HOSPITAL_COMMUNITY): Payer: Self-pay

## 2024-04-14 ENCOUNTER — Other Ambulatory Visit: Payer: Self-pay

## 2024-04-14 NOTE — Progress Notes (Signed)
 Specialty Pharmacy Refill Coordination Note  Laura Jones is a 53 y.o. female assessed today regarding refills of clinic administered specialty medication(s) Cabotegravir  & Rilpivirine  (CABENUVA )   Clinic requested Courier to Provider Office   Delivery date: 04/22/24   Verified address: [address] [place] [address]   Medication will be filled on 04/21/24.

## 2024-04-19 ENCOUNTER — Other Ambulatory Visit (INDEPENDENT_AMBULATORY_CARE_PROVIDER_SITE_OTHER): Payer: Self-pay | Admitting: Gastroenterology

## 2024-04-21 ENCOUNTER — Other Ambulatory Visit: Payer: Self-pay

## 2024-04-22 ENCOUNTER — Ambulatory Visit (HOSPITAL_COMMUNITY)

## 2024-04-22 ENCOUNTER — Telehealth: Payer: Self-pay

## 2024-04-22 NOTE — Telephone Encounter (Signed)
 [facility] Patient Advocate Encounter  Patient's medications CABENUVA  have been couriered to [facility] from [facility] and will be administered at the patients appointment on 04/29/24.  Verline Glow, CPhT Specialty Pharmacy Patient [facility] for Infectious Disease Phone: [phone] Fax:  [phone]

## 2024-04-29 ENCOUNTER — Encounter: Payer: Self-pay | Admitting: Infectious Diseases

## 2024-04-29 ENCOUNTER — Other Ambulatory Visit: Payer: Self-pay

## 2024-04-29 ENCOUNTER — Ambulatory Visit: Admitting: Infectious Diseases

## 2024-04-29 VITALS — BP 104/69 | HR 65 | Temp 97.4°F | Ht 60.0 in | Wt 177.0 lb

## 2024-04-29 DIAGNOSIS — Z21 Asymptomatic human immunodeficiency virus [HIV] infection status: Secondary | ICD-10-CM | POA: Diagnosis not present

## 2024-04-29 DIAGNOSIS — K7460 Unspecified cirrhosis of liver: Secondary | ICD-10-CM

## 2024-04-29 DIAGNOSIS — B20 Human immunodeficiency virus [HIV] disease: Secondary | ICD-10-CM | POA: Diagnosis not present

## 2024-04-29 DIAGNOSIS — Z8619 Personal history of other infectious and parasitic diseases: Secondary | ICD-10-CM

## 2024-04-29 MED ORDER — CABOTEGRAVIR & RILPIVIRINE ER 600 & 900 MG/3ML IM SUER
1.0000 | Freq: Once | INTRAMUSCULAR | Status: AC
Start: 2024-04-29 — End: 2024-04-29
  Administered 2024-04-29: 1 via INTRAMUSCULAR

## 2024-04-29 NOTE — Progress Notes (Unsigned)
 Patient Name: Laura Jones  Date of Birth: November 13, 1971 MRN: [id number]  PCP: Veda Gerald, MD    SUBJECTIVE:  Brief Narrative:   Laura Jones is a 53 y.o. female with HIV disease, dx "a long time ago." Relocated from [place] in 2018 where she was in care with [facility].  History of Hep C, cured (s/p treatment x 2, HCV Quant < 15, 07/29/2017).   +Cirrhosis per notes from previous liver biopsies  S/P hysterectomy Hep B sAg (-), Hep B sAb (-) following several attempts to vaccinate.  History of OIs: none known.  HIV Risk: heterosexual contact  Previous Regimens:  Prezista + Norvir + Truvada  Genvoya  >> suppressed  Biktarvy  09/2018 >> switched d/t DDI with medications Cabenuva  injections, 11/2021   Genotype:  K103N - NNRTI resistance per chart records    Subjective  Discussed the use of AI scribe software for clinical note transcription with the patient, who gave verbal consent to proceed.  History of Present Illness   Laura Jones is a 53 year old female with HIV who presents for a routine follow-up and cabenuva  injections.   She has been on Cabenuva  for almost three years, experiencing mild itching and fatigue for about a day post-injection, which she finds manageable. She continues this regimen due to its benefits, particularly given her gastrointestinal issues and not having to worry about absorbing oral ART.   She is under the care of a GI team and takes omeprazole  twice daily and Carafate . Her reflux symptoms have improved, with a reduction in the burning sensation, but she still experiences occasional dysphagia, feeling as though something gets stuck in her throat. She underwent a throat dilation, but a motility study or esophageal manometry has not been performed.  She has a history of liver fibrosis (F3 on biopsy 2003), with an elastography in October showing some liver stiffness but compensated disease. No mass/tumor.  She is following regularly  with GI who has helped arrange next U/S.   She has a chronic cough attributed to reflux, previously thought to be asthma. She also mentions a small hernia and wonders if it might contribute to her swallowing issues. She still feels as if food/pills gets hung up in her lower throat/chest. Wonders if it is due to anxiety or esophageal mobility issue. The Carafate  really helps her stomach pain.         04/29/2024    2:30 PM 11/05/2023   10:38 AM 09/10/2022   11:39 AM  Depression screen PHQ 2/9  Decreased Interest 1 0 0  Down, Depressed, Hopeless 1 0 0  PHQ - 2 Score 2 0 0  Altered sleeping 1    Tired, decreased energy 1    Change in appetite 1    Feeling bad or failure about yourself  1    Trouble concentrating 1    Moving slowly or fidgety/restless 1    Suicidal thoughts 0    PHQ-9 Score 8    Difficult doing work/chores Somewhat difficult         04/29/2024    2:30 PM  GAD 7 : Generalized Anxiety Score  Nervous, Anxious, on Edge 2  Control/stop worrying 2  Worry too much - different things 2  Trouble relaxing 2  Restless 2  Easily annoyed or irritable 2  Afraid - awful might happen 2  Total GAD 7 Score 14  Anxiety Difficulty Somewhat difficult      Review of Systems  Constitutional:  Negative for chills and  fever.  HENT:  Negative for tinnitus.   Eyes:  Negative for photophobia.  Respiratory:  Negative for cough.   Cardiovascular:  Negative for chest pain.  Gastrointestinal:  Negative for diarrhea, nausea and vomiting.  Genitourinary:  Negative for dysuria.  Skin:  Negative for rash.       Itching as per HPI  Neurological:  Negative for headaches.    Past Medical History:  Diagnosis Date   Anemia    hx of    Anxiety    Arthritis    left knee   Avascular necrosis of bone of hip, left (HCC)    Bulging lumbar disc    Depression    Dyspnea    with excertion   GERD (gastroesophageal reflux disease)    Hepatitis    C treated 2 years ago went undetected    History of kidney stones    HIV (human immunodeficiency virus infection) (HCC) 07/29/2017   HIV infection (HCC)    Insomnia    Migraines    Nerve pain    Perimenopausal    Porphyria (HCC)    PTSD (post-traumatic stress disorder)    Spinal stenosis of lumbosacral region    Tendonitis    right arm    Trauma     Social History   Tobacco Use   Smoking status: Never    Passive exposure: Past   Smokeless tobacco: Never  Vaping Use   Vaping status: Never Used  Substance Use Topics   Alcohol use: No   Drug use: No     Allergies  Allergen Reactions   Levaquin [Levofloxacin] Rash    Difficulty breathing, mouth swelling/rash   Compazine [Prochlorperazine Edisylate]     Muscle twitching   Droperidol     uncontrolled muscle movements    Imitrex [Sumatriptan]     Difficulty breathing, chest pain; All "triptans"   Promethazine     Reglan [Metoclopramide]     Uncontrolled twitching   Sulfa Antibiotics Hives    Hives    Tagamet (Premixed [Cimetidine]    Thorazine [Chlorpromazine]     Uncontrolled muscle twitching    Objective   Objective:  Vitals:   04/29/24 1425  BP: 104/69  Pulse: 65  Temp: (!) 97.4 F (36.3 C)  TempSrc: Temporal  SpO2: 96%  Weight: 177 lb (80.3 kg)  Height: 5' (1.524 m)   Body mass index is 34.57 kg/m.   Physical Exam Constitutional:      Appearance: Normal appearance. She is not ill-appearing.  HENT:     Mouth/Throat:     Mouth: Mucous membranes are moist.     Pharynx: Oropharynx is clear.  Eyes:     General: No scleral icterus. Cardiovascular:     Rate and Rhythm: Normal rate and regular rhythm.  Pulmonary:     Effort: Pulmonary effort is normal.  Neurological:     Mental Status: She is oriented to person, place, and time.  Psychiatric:        Mood and Affect: Mood normal.        Thought Content: Thought content normal.     Lab Results HIV 1 RNA Quant (Copies/mL)  Date Value  11/05/2023 Not Detected  05/02/2023 Not Detected   03/12/2023 <20 (H)   CD4 T Cell Abs (/uL)  Date Value  11/05/2023 578  05/02/2023 470  11/08/2022 350 (L)   Lab Results  Component Value Date   ALT 12 04/29/2024   AST 17 04/29/2024   ALKPHOS 95 12/19/2019  BILITOT 0.2 04/29/2024   Lab Results  Component Value Date   CREATININE 1.03 04/29/2024   CREATININE 1.00 11/05/2023   CREATININE 1.17 (H) 05/02/2023       ASSESSMENT & PLAN:    HIV infection - HIV infection is well-controlled with Cabenuva -LA Q46m. She reports mild side effects of itching and fatigue post-injection, but these are not significant enough to warrant discontinuation. Cabenuva  is preferred due to its reduced interaction with other medications, especially given her gastrointestinal issues.  CD4 has recovered well and consistently > 400. VL has maintained undetectable levels.  She is up to date on vaccines Has GYN follow up with vaginal dysplasia history  Follows with PCP regularly as well.  - Continue Cabenuva  Q51m Odd months - Update blood work today to monitor HIV status and overall health.  Hepatitis C, Cured by DAAs -  S/P treated and cured. Liver function tests and elastography show no significant progression of liver stiffness or evidence of cirrhosis. She seems well compensated. Regular monitoring is in place to assess for any potential liver tumors due to past fibrosis. - Perform liver ultrasound on May 9th as scheduled with GI - Conduct blood tests including AFP, liver function, and CBC to monitor liver health and screen for tumors. --> will forward results to GI team per her request.   Esophageal dysphagia Intermittent sensation of food getting stuck in the esophagus, possibly related to anxiety or esophageal motility issues. Previous endoscopy and dilation showed no significant findings. She is advised to discuss the possibility of esophageal manometry with the GI specialist to assess esophageal motility. - Discuss utility of esophageal manometry  with GI specialist to evaluate esophageal motility.  Recording duration: 12 minutes      Meds ordered this encounter  Medications   cabotegravir  & rilpivirine  ER (CABENUVA ) 600 & 900 MG/3ML injection 1 kit     Orders Placed This Encounter  Procedures   HIV 1 RNA quant-no reflex-bld   T-helper cells (CD4) count   RPR   AFP tumor marker   Protime-INR   COMPLETE METABOLIC PANEL WITHOUT GFR   CBC w/Diff   Meds ordered this encounter  Medications   cabotegravir  & rilpivirine  ER (CABENUVA ) 600 & 900 MG/3ML injection 1 kit    Gibson Kurtz, MSN, NP-C [facility] for Infectious Disease [facility]  [email or web address] Pager: [phone] Office: [phone] [facility] Main Line: [phone]

## 2024-04-29 NOTE — Patient Instructions (Addendum)
 Mental Health Resources  988: can call or text 24/7  [facility]: Address: [address] [place], [place] [address] Open 24 hours Phone: [phone]  [facility]: Address: [address], [place], [place] [address] Phone: [phone] Appointments: [email or web address]   esophageal manometry is a motility study that you may want to ask the GI team about   Your next injection appointment is 06/30/2024  I will forward results to your GI team from today

## 2024-04-30 LAB — T-HELPER CELLS (CD4) COUNT (NOT AT ARMC)
CD4 % Helper T Cell: 30 % — ABNORMAL LOW (ref 33–65)
CD4 T Cell Abs: 663 /uL (ref 400–1790)

## 2024-05-01 ENCOUNTER — Ambulatory Visit (HOSPITAL_COMMUNITY)
Admission: RE | Admit: 2024-05-01 | Discharge: 2024-05-01 | Disposition: A | Discharge status: Home / Self Care | Source: Ambulatory Visit | Attending: Gastroenterology | Admitting: Gastroenterology

## 2024-05-01 DIAGNOSIS — K7400 Hepatic fibrosis, unspecified: Secondary | ICD-10-CM | POA: Diagnosis not present

## 2024-05-01 DIAGNOSIS — R932 Abnormal findings on diagnostic imaging of liver and biliary tract: Secondary | ICD-10-CM | POA: Diagnosis not present

## 2024-05-01 LAB — HIV-1 RNA QUANT-NO REFLEX-BLD
HIV 1 RNA Quant: NOT DETECTED {copies}/mL
HIV-1 RNA Quant, Log: NOT DETECTED {Log_copies}/mL

## 2024-05-01 LAB — PROTIME-INR
INR: 1
Prothrombin Time: 10.5 s (ref 9.0–11.5)

## 2024-05-01 LAB — CBC WITH DIFFERENTIAL/PLATELET
Absolute Lymphocytes: 2449 {cells}/uL (ref 850–3900)
Absolute Monocytes: 419 {cells}/uL (ref 200–950)
Basophils Absolute: 53 {cells}/uL (ref 0–200)
Basophils Relative: 0.9 %
Eosinophils Absolute: 83 {cells}/uL (ref 15–500)
Eosinophils Relative: 1.4 %
HCT: 38.9 % (ref 35.0–45.0)
Hemoglobin: 12.8 g/dL (ref 11.7–15.5)
MCH: 30.2 pg (ref 27.0–33.0)
MCHC: 32.9 g/dL (ref 32.0–36.0)
MCV: 91.7 fL (ref 80.0–100.0)
MPV: 11.3 fL (ref 7.5–12.5)
Monocytes Relative: 7.1 %
Neutro Abs: 2897 {cells}/uL (ref 1500–7800)
Neutrophils Relative %: 49.1 %
Platelets: 245 10*3/uL (ref 140–400)
RBC: 4.24 10*6/uL (ref 3.80–5.10)
RDW: 13.9 % (ref 11.0–15.0)
Total Lymphocyte: 41.5 %
WBC: 5.9 10*3/uL (ref 3.8–10.8)

## 2024-05-01 LAB — COMPLETE METABOLIC PANEL WITHOUT GFR
AG Ratio: 1.4 (calc) (ref 1.0–2.5)
ALT: 12 U/L (ref 6–29)
AST: 17 U/L (ref 10–35)
Albumin: 4.5 g/dL (ref 3.6–5.1)
Alkaline phosphatase (APISO): 52 U/L (ref 37–153)
BUN: 8 mg/dL (ref 7–25)
CO2: 29 mmol/L (ref 20–32)
Calcium: 9.6 mg/dL (ref 8.6–10.4)
Chloride: 102 mmol/L (ref 98–110)
Creat: 1.03 mg/dL (ref 0.50–1.03)
Globulin: 3.2 g/dL (ref 1.9–3.7)
Glucose, Bld: 77 mg/dL (ref 65–99)
Potassium: 3.8 mmol/L (ref 3.5–5.3)
Sodium: 138 mmol/L (ref 135–146)
Total Bilirubin: 0.2 mg/dL (ref 0.2–1.2)
Total Protein: 7.7 g/dL (ref 6.1–8.1)

## 2024-05-01 LAB — RPR: RPR Ser Ql: NONREACTIVE

## 2024-05-01 LAB — AFP TUMOR MARKER: AFP-Tumor Marker: 3.8 ng/mL

## 2024-05-04 ENCOUNTER — Other Ambulatory Visit (INDEPENDENT_AMBULATORY_CARE_PROVIDER_SITE_OTHER): Payer: Self-pay | Admitting: Gastroenterology

## 2024-05-04 DIAGNOSIS — K7400 Hepatic fibrosis, unspecified: Secondary | ICD-10-CM

## 2024-05-04 DIAGNOSIS — Z8619 Personal history of other infectious and parasitic diseases: Secondary | ICD-10-CM

## 2024-05-08 ENCOUNTER — Ambulatory Visit: Payer: Self-pay | Admitting: Infectious Diseases

## 2024-05-11 DIAGNOSIS — Z8619 Personal history of other infectious and parasitic diseases: Secondary | ICD-10-CM | POA: Diagnosis not present

## 2024-05-11 DIAGNOSIS — K7400 Hepatic fibrosis, unspecified: Secondary | ICD-10-CM | POA: Diagnosis not present

## 2024-05-11 NOTE — Progress Notes (Signed)
 The 10-year ASCVD risk score (Arnett DK, et al., 2019) is: 1.8%   Values used to calculate the score:     Age: 53 years     Sex: Female     Is Non-Hispanic African American: No     Diabetic: No     Tobacco smoker: No     Systolic Blood Pressure: 104 mmHg     Is BP treated: Yes     HDL Cholesterol: 45 mg/dL     Total Cholesterol: 185 mg/dL  Currently prescribed atorvastatin  10 mg.   Diann Bangerter, BSN, RN

## 2024-05-13 ENCOUNTER — Ambulatory Visit (INDEPENDENT_AMBULATORY_CARE_PROVIDER_SITE_OTHER): Payer: Self-pay | Admitting: Gastroenterology

## 2024-05-13 LAB — HCV FIBROSURE
ALPHA 2-MACROGLOBULINS, QN: 391 mg/dL — ABNORMAL HIGH (ref 110–276)
ALT (SGPT) P5P: 20 IU/L (ref 0–40)
Apolipoprotein A-1: 160 mg/dL (ref 116–209)
Bilirubin, Total: 0.3 mg/dL (ref 0.0–1.2)
Fibrosis Score: 0.26 — ABNORMAL HIGH (ref 0.00–0.21)
GGT: 22 IU/L (ref 0–60)
Haptoglobin: 196 mg/dL (ref 33–346)
Necroinflammat Activity Score: 0.08 (ref 0.00–0.17)

## 2024-05-22 ENCOUNTER — Other Ambulatory Visit (HOSPITAL_COMMUNITY): Payer: Self-pay | Admitting: Orthopedic Surgery

## 2024-05-22 DIAGNOSIS — M79651 Pain in right thigh: Secondary | ICD-10-CM | POA: Diagnosis not present

## 2024-05-22 DIAGNOSIS — T84030A Mechanical loosening of internal right hip prosthetic joint, initial encounter: Secondary | ICD-10-CM

## 2024-05-25 ENCOUNTER — Ambulatory Visit (HOSPITAL_COMMUNITY)

## 2024-05-26 ENCOUNTER — Encounter (HOSPITAL_COMMUNITY)
Admission: RE | Admit: 2024-05-26 | Discharge: 2024-05-26 | Disposition: A | Discharge status: Home / Self Care | Source: Ambulatory Visit | Attending: Orthopedic Surgery | Admitting: Orthopedic Surgery

## 2024-05-26 ENCOUNTER — Ambulatory Visit (HOSPITAL_COMMUNITY)
Admission: RE | Admit: 2024-05-26 | Discharge: 2024-05-26 | Disposition: A | Discharge status: Home / Self Care | Source: Ambulatory Visit | Attending: Orthopedic Surgery | Admitting: Orthopedic Surgery

## 2024-05-26 DIAGNOSIS — T84030A Mechanical loosening of internal right hip prosthetic joint, initial encounter: Secondary | ICD-10-CM

## 2024-05-26 DIAGNOSIS — Z96643 Presence of artificial hip joint, bilateral: Secondary | ICD-10-CM | POA: Diagnosis not present

## 2024-05-26 DIAGNOSIS — M25551 Pain in right hip: Secondary | ICD-10-CM | POA: Diagnosis not present

## 2024-05-26 MED ORDER — TECHNETIUM TC 99M MEDRONATE IV KIT
20.0000 | PACK | Freq: Once | INTRAVENOUS | Status: AC | PRN
  Administered 2024-05-26: 20 via INTRAVENOUS

## 2024-06-02 DIAGNOSIS — F330 Major depressive disorder, recurrent, mild: Secondary | ICD-10-CM | POA: Diagnosis not present

## 2024-06-02 DIAGNOSIS — N182 Chronic kidney disease, stage 2 (mild): Secondary | ICD-10-CM | POA: Diagnosis not present

## 2024-06-02 DIAGNOSIS — Z0000 Encounter for general adult medical examination without abnormal findings: Secondary | ICD-10-CM | POA: Diagnosis not present

## 2024-06-02 DIAGNOSIS — E7849 Other hyperlipidemia: Secondary | ICD-10-CM | POA: Diagnosis not present

## 2024-06-02 DIAGNOSIS — K219 Gastro-esophageal reflux disease without esophagitis: Secondary | ICD-10-CM | POA: Diagnosis not present

## 2024-06-02 DIAGNOSIS — R1310 Dysphagia, unspecified: Secondary | ICD-10-CM | POA: Diagnosis not present

## 2024-06-02 DIAGNOSIS — F410 Panic disorder [episodic paroxysmal anxiety] without agoraphobia: Secondary | ICD-10-CM | POA: Diagnosis not present

## 2024-06-02 DIAGNOSIS — I10 Essential (primary) hypertension: Secondary | ICD-10-CM | POA: Diagnosis not present

## 2024-06-02 DIAGNOSIS — Z6831 Body mass index (BMI) 31.0-31.9, adult: Secondary | ICD-10-CM | POA: Diagnosis not present

## 2024-06-09 DIAGNOSIS — M810 Age-related osteoporosis without current pathological fracture: Secondary | ICD-10-CM | POA: Diagnosis not present

## 2024-06-09 DIAGNOSIS — Z780 Asymptomatic menopausal state: Secondary | ICD-10-CM | POA: Diagnosis not present

## 2024-06-15 ENCOUNTER — Other Ambulatory Visit (HOSPITAL_COMMUNITY): Payer: Self-pay

## 2024-06-15 ENCOUNTER — Other Ambulatory Visit: Payer: Self-pay

## 2024-06-15 NOTE — Progress Notes (Signed)
 Specialty Pharmacy Refill Coordination Note  Laura Jones is a 53 y.o. female assessed today regarding refills of clinic administered specialty medication(s) Cabotegravir  & Rilpivirine  (CABENUVA )   Clinic requested Courier to Provider Office   Delivery date: 06/22/24   Verified address: [address] [place] [address]   Medication will be filled on 06/19/24.

## 2024-06-22 ENCOUNTER — Telehealth: Payer: Self-pay

## 2024-06-22 NOTE — Telephone Encounter (Signed)
 [facility] Patient Advocate Encounter  Patient's medications Cabenuva  have been couriered to [facility] from [facility] and will be administered at the patients appointment on 06/30/24.  Arland Hutchinson, CPhT Specialty Pharmacy Patient [facility] for Infectious Disease Phone: [phone] Fax:  [phone]

## 2024-06-29 NOTE — Progress Notes (Deleted)
 HPI: Laura Jones is a 53 y.o. female who presents to the [facility] pharmacy clinic for Cabenuva  administration.  Patient Active Problem List   Diagnosis Date Noted   Dysphagia 07/25/2023   Elevated serum creatinine 05/30/2020   Anemia 05/30/2020   Liver fibrosis 05/27/2019   Closed bimalleolar fracture of left ankle 02/03/2019   VIN III (vulvar intraepithelial neoplasia III) 10/14/2018   OA (osteoarthritis) of hip 06/18/2018   Hypercholesteremia 12/08/2017   History of total right hip replacement 09/18/2017   Hepatitis C virus infection cured after antiviral drug therapy 07/30/2017   Acid reflux 07/30/2017   HIV (human immunodeficiency virus infection) (HCC) 07/29/2017   Cystocele, lateral 07/25/2016   Avascular necrosis of bone of hip, left (HCC) 09/13/2015   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   BUPROPION  (ZYBAN ) 150 MG 12 HR TABLET    Take 150 mg by mouth 2 (two) times daily.   BUSPIRONE  (BUSPAR ) 15 MG TABLET    Take 15 mg by mouth 2 (two) times daily.   CABOTEGRAVIR  & RILPIVIRINE  ER (CABENUVA ) 600 & 900 MG/3ML INJECTION    Inject 1 kit into the muscle every 2 (two) months.   CHOLECALCIFEROL (VITAMIN D) 50 MCG (2000 UT) TABLET    Take 4,000 Units by mouth daily.    DICYCLOMINE  (BENTYL ) 10 MG CAPSULE    Take 10 mg by mouth 2 (two) times daily.    DIPHENHYDRAMINE  (BENADRYL  ALLERGY) 25 MG CAPSULE    Take 25 mg by mouth every 6 (six) hours as needed.   DIPHENHYDRAMINE -ACETAMINOPHEN  (TYLENOL  PM) 25-500 MG TABS TABLET    Take 1 tablet by mouth daily as needed.   FAMOTIDINE  (PEPCID ) 40 MG TABLET    Take 40 mg by mouth at bedtime.   FLUOXETINE (PROZAC) 40 MG CAPSULE    Take 40 mg by  mouth daily.   IBANDRONATE  (BONIVA ) 150 MG TABLET    Take 150 mg by mouth every 30 (thirty) days.   LOPERAMIDE  (IMODIUM  A-D) 2 MG TABLET    Take 2 mg by mouth as needed for diarrhea or loose stools.   METOPROLOL  TARTRATE (LOPRESSOR ) 25 MG TABLET    Take 25 mg by mouth 2 (two) times daily.   OLMESARTAN (BENICAR) 20 MG TABLET    Take 20 mg by mouth daily.   OMEGA-3 FATTY ACIDS (FISH OIL) 1200 MG CAPS    Take by mouth. Takes one bid   OMEPRAZOLE  (PRILOSEC) 40 MG CAPSULE    TAKE 1 CAPSULE BY MOUTH TWICE A DAY   ONDANSETRON  (ZOFRAN -ODT) 4 MG DISINTEGRATING TABLET    Take 1 tablet (4 mg total) by mouth every 8 (eight) hours as needed for nausea or vomiting.   SUCRALFATE  (CARAFATE ) 1 GM/10ML SUSPENSION    TAKE 10 MLS (1 G TOTAL) BY MOUTH 4 (FOUR) TIMES DAILY - WITH MEALS AND AT BEDTIME.   TIZANIDINE  (ZANAFLEX ) 4 MG TABLET    Take 4 mg by mouth at bedtime. As needed   TRAZODONE  (DESYREL ) 100 MG TABLET    Take 100-200 mg by mouth at bedtime.  Modified Medications   No medications on file  Discontinued Medications   No medications on file  Allergies: Allergies  Allergen Reactions   Levaquin [Levofloxacin] Rash    Difficulty breathing, mouth swelling/rash   Compazine [Prochlorperazine Edisylate]     Muscle twitching   Droperidol     uncontrolled muscle movements    Imitrex [Sumatriptan]     Difficulty breathing, chest pain; All triptans   Promethazine     Reglan [Metoclopramide]     Uncontrolled twitching   Sulfa Antibiotics Hives    Hives    Tagamet (Premixed [Cimetidine]    Thorazine [Chlorpromazine]     Uncontrolled muscle twitching    Labs: Lab Results  Component Value Date   HIV1RNAQUANT NOT DETECTED 04/29/2024   HIV1RNAQUANT Not Detected 11/05/2023   HIV1RNAQUANT Not Detected 05/02/2023   CD4TABS 663 04/29/2024   CD4TABS 578 11/05/2023   CD4TABS 470 05/02/2023    RPR and STI Lab Results  Component Value Date   LABRPR NON-REACTIVE 04/29/2024   LABRPR NON-REACTIVE  05/10/2020    STI Results GC CT  05/10/2020 11:08 AM Negative  Negative     Hepatitis B Lab Results  Component Value Date   HEPBSAB REACTIVE (A) 03/12/2023   HEPBSAG NON-REACTIVE 07/29/2017   Hepatitis C No results found for: HEPCAB, HCVRNAPCRQN Hepatitis A Lab Results  Component Value Date   HAV REACTIVE (A) 07/29/2017   Lipids: Lab Results  Component Value Date   CHOL 185 05/02/2023   TRIG 336 (H) 05/02/2023   HDL 45 (L) 05/02/2023   CHOLHDL 4.1 05/02/2023   VLDL 65 (H) 07/29/2017   LDLCALC 95 05/02/2023    TARGET DATE: ***  Assessment: *** presents today for *** maintenance Cabenuva  injections. Past injections were tolerated well without issues. Last HIV RNA was *** in ***. Doing well with no issues today.  Administered cabotegravir  600mg /67mL in left upper outer quadrant of the gluteal muscle. Administered rilpivirine  900 mg/3mL in the right upper outer quadrant of the gluteal muscle. No issues with injections. *** will follow up in 2 months for next set of injections.  Plan: - Cabenuva  injections administered - Next injections scheduled for *** - Call with any issues or questions  Braylen Staller L. Kaylon Hitz, PharmD, BCIDP, AAHIVP, CPP Clinical Pharmacist Practitioner - Infectious Diseases Clinical Pharmacist Lead - Specialty Pharmacy [facility] for Infectious Disease 06/29/2024, 1:40 PM

## 2024-06-30 ENCOUNTER — Ambulatory Visit: Payer: Self-pay | Admitting: Pharmacist

## 2024-07-02 ENCOUNTER — Ambulatory Visit: Admitting: Pharmacist

## 2024-07-02 ENCOUNTER — Other Ambulatory Visit: Payer: Self-pay

## 2024-07-02 DIAGNOSIS — Z21 Asymptomatic human immunodeficiency virus [HIV] infection status: Secondary | ICD-10-CM

## 2024-07-02 DIAGNOSIS — M79651 Pain in right thigh: Secondary | ICD-10-CM | POA: Diagnosis not present

## 2024-07-02 DIAGNOSIS — M25551 Pain in right hip: Secondary | ICD-10-CM | POA: Diagnosis not present

## 2024-07-02 MED ORDER — CABOTEGRAVIR & RILPIVIRINE ER 600 & 900 MG/3ML IM SUER
1.0000 | Freq: Once | INTRAMUSCULAR | Status: AC
  Administered 2024-07-02: 1 via INTRAMUSCULAR

## 2024-07-02 NOTE — Progress Notes (Signed)
 HPI: Laura Jones is a 53 y.o. female who presents to the [facility] pharmacy clinic for Cabenuva  administration.  Patient Active Problem List   Diagnosis Date Noted   Dysphagia 07/25/2023   Elevated serum creatinine 05/30/2020   Anemia 05/30/2020   Liver fibrosis 05/27/2019   Closed bimalleolar fracture of left ankle 02/03/2019   VIN III (vulvar intraepithelial neoplasia III) 10/14/2018   OA (osteoarthritis) of hip 06/18/2018   Hypercholesteremia 12/08/2017   History of total right hip replacement 09/18/2017   Hepatitis C virus infection cured after antiviral drug therapy 07/30/2017   Acid reflux 07/30/2017   HIV (human immunodeficiency virus infection) (HCC) 07/29/2017   Cystocele, lateral 07/25/2016   Avascular necrosis of bone of hip, left (HCC) 09/13/2015   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   BUPROPION  (ZYBAN ) 150 MG 12 HR TABLET    Take 150 mg by mouth 2 (two) times daily.   BUSPIRONE  (BUSPAR ) 15 MG TABLET    Take 15 mg by mouth 2 (two) times daily.   CABOTEGRAVIR  & RILPIVIRINE  ER (CABENUVA ) 600 & 900 MG/3ML INJECTION    Inject 1 kit into the muscle every 2 (two) months.   CHOLECALCIFEROL (VITAMIN D) 50 MCG (2000 UT) TABLET    Take 4,000 Units by mouth daily.    DICYCLOMINE  (BENTYL ) 10 MG CAPSULE    Take 10 mg by mouth 2 (two) times daily.    DIPHENHYDRAMINE  (BENADRYL  ALLERGY) 25 MG CAPSULE    Take 25 mg by mouth every 6 (six) hours as needed.   DIPHENHYDRAMINE -ACETAMINOPHEN  (TYLENOL  PM) 25-500 MG TABS TABLET    Take 1 tablet by mouth daily as needed.   FAMOTIDINE  (PEPCID ) 40 MG TABLET    Take 40 mg by mouth at bedtime.   FLUOXETINE (PROZAC) 40 MG CAPSULE    Take 40 mg by  mouth daily.   IBANDRONATE  (BONIVA ) 150 MG TABLET    Take 150 mg by mouth every 30 (thirty) days.   LOPERAMIDE  (IMODIUM  A-D) 2 MG TABLET    Take 2 mg by mouth as needed for diarrhea or loose stools.   METOPROLOL  TARTRATE (LOPRESSOR ) 25 MG TABLET    Take 25 mg by mouth 2 (two) times daily.   OLMESARTAN (BENICAR) 20 MG TABLET    Take 20 mg by mouth daily.   OMEGA-3 FATTY ACIDS (FISH OIL) 1200 MG CAPS    Take by mouth. Takes one bid   OMEPRAZOLE  (PRILOSEC) 40 MG CAPSULE    TAKE 1 CAPSULE BY MOUTH TWICE A DAY   ONDANSETRON  (ZOFRAN -ODT) 4 MG DISINTEGRATING TABLET    Take 1 tablet (4 mg total) by mouth every 8 (eight) hours as needed for nausea or vomiting.   SUCRALFATE  (CARAFATE ) 1 GM/10ML SUSPENSION    TAKE 10 MLS (1 G TOTAL) BY MOUTH 4 (FOUR) TIMES DAILY - WITH MEALS AND AT BEDTIME.   TIZANIDINE  (ZANAFLEX ) 4 MG TABLET    Take 4 mg by mouth at bedtime. As needed   TRAZODONE  (DESYREL ) 100 MG TABLET    Take 100-200 mg by mouth at bedtime.  Modified Medications   No medications on file  Discontinued Medications   No medications on file  Allergies: Allergies  Allergen Reactions   Levaquin [Levofloxacin] Rash    Difficulty breathing, mouth swelling/rash   Compazine [Prochlorperazine Edisylate]     Muscle twitching   Droperidol     uncontrolled muscle movements    Imitrex [Sumatriptan]     Difficulty breathing, chest pain; All triptans   Promethazine     Reglan [Metoclopramide]     Uncontrolled twitching   Sulfa Antibiotics Hives    Hives    Tagamet (Premixed [Cimetidine]    Thorazine [Chlorpromazine]     Uncontrolled muscle twitching    Labs: Lab Results  Component Value Date   HIV1RNAQUANT NOT DETECTED 04/29/2024   HIV1RNAQUANT Not Detected 11/05/2023   HIV1RNAQUANT Not Detected 05/02/2023   CD4TABS 663 04/29/2024   CD4TABS 578 11/05/2023   CD4TABS 470 05/02/2023    RPR and STI Lab Results  Component Value Date   LABRPR NON-REACTIVE 04/29/2024   LABRPR NON-REACTIVE  05/10/2020    STI Results GC CT  05/10/2020 11:08 AM Negative  Negative     Hepatitis B Lab Results  Component Value Date   HEPBSAB REACTIVE (A) 03/12/2023   HEPBSAG NON-REACTIVE 07/29/2017   Hepatitis C No results found for: HEPCAB, HCVRNAPCRQN Hepatitis A Lab Results  Component Value Date   HAV REACTIVE (A) 07/29/2017   Lipids: Lab Results  Component Value Date   CHOL 185 05/02/2023   TRIG 336 (H) 05/02/2023   HDL 45 (L) 05/02/2023   CHOLHDL 4.1 05/02/2023   VLDL 65 (H) 07/29/2017   LDLCALC 95 05/02/2023    TARGET DATE: 14th  Assessment: Laura Jones presents today for her maintenance Cabenuva  injections. Past injections were tolerated well without issues. Last HIV RNA was assessed in May. Due for lipid screening at next blood draw. Doing well with no issues today.  Administered cabotegravir  600mg /74mL in left upper outer quadrant of the gluteal muscle. Administered rilpivirine  900 mg/3mL in the right upper outer quadrant of the gluteal muscle. No issues with injections. She will follow up in 2 months for next set of injections.  Currently up-to-date on vaccines; eligible for PCV20 at the end of this year.   Plan: - Cabenuva  injections administered - Next injections scheduled for 9/8 with Cassie and 11/10 with Corean   - Call with any issues or questions  Alan Geralds, PharmD, CPP, BCIDP, AAHIVP Clinical Pharmacist Practitioner Infectious Diseases Clinical Pharmacist [facility]  07/02/2024, 3:29 PM

## 2024-07-17 ENCOUNTER — Other Ambulatory Visit (INDEPENDENT_AMBULATORY_CARE_PROVIDER_SITE_OTHER): Payer: Self-pay | Admitting: Gastroenterology

## 2024-07-24 DIAGNOSIS — M25551 Pain in right hip: Secondary | ICD-10-CM | POA: Diagnosis not present

## 2024-07-30 ENCOUNTER — Encounter: Payer: Self-pay | Admitting: Pharmacist

## 2024-08-20 ENCOUNTER — Other Ambulatory Visit: Payer: Self-pay

## 2024-08-20 ENCOUNTER — Other Ambulatory Visit (HOSPITAL_COMMUNITY): Payer: Self-pay

## 2024-08-20 NOTE — Progress Notes (Signed)
 Specialty Pharmacy Refill Coordination Note  Laura Jones is a 53 y.o. female assessed today regarding refills of clinic administered specialty medication(s) Cabotegravir  & Rilpivirine  (CABENUVA )   Clinic requested Courier to Provider Office   Delivery date: 08/27/24   Verified address: [address] [employer] [address] [place] [address]   Medication will be filled on 08/26/24.

## 2024-08-26 ENCOUNTER — Other Ambulatory Visit: Payer: Self-pay

## 2024-08-27 ENCOUNTER — Telehealth: Payer: Self-pay

## 2024-08-27 NOTE — Telephone Encounter (Signed)
 [facility] Patient Advocate Encounter  Patient's medications CABENUVA  have been couriered to [facility] from [facility] and will be administered at the patients appointment on 08/31/24.  Charmaine Sharps, CPhT Specialty Pharmacy Patient [facility] for Infectious Disease Phone: [phone] Fax:  [phone]

## 2024-08-30 NOTE — Progress Notes (Unsigned)
 HPI: Laura Jones is a 53 y.o. female who presents to the [facility] pharmacy clinic for Cabenuva  administration.  Patient Active Problem List   Diagnosis Date Noted   Dysphagia 07/25/2023   Elevated serum creatinine 05/30/2020   Anemia 05/30/2020   Liver fibrosis 05/27/2019   Closed bimalleolar fracture of left ankle 02/03/2019   VIN III (vulvar intraepithelial neoplasia III) 10/14/2018   OA (osteoarthritis) of hip 06/18/2018   Hypercholesteremia 12/08/2017   History of total right hip replacement 09/18/2017   Hepatitis C virus infection cured after antiviral drug therapy 07/30/2017   Acid reflux 07/30/2017   HIV (human immunodeficiency virus infection) (HCC) 07/29/2017   Cystocele, lateral 07/25/2016   Avascular necrosis of bone of hip, left (HCC) 09/13/2015   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   BUPROPION  (ZYBAN ) 150 MG 12 HR TABLET    Take 150 mg by mouth 2 (two) times daily.   BUSPIRONE  (BUSPAR ) 15 MG TABLET    Take 15 mg by mouth 2 (two) times daily.   CABOTEGRAVIR  & RILPIVIRINE  ER (CABENUVA ) 600 & 900 MG/3ML INJECTION    Inject 1 kit into the muscle every 2 (two) months.   CHOLECALCIFEROL (VITAMIN D) 50 MCG (2000 UT) TABLET    Take 4,000 Units by mouth daily.    DICYCLOMINE  (BENTYL ) 10 MG CAPSULE    Take 10 mg by mouth 2 (two) times daily.    DIPHENHYDRAMINE  (BENADRYL  ALLERGY) 25 MG CAPSULE    Take 25 mg by mouth every 6 (six) hours as needed.   DIPHENHYDRAMINE -ACETAMINOPHEN  (TYLENOL  PM) 25-500 MG TABS TABLET    Take 1 tablet by mouth daily as needed.   FAMOTIDINE  (PEPCID ) 40 MG TABLET    Take 40 mg by mouth at bedtime.   FLUOXETINE (PROZAC) 40 MG CAPSULE    Take 40 mg by  mouth daily.   IBANDRONATE  (BONIVA ) 150 MG TABLET    Take 150 mg by mouth every 30 (thirty) days.   LOPERAMIDE  (IMODIUM  A-D) 2 MG TABLET    Take 2 mg by mouth as needed for diarrhea or loose stools.   METOPROLOL  TARTRATE (LOPRESSOR ) 25 MG TABLET    Take 25 mg by mouth 2 (two) times daily.   OLMESARTAN (BENICAR) 20 MG TABLET    Take 20 mg by mouth daily.   OMEGA-3 FATTY ACIDS (FISH OIL) 1200 MG CAPS    Take by mouth. Takes one bid   OMEPRAZOLE  (PRILOSEC) 40 MG CAPSULE    TAKE 1 CAPSULE BY MOUTH TWICE A DAY   ONDANSETRON  (ZOFRAN -ODT) 4 MG DISINTEGRATING TABLET    Take 1 tablet (4 mg total) by mouth every 8 (eight) hours as needed for nausea or vomiting.   SUCRALFATE  (CARAFATE ) 1 GM/10ML SUSPENSION    TAKE 10 MLS (1 G TOTAL) BY MOUTH 4 (FOUR) TIMES DAILY - WITH MEALS AND AT BEDTIME.   TIZANIDINE  (ZANAFLEX ) 4 MG TABLET    Take 4 mg by mouth at bedtime. As needed   TRAZODONE  (DESYREL ) 100 MG TABLET    Take 100-200 mg by mouth at bedtime.  Modified Medications   No medications on file  Discontinued Medications   No medications on file  Allergies: Allergies  Allergen Reactions   Levaquin [Levofloxacin] Rash    Difficulty breathing, mouth swelling/rash   Compazine [Prochlorperazine Edisylate]     Muscle twitching   Droperidol     uncontrolled muscle movements    Imitrex [Sumatriptan]     Difficulty breathing, chest pain; All triptans   Promethazine     Reglan [Metoclopramide]     Uncontrolled twitching   Sulfa Antibiotics Hives    Hives    Tagamet (Premixed [Cimetidine]    Thorazine [Chlorpromazine]     Uncontrolled muscle twitching    Labs: Lab Results  Component Value Date   HIV1RNAQUANT NOT DETECTED 04/29/2024   HIV1RNAQUANT Not Detected 11/05/2023   HIV1RNAQUANT Not Detected 05/02/2023   CD4TABS 663 04/29/2024   CD4TABS 578 11/05/2023   CD4TABS 470 05/02/2023    RPR and STI Lab Results  Component Value Date   LABRPR NON-REACTIVE 04/29/2024   LABRPR NON-REACTIVE  05/10/2020    STI Results GC CT  05/10/2020 11:08 AM Negative  Negative     Hepatitis B Lab Results  Component Value Date   HEPBSAB REACTIVE (A) 03/12/2023   HEPBSAG NON-REACTIVE 07/29/2017   Hepatitis C No results found for: HEPCAB, HCVRNAPCRQN Hepatitis A Lab Results  Component Value Date   HAV REACTIVE (A) 07/29/2017   Lipids: Lab Results  Component Value Date   CHOL 185 05/02/2023   TRIG 336 (H) 05/02/2023   HDL 45 (L) 05/02/2023   CHOLHDL 4.1 05/02/2023   VLDL 65 (H) 07/29/2017   LDLCALC 95 05/02/2023    TARGET DATE: the 14th of each month  Assessment: Laura Jones presents today for her maintenance Cabenuva  injections. Past injections were tolerated well without issues. Last HIV RNA was undetectable in May. Doing well with no issues today.  Administered cabotegravir  600mg /69mL in left upper outer quadrant of the gluteal muscle. Administered rilpivirine  900 mg/3mL in the right upper outer quadrant of the gluteal muscle. No issues with injections. She will follow up in 2 months for next set of injections.  Patient is eligible for the flu vaccine today. She will be eligible for PCV20 in December.  Plan: - Cabenuva  injections administered - Administered 2025-2026 flu shot in the right deltoid - Will collect HIV RNA and lipid panel today - Next injections scheduled for 11/02/24 with Corean and 12/30/24 with Cassie - Call with any issues or questions  Izetta Carl, PharmD PGY1 Pharmacy Resident [facility]  08/30/2024 5:53 PM

## 2024-08-31 ENCOUNTER — Ambulatory Visit: Admitting: Pharmacist

## 2024-08-31 ENCOUNTER — Ambulatory Visit (INDEPENDENT_AMBULATORY_CARE_PROVIDER_SITE_OTHER): Admitting: Pharmacist

## 2024-08-31 ENCOUNTER — Other Ambulatory Visit: Payer: Self-pay

## 2024-08-31 DIAGNOSIS — Z23 Encounter for immunization: Secondary | ICD-10-CM | POA: Diagnosis not present

## 2024-08-31 DIAGNOSIS — B20 Human immunodeficiency virus [HIV] disease: Secondary | ICD-10-CM

## 2024-08-31 DIAGNOSIS — Z21 Asymptomatic human immunodeficiency virus [HIV] infection status: Secondary | ICD-10-CM

## 2024-08-31 DIAGNOSIS — Z79899 Other long term (current) drug therapy: Secondary | ICD-10-CM | POA: Diagnosis not present

## 2024-08-31 MED ORDER — CABOTEGRAVIR & RILPIVIRINE ER 600 & 900 MG/3ML IM SUER
1.0000 | Freq: Once | INTRAMUSCULAR | Status: AC
  Administered 2024-08-31: 1 via INTRAMUSCULAR

## 2024-09-02 DIAGNOSIS — N182 Chronic kidney disease, stage 2 (mild): Secondary | ICD-10-CM | POA: Diagnosis not present

## 2024-09-02 DIAGNOSIS — F330 Major depressive disorder, recurrent, mild: Secondary | ICD-10-CM | POA: Diagnosis not present

## 2024-09-02 DIAGNOSIS — K219 Gastro-esophageal reflux disease without esophagitis: Secondary | ICD-10-CM | POA: Diagnosis not present

## 2024-09-02 DIAGNOSIS — F410 Panic disorder [episodic paroxysmal anxiety] without agoraphobia: Secondary | ICD-10-CM | POA: Diagnosis not present

## 2024-09-02 DIAGNOSIS — E7849 Other hyperlipidemia: Secondary | ICD-10-CM | POA: Diagnosis not present

## 2024-09-02 DIAGNOSIS — Z6831 Body mass index (BMI) 31.0-31.9, adult: Secondary | ICD-10-CM | POA: Diagnosis not present

## 2024-09-02 DIAGNOSIS — I10 Essential (primary) hypertension: Secondary | ICD-10-CM | POA: Diagnosis not present

## 2024-09-02 DIAGNOSIS — R1310 Dysphagia, unspecified: Secondary | ICD-10-CM | POA: Diagnosis not present

## 2024-09-02 LAB — LIPID PANEL
Cholesterol: 163 mg/dL (ref <200)
HDL: 48 mg/dL — ABNORMAL LOW (ref >=50)
LDL Cholesterol (Calc): 82 mg/dL
Non-HDL Cholesterol (Calc): 115 mg/dL (ref <130)
Total CHOL/HDL Ratio: 3.4 (calc) (ref <5.0)
Triglycerides: 235 mg/dL — ABNORMAL HIGH (ref <150)

## 2024-09-02 LAB — HIV-1 RNA QUANT-NO REFLEX-BLD
HIV 1 RNA Quant: NOT DETECTED {copies}/mL
HIV-1 RNA Quant, Log: NOT DETECTED {Log_copies}/mL

## 2024-09-23 ENCOUNTER — Other Ambulatory Visit: Payer: Self-pay | Admitting: Medical Genetics

## 2024-09-30 ENCOUNTER — Encounter (INDEPENDENT_AMBULATORY_CARE_PROVIDER_SITE_OTHER): Payer: Self-pay | Admitting: *Deleted

## 2024-10-02 ENCOUNTER — Encounter (INDEPENDENT_AMBULATORY_CARE_PROVIDER_SITE_OTHER): Payer: Self-pay | Admitting: Gastroenterology

## 2024-10-07 ENCOUNTER — Encounter (INDEPENDENT_AMBULATORY_CARE_PROVIDER_SITE_OTHER): Payer: Self-pay | Admitting: Gastroenterology

## 2024-10-07 ENCOUNTER — Other Ambulatory Visit (HOSPITAL_COMMUNITY)
Admission: RE | Admit: 2024-10-07 | Discharge: 2024-10-07 | Disposition: A | Discharge status: Home / Self Care | Payer: Self-pay | Source: Ambulatory Visit | Attending: Oncology | Admitting: Oncology

## 2024-10-14 ENCOUNTER — Telehealth (INDEPENDENT_AMBULATORY_CARE_PROVIDER_SITE_OTHER): Payer: Self-pay

## 2024-10-14 ENCOUNTER — Other Ambulatory Visit (INDEPENDENT_AMBULATORY_CARE_PROVIDER_SITE_OTHER): Payer: Self-pay

## 2024-10-14 DIAGNOSIS — K7400 Hepatic fibrosis, unspecified: Secondary | ICD-10-CM

## 2024-10-14 NOTE — Telephone Encounter (Signed)
 ATC patient and give 6 month ultrasound appt for 11/02/2024 at 9:30 (NPO midnight). Patient did not answer, LVM for call back.

## 2024-10-15 NOTE — Telephone Encounter (Signed)
 Patient called back and lvm to schedule her 6 month ultrasound. I LVM for pt and stated that I have scheduled the ultrasound and gave appt details.

## 2024-10-16 LAB — GENECONNECT MOLECULAR SCREEN: Genetic Analysis Overall Interpretation: NEGATIVE

## 2024-10-17 ENCOUNTER — Other Ambulatory Visit (INDEPENDENT_AMBULATORY_CARE_PROVIDER_SITE_OTHER): Payer: Self-pay | Admitting: Gastroenterology

## 2024-10-19 ENCOUNTER — Other Ambulatory Visit (INDEPENDENT_AMBULATORY_CARE_PROVIDER_SITE_OTHER): Payer: Self-pay | Admitting: Gastroenterology

## 2024-10-21 ENCOUNTER — Other Ambulatory Visit (HOSPITAL_COMMUNITY): Payer: Self-pay

## 2024-10-21 ENCOUNTER — Other Ambulatory Visit: Payer: Self-pay

## 2024-10-21 NOTE — Progress Notes (Signed)
 Specialty Pharmacy Refill Coordination Note  Laura Jones is a 53 y.o. female assessed today regarding refills of clinic administered specialty medication(s) Cabotegravir  & Rilpivirine  (CABENUVA )   Clinic requested Courier to Provider Office   Delivery date: 10/29/24   Verified address: [address] [place] [address]   Medication will be filled on 10/28/24.

## 2024-10-22 DIAGNOSIS — M25551 Pain in right hip: Secondary | ICD-10-CM | POA: Diagnosis not present

## 2024-10-22 DIAGNOSIS — M48061 Spinal stenosis, lumbar region without neurogenic claudication: Secondary | ICD-10-CM | POA: Diagnosis not present

## 2024-10-22 DIAGNOSIS — M25552 Pain in left hip: Secondary | ICD-10-CM | POA: Diagnosis not present

## 2024-10-28 ENCOUNTER — Other Ambulatory Visit: Payer: Self-pay

## 2024-10-28 DIAGNOSIS — M545 Low back pain, unspecified: Secondary | ICD-10-CM | POA: Diagnosis not present

## 2024-10-29 ENCOUNTER — Telehealth: Payer: Self-pay

## 2024-10-29 NOTE — Telephone Encounter (Signed)
 [facility] Patient Advocate Encounter  Patient's medications CABENUVA  have been couriered to [facility] from [facility] and will be administered at the patients appointment on 11/04/24.  Charmaine Sharps, CPhT Specialty Pharmacy Patient [facility] for Infectious Disease Phone: [phone] Fax:  [phone]

## 2024-11-02 ENCOUNTER — Encounter: Payer: Self-pay | Admitting: Infectious Diseases

## 2024-11-02 ENCOUNTER — Ambulatory Visit (HOSPITAL_COMMUNITY)

## 2024-11-05 ENCOUNTER — Encounter: Payer: Self-pay | Admitting: Infectious Diseases

## 2024-11-05 ENCOUNTER — Ambulatory Visit (INDEPENDENT_AMBULATORY_CARE_PROVIDER_SITE_OTHER): Admitting: Infectious Diseases

## 2024-11-05 ENCOUNTER — Other Ambulatory Visit: Payer: Self-pay

## 2024-11-05 VITALS — BP 168/96 | HR 71 | Temp 97.3°F | Ht 60.0 in | Wt 170.0 lb

## 2024-11-05 DIAGNOSIS — Z79899 Other long term (current) drug therapy: Secondary | ICD-10-CM | POA: Diagnosis not present

## 2024-11-05 DIAGNOSIS — Z23 Encounter for immunization: Secondary | ICD-10-CM

## 2024-11-05 DIAGNOSIS — Z8619 Personal history of other infectious and parasitic diseases: Secondary | ICD-10-CM

## 2024-11-05 DIAGNOSIS — B20 Human immunodeficiency virus [HIV] disease: Secondary | ICD-10-CM

## 2024-11-05 MED ORDER — CABOTEGRAVIR & RILPIVIRINE ER 600 & 900 MG/3ML IM SUER
1.0000 | Freq: Once | INTRAMUSCULAR | Status: AC
  Administered 2024-11-05: 1 via INTRAMUSCULAR

## 2024-11-05 NOTE — Patient Instructions (Addendum)
 Next appointment scheduled for you on 12/30/2024  VISIT SUMMARY:  Today, you came in for your routine follow-up care and to receive your Cabenuva  injection. You mentioned that you are doing well with no new concerns regarding your HIV treatment. We also discussed your recent bone scan and the potential need for hip and bone replacement due to pain and issues with your femur bone. Additionally, you received your COVID-19 booster shot during this visit.  YOUR PLAN:  -HUMAN IMMUNODEFICIENCY VIRUS (HIV) DISEASE: HIV is a virus that attacks the body's immune system. Your HIV is well-controlled with Cabenuva  injections, which you receive every two months. We will continue with this treatment and have ordered viral load and CD4 tests to monitor your condition.  -IMMUNIZATION ADMINISTRATION (COVID-19 BOOSTER): You received your COVID-19 booster shot today to help protect you against the virus. This booster is important to maintain your immunity.  INSTRUCTIONS:  Please continue with your Cabenuva  injections every two months. We have ordered viral load and CD4 tests, so please ensure you complete these tests as instructed. If you experience any new symptoms or have any concerns, please contact our office.

## 2024-11-05 NOTE — Progress Notes (Signed)
 Patient Name: Laura Jones  Date of Birth: [birth year] MRN: [id number]  PCP: Orpha Yancey LABOR, MD    SUBJECTIVE:  Brief Narrative:   Laura Jones is a 53 y.o. female with HIV disease, dx a long time ago. Relocated from [place] in 2018 where she was in care with [facility].  History of Hep C, cured (s/p treatment x 2, HCV Quant < 15, 07/29/2017).   +Cirrhosis per notes from previous liver biopsies  S/P hysterectomy Hep B sAg (-), Hep B sAb (-) following several attempts to vaccinate.  History of OIs: none known.  HIV Risk: heterosexual contact  Previous Regimens:  Prezista + Norvir + Truvada  Genvoya  >> suppressed  Biktarvy  09/2018 >> switched d/t DDI with medications Cabenuva  injections, 11/2021   Genotype:  [id number] - NNRTI resistance per chart records    Subjective  Discussed the use of AI scribe software for clinical note transcription with the patient, who gave verbal consent to proceed.  History of Present Illness   Laura Jones is a 53 year old female with HIV who presents for routine follow-up care and Cabenuva  injection administration.  She was last seen in May of this year and reported doing well with no concerns at that time. She continues to receive Cabenuva  injections every two months, which she tolerates well and finds effective. No recent hospital stays.  She mentions a potential need for hip and bone replacement due to pain and issues with the femur bone, identified through a bone scan. The scan showed a shift in the femur component, and an aspiration ruled out infection. Her left hip has started to hurt, with the socket partially out of the bone.  Her family history includes her mother, who had a significant health decline last year due to COVID-19, resulting in pneumonia and blood clots, followed by a fall that caused spinal and pelvic fractures. Her mother was bedridden for a year but has recently improved with physical therapy and is now able  to walk with assistance. Her sister, who has a history of drug use, was previously helping with her mother's care but is currently unavailable.  Socially, she has a son in [place] who recently returned from [place]. She also raised her niece and nephew. She has been in therapy for over twenty years and has also ensured her son received therapy during challenging times in his life.          04/29/2024    2:30 PM 11/05/2023   10:38 AM 09/10/2022   11:39 AM  Depression screen PHQ 2/9  Decreased Interest 1 0 0  Down, Depressed, Hopeless 1 0 0  PHQ - 2 Score 2 0 0  Altered sleeping 1    Tired, decreased energy 1    Change in appetite 1    Feeling bad or failure about yourself  1    Trouble concentrating 1    Moving slowly or fidgety/restless 1    Suicidal thoughts 0    PHQ-9 Score 8     Difficult doing work/chores Somewhat difficult       Data saved with a previous flowsheet row definition       04/29/2024    2:30 PM  GAD 7 : Generalized Anxiety Score  Nervous, Anxious, on Edge 2  Control/stop worrying 2  Worry too much - different things 2  Trouble relaxing 2  Restless 2  Easily annoyed or irritable 2  Afraid - awful might happen 2  Total  GAD 7 Score 14  Anxiety Difficulty Somewhat difficult      Review of Systems  Constitutional:  Negative for chills and fever.  HENT:  Negative for tinnitus.   Eyes:  Negative for photophobia.  Respiratory:  Negative for cough.   Cardiovascular:  Negative for chest pain.  Gastrointestinal:  Negative for diarrhea, nausea and vomiting.  Genitourinary:  Negative for dysuria.  Skin:  Negative for rash.       Itching as per HPI  Neurological:  Negative for headaches.    Past Medical History:  Diagnosis Date   Anemia    hx of    Anxiety    Arthritis    left knee   Avascular necrosis of bone of hip, left (HCC)    Bulging lumbar disc    Depression    Dyspnea    with excertion   GERD (gastroesophageal reflux disease)     Hepatitis    C treated 2 years ago went undetected   History of kidney stones    HIV (human immunodeficiency virus infection) (HCC) 07/29/2017   HIV infection (HCC)    Insomnia    Migraines    Nerve pain    Perimenopausal    Porphyria (HCC)    PTSD (post-traumatic stress disorder)    Spinal stenosis of lumbosacral region    Tendonitis    right arm    Trauma     Social History   Tobacco Use   Smoking status: Never    Passive exposure: Past   Smokeless tobacco: Never  Vaping Use   Vaping status: Never Used  Substance Use Topics   Alcohol use: No   Drug use: No     Allergies  Allergen Reactions   Levaquin [Levofloxacin] Rash    Difficulty breathing, mouth swelling/rash   Fish Oil Nausea And Vomiting and Nausea Only    Patient is taking fish oil now and no i  fish oils   Compazine [Prochlorperazine Edisylate]     Muscle twitching   Droperidol     uncontrolled muscle movements    Imitrex [Sumatriptan]     Difficulty breathing, chest pain; All triptans   Promethazine     Reglan [Metoclopramide]     Uncontrolled twitching   Sulfa Antibiotics Hives    Hives    Tagamet (Premixed [Cimetidine]    Thorazine [Chlorpromazine]     Uncontrolled muscle twitching    Objective   Objective:  Vitals:   11/05/24 0835 11/05/24 0902  BP: (!) 176/95 (!) 168/96  Pulse: 71   Temp: (!) 97.3 F (36.3 C)   TempSrc: Temporal   SpO2: 98%   Weight: 170 lb (77.1 kg)   Height: 5' (1.524 m)     Body mass index is 33.2 kg/m.   Physical Exam Constitutional:      Appearance: Normal appearance. She is not ill-appearing.  HENT:     Mouth/Throat:     Mouth: Mucous membranes are moist.     Pharynx: Oropharynx is clear.  Eyes:     General: No scleral icterus. Cardiovascular:     Rate and Rhythm: Normal rate and regular rhythm.  Pulmonary:     Effort: Pulmonary effort is normal.  Neurological:     Mental Status: She is oriented to person, place, and time.  Psychiatric:         Mood and Affect: Mood normal.        Thought Content: Thought content normal.     Lab Results HIV 1  RNA Quant  Date Value  08/31/2024 NOT DETECTED copies/mL  04/29/2024 NOT DETECTED copies/mL  11/05/2023 Not Detected Copies/mL   CD4 T Cell Abs (/uL)  Date Value  04/29/2024 663  11/05/2023 578  05/02/2023 470   Lab Results  Component Value Date   ALT 12 04/29/2024   AST 17 04/29/2024   ALKPHOS 95 12/19/2019   BILITOT 0.2 04/29/2024   Lab Results  Component Value Date   CREATININE 1.03 04/29/2024   CREATININE 1.00 11/05/2023   CREATININE 1.17 (H) 05/02/2023       ASSESSMENT & PLAN:    Human immunodeficiency virus (HIV) disease - HIV is well-controlled with Cabenuva  injections every two months. No new concerns or symptoms reported. - Continue Cabenuva  injections every two months. - Ordered viral load and CD4 tests.  Immunization administration (COVID-19 booster) She expressed interest in receiving the COVID-19 booster today. - Administered COVID-19 booster. - Otherwise up to date for age and health conditions     Cirrhosis history, s/p HCV cure -  U/S scheduled for 11/20 - AFP, CMP and CBC today   Recording duration: 11 minutes      Meds ordered this encounter  Medications   cabotegravir  & rilpivirine  ER (CABENUVA ) 600 & 900 MG/3ML injection 1 kit     Orders Placed This Sport And Exercise Psychologist Vaccine 60yrs & older   AFP tumor marker   COMPLETE METABOLIC PANEL WITHOUT GFR   CBC w/Diff   HIV 1 RNA quant-no reflex-bld   T-helper cells (CD4) count   Meds ordered this encounter  Medications   cabotegravir  & rilpivirine  ER (CABENUVA ) 600 & 900 MG/3ML injection 1 kit    Corean Fireman, MSN, NP-C [facility] for Infectious Disease [facility]  [email or web address] Pager: [phone] Office: [phone] [facility] Main Line: [phone]

## 2024-11-06 LAB — T-HELPER CELLS (CD4) COUNT (NOT AT ARMC)
CD4 % Helper T Cell: 32 % — ABNORMAL LOW (ref 33–65)
CD4 T Cell Abs: 980 /uL (ref 400–1790)

## 2024-11-09 LAB — COMPLETE METABOLIC PANEL WITHOUT GFR
AG Ratio: 1.5 (calc) (ref 1.0–2.5)
ALT: 12 U/L (ref 6–29)
AST: 18 U/L (ref 10–35)
Albumin: 4.6 g/dL (ref 3.6–5.1)
Alkaline phosphatase (APISO): 41 U/L (ref 37–153)
BUN/Creatinine Ratio: 8 (calc) (ref 6–22)
BUN: 9 mg/dL (ref 7–25)
CO2: 28 mmol/L (ref 20–32)
Calcium: 10.1 mg/dL (ref 8.6–10.4)
Chloride: 103 mmol/L (ref 98–110)
Creat: 1.13 mg/dL — ABNORMAL HIGH (ref 0.50–1.03)
Globulin: 3 g/dL (ref 1.9–3.7)
Glucose, Bld: 101 mg/dL — ABNORMAL HIGH (ref 65–99)
Potassium: 3.4 mmol/L — ABNORMAL LOW (ref 3.5–5.3)
Sodium: 140 mmol/L (ref 135–146)
Total Bilirubin: 0.3 mg/dL (ref 0.2–1.2)
Total Protein: 7.6 g/dL (ref 6.1–8.1)

## 2024-11-09 LAB — CBC WITH DIFFERENTIAL/PLATELET
Absolute Lymphocytes: 3245 {cells}/uL (ref 850–3900)
Absolute Monocytes: 512 {cells}/uL (ref 200–950)
Basophils Absolute: 38 {cells}/uL (ref 0–200)
Basophils Relative: 0.6 %
Eosinophils Absolute: 90 {cells}/uL (ref 15–500)
Eosinophils Relative: 1.4 %
HCT: 36.7 % (ref 35.0–45.0)
Hemoglobin: 12 g/dL (ref 11.7–15.5)
MCH: 30.4 pg (ref 27.0–33.0)
MCHC: 32.7 g/dL (ref 32.0–36.0)
MCV: 92.9 fL (ref 80.0–100.0)
MPV: 11.8 fL (ref 7.5–12.5)
Monocytes Relative: 8 %
Neutro Abs: 2515 {cells}/uL (ref 1500–7800)
Neutrophils Relative %: 39.3 %
Platelets: 241 Thousand/uL (ref 140–400)
RBC: 3.95 Million/uL (ref 3.80–5.10)
RDW: 14 % (ref 11.0–15.0)
Total Lymphocyte: 50.7 %
WBC: 6.4 Thousand/uL (ref 3.8–10.8)

## 2024-11-09 LAB — HIV-1 RNA QUANT-NO REFLEX-BLD
HIV 1 RNA Quant: NOT DETECTED {copies}/mL
HIV-1 RNA Quant, Log: NOT DETECTED {Log_copies}/mL

## 2024-11-09 LAB — AFP TUMOR MARKER: AFP-Tumor Marker: 3 ng/mL

## 2024-11-12 ENCOUNTER — Ambulatory Visit (INDEPENDENT_AMBULATORY_CARE_PROVIDER_SITE_OTHER): Payer: Self-pay | Admitting: Gastroenterology

## 2024-11-12 ENCOUNTER — Ambulatory Visit (HOSPITAL_COMMUNITY)
Admission: RE | Admit: 2024-11-12 | Discharge: 2024-11-12 | Disposition: A | Discharge status: Home / Self Care | Source: Ambulatory Visit | Attending: Gastroenterology | Admitting: Gastroenterology

## 2024-11-12 DIAGNOSIS — R932 Abnormal findings on diagnostic imaging of liver and biliary tract: Secondary | ICD-10-CM | POA: Diagnosis not present

## 2024-11-12 DIAGNOSIS — K7400 Hepatic fibrosis, unspecified: Secondary | ICD-10-CM | POA: Diagnosis not present

## 2024-11-13 ENCOUNTER — Ambulatory Visit: Payer: Self-pay | Admitting: Infectious Diseases

## 2024-11-16 DIAGNOSIS — M5416 Radiculopathy, lumbar region: Secondary | ICD-10-CM | POA: Diagnosis not present

## 2024-11-17 ENCOUNTER — Ambulatory Visit (INDEPENDENT_AMBULATORY_CARE_PROVIDER_SITE_OTHER): Admitting: Gastroenterology

## 2024-11-26 DIAGNOSIS — M5416 Radiculopathy, lumbar region: Secondary | ICD-10-CM | POA: Diagnosis not present

## 2024-12-02 ENCOUNTER — Other Ambulatory Visit: Payer: Self-pay

## 2024-12-02 ENCOUNTER — Other Ambulatory Visit (HOSPITAL_COMMUNITY): Payer: Self-pay

## 2024-12-02 ENCOUNTER — Other Ambulatory Visit: Payer: Self-pay | Admitting: Pharmacist

## 2024-12-02 DIAGNOSIS — Z21 Asymptomatic human immunodeficiency virus [HIV] infection status: Secondary | ICD-10-CM

## 2024-12-02 MED ORDER — CABOTEGRAVIR & RILPIVIRINE ER 600 & 900 MG/3ML IM SUER
1.0000 | INTRAMUSCULAR | 5 refills | Status: AC
  Filled 2024-12-02: qty 6, 60d supply, fill #0

## 2024-12-02 NOTE — Progress Notes (Signed)
 Specialty Pharmacy Refill Coordination Note  Laura Jones is a 53 y.o. female assessed today regarding refills of clinic administered specialty medication(s) Cabotegravir  & Rilpivirine  (CABENUVA )   Clinic requested Courier to Provider Office   Delivery date: 12/22/24   Verified address: [address] [place] [address]   Medication will be filled on 12/21/24.

## 2024-12-21 ENCOUNTER — Other Ambulatory Visit: Payer: Self-pay

## 2024-12-22 ENCOUNTER — Telehealth: Payer: Self-pay

## 2024-12-22 NOTE — Telephone Encounter (Signed)
 [facility] Patient Advocate Encounter  Patient's medications CABENUVA  have been couriered to [facility] from [facility] and will be administered at the patients appointment on 12/30/24.  Charmaine Sharps, CPhT Specialty Pharmacy Patient [facility] for Infectious Disease Phone: [phone] Fax:  [phone]

## 2024-12-30 ENCOUNTER — Other Ambulatory Visit: Payer: Self-pay

## 2024-12-30 ENCOUNTER — Ambulatory Visit (INDEPENDENT_AMBULATORY_CARE_PROVIDER_SITE_OTHER): Admitting: Pharmacist

## 2024-12-30 DIAGNOSIS — B20 Human immunodeficiency virus [HIV] disease: Secondary | ICD-10-CM

## 2024-12-30 DIAGNOSIS — Z21 Asymptomatic human immunodeficiency virus [HIV] infection status: Secondary | ICD-10-CM

## 2024-12-30 MED ORDER — CABOTEGRAVIR & RILPIVIRINE ER 600 & 900 MG/3ML IM SUER
1.0000 | Freq: Once | INTRAMUSCULAR | Status: AC
  Administered 2024-12-30: 1 via INTRAMUSCULAR

## 2024-12-30 NOTE — Progress Notes (Signed)
 "  HPI: Laura Jones is a 54 y.o. female who presents to the [facility] pharmacy clinic for Cabenuva  administration.  Referring ID Provider: Corean Fireman  Patient Active Problem List   Diagnosis Date Noted   Dysphagia 07/25/2023   Elevated serum creatinine 05/30/2020   Anemia 05/30/2020   Liver fibrosis 05/27/2019   Closed bimalleolar fracture of left ankle 02/03/2019   VIN III (vulvar intraepithelial neoplasia III) 10/14/2018   OA (osteoarthritis) of hip 06/18/2018   Hypercholesteremia 12/08/2017   History of total right hip replacement 09/18/2017   Hepatitis C virus infection cured after antiviral drug therapy 07/30/2017   Acid reflux 07/30/2017   HIV (human immunodeficiency virus infection) (HCC) 07/29/2017   Cystocele, lateral 07/25/2016   Avascular necrosis of bone of hip, left (HCC) 09/13/2015   Erythropoietic porphyria (HCC) 09/13/2015   Depression 07/01/2015    Patient's Medications  New Prescriptions   No medications on file  Previous Medications   ALBUTEROL (VENTOLIN HFA) 108 (90 BASE) MCG/ACT INHALER    SMARTSIG:1 Puff(s) Via Inhaler 4 Times Daily PRN   ATORVASTATIN  (LIPITOR) 10 MG TABLET    Take 10 mg by mouth at bedtime.   BUPROPION  (ZYBAN ) 150 MG 12 HR TABLET    Take 150 mg by mouth 2 (two) times daily.   BUSPIRONE  (BUSPAR ) 15 MG TABLET    Take 15 mg by mouth 2 (two) times daily.   CABOTEGRAVIR  & RILPIVIRINE  ER (CABENUVA ) 600 & 900 MG/3ML INJECTION    Inject 1 kit into the muscle every 2 (two) months.   CHOLECALCIFEROL (VITAMIN D) 50 MCG (2000 UT) TABLET    Take 4,000 Units by mouth daily.    DICYCLOMINE  (BENTYL ) 10 MG CAPSULE    Take 10 mg by mouth 2 (two) times daily.    DIPHENHYDRAMINE  (BENADRYL  ALLERGY) 25 MG CAPSULE    Take 25 mg by mouth every 6 (six) hours as needed.   DIPHENHYDRAMINE -ACETAMINOPHEN  (TYLENOL  PM) 25-500 MG TABS TABLET    Take 1 tablet by mouth daily as needed.   FAMOTIDINE  (PEPCID ) 40 MG TABLET    Take 40 mg by mouth at bedtime.   FLUOXETINE  (PROZAC) 40 MG CAPSULE    Take 40 mg by mouth daily.   IBANDRONATE (BONIVA) 150 MG TABLET    Take 150 mg by mouth every 30 (thirty) days.   LOPERAMIDE  (IMODIUM  A-D) 2 MG TABLET    Take 2 mg by mouth as needed for diarrhea or loose stools.   METOPROLOL TARTRATE (LOPRESSOR) 25 MG TABLET    Take 25 mg by mouth 2 (two) times daily.   OLMESARTAN (BENICAR) 20 MG TABLET    Take 20 mg by mouth daily.   OMEGA-3 FATTY ACIDS (FISH OIL) 1200 MG CAPS    Take by mouth. Takes one bid   OMEPRAZOLE  (PRILOSEC) 40 MG CAPSULE    TAKE 1 CAPSULE BY MOUTH TWICE A DAY   ONDANSETRON  (ZOFRAN -ODT) 4 MG DISINTEGRATING TABLET    Take 1 tablet (4 mg total) by mouth every 8 (eight) hours as needed for nausea or vomiting.   SUCRALFATE  (CARAFATE ) 1 GM/10ML SUSPENSION    TAKE 10 MLS (1 G TOTAL) BY MOUTH 4 (FOUR) TIMES DAILY - WITH MEALS AND AT BEDTIME.   TIZANIDINE (ZANAFLEX) 4 MG TABLET    Take 4 mg by mouth at bedtime. As needed   TRAZODONE  (DESYREL ) 100 MG TABLET    Take 100-200 mg by mouth at bedtime.  Modified Medications   No medications on file  Discontinued  Medications   No medications on file    Allergies: Allergies[1]  Labs: Lab Results  Component Value Date   HIV1RNAQUANT NOT DETECTED 11/05/2024   HIV1RNAQUANT NOT DETECTED 08/31/2024   HIV1RNAQUANT NOT DETECTED 04/29/2024   CD4TABS 980 11/05/2024   CD4TABS 663 04/29/2024   CD4TABS 578 11/05/2023    RPR and STI Lab Results  Component Value Date   LABRPR NON-REACTIVE 04/29/2024   LABRPR NON-REACTIVE 05/10/2020    STI Results GC CT  05/10/2020 11:08 AM Negative  Negative     Hepatitis B Lab Results  Component Value Date   HEPBSAB REACTIVE (A) 03/12/2023   HEPBSAG NON-REACTIVE 07/29/2017   Hepatitis C No results found for: HEPCAB, HCVRNAPCRQN Hepatitis A Lab Results  Component Value Date   HAV REACTIVE (A) 07/29/2017   Lipids: Lab Results  Component Value Date   CHOL 163 08/31/2024   TRIG 235 (H) 08/31/2024   HDL 48 (L)  08/31/2024   CHOLHDL 3.4 08/31/2024   VLDL 65 (H) 07/29/2017   LDLCALC 82 08/31/2024    Target Date: The 14th  Assessment: Laura Jones presents today for her maintenance Cabenuva  injections. Past injections were tolerated well without issues. Last HIV RNA was not detected in November. Doing well with no issues today.  Lab work:  None today  Eligible vaccinations:  Currently up to date on all recommended vaccines.    Cabenuva : Administered cabotegravir  600mg /91mL in left upper outer quadrant of the gluteal muscle. Administered rilpivirine  900 mg/3mL in the right upper outer quadrant of the gluteal muscle. No issues with injections. She will follow up in 2 months for next set of injections.  Plan: - Cabenuva  injections administered - Next injections scheduled for 03/02/25 with me - Call with any issues or questions  Nikolaus Pienta L. Arushi Partridge, PharmD, BCIDP, AAHIVP, CPP Clinical Pharmacist Practitioner - Infectious Diseases Clinical Pharmacist Lead - Specialty Pharmacy [facility] for Infectious Disease     [1]  Allergies Allergen Reactions   Levaquin [Levofloxacin] Rash    Difficulty breathing, mouth swelling/rash   Fish Oil Nausea And Vomiting and Nausea Only    Patient is taking fish oil now and no i  fish oils   Compazine [Prochlorperazine Edisylate]     Muscle twitching   Droperidol     uncontrolled muscle movements    Imitrex [Sumatriptan]     Difficulty breathing, chest pain; All triptans   Promethazine    Reglan [Metoclopramide]     Uncontrolled twitching   Sulfa Antibiotics Hives    Hives    Tagamet (Premixed [Cimetidine]    Thorazine [Chlorpromazine]     Uncontrolled muscle twitching   "

## 2025-01-07 ENCOUNTER — Encounter (INDEPENDENT_AMBULATORY_CARE_PROVIDER_SITE_OTHER): Payer: Self-pay | Admitting: Gastroenterology

## 2025-01-07 ENCOUNTER — Ambulatory Visit (INDEPENDENT_AMBULATORY_CARE_PROVIDER_SITE_OTHER): Admitting: Gastroenterology

## 2025-01-07 ENCOUNTER — Telehealth (INDEPENDENT_AMBULATORY_CARE_PROVIDER_SITE_OTHER): Payer: Self-pay

## 2025-01-07 VITALS — BP 86/55 | HR 68 | Temp 97.1°F | Ht 60.0 in | Wt 170.2 lb

## 2025-01-07 DIAGNOSIS — R1319 Other dysphagia: Secondary | ICD-10-CM

## 2025-01-07 DIAGNOSIS — K219 Gastro-esophageal reflux disease without esophagitis: Secondary | ICD-10-CM | POA: Diagnosis not present

## 2025-01-07 DIAGNOSIS — R1310 Dysphagia, unspecified: Secondary | ICD-10-CM | POA: Diagnosis not present

## 2025-01-07 NOTE — H&P (View-Only) (Signed)
 "  Referring Provider: Orpha Yancey LABOR, MD Primary Care Physician:  Orpha Yancey LABOR, MD Primary GI Physician: Previously Dr. Eartha (Dr. Cinderella)  Chief Complaint  Patient presents with   Follow-up    Pt arrives for follow up. Pt states she is still having issues with food going down. No pain. Last EGD 2024.  GERD is controlled at this time. Liquid that we prescribed seems to help. Does still belch at times.    HPI:   Laura Jones is a 54 y.o. female with past medical history of HIV, HCV status post treatment failure with combination of pegasys/RBV s/p treatment with SVR, advanced liver fibrosis secondary to HCV    Patient presenting today for:  Follow up of GERD, dysphagia  Last seen April 2025, at that time doing okay on omeprazole  40mg  BID, carafate  1g up to QID, some epigastric discomfort, sometimes belching, PCP started back on famotidine . Rare nausea but uses bentyl  PRN, taking bentyl  BID, has some constipation but not bad enough to warrant intervention   Recommended RUQ US , consider HCV fibrosure, continue omeprazole  40mg  BID, continue carafate , can stop famotidine   RUQ US  10/2024 Heterogeneous and coarsened liver parenchymal echogenicity with mildly lobular liver contours. Findings are nonspecific but can be seen in the setting of hepatocellular disease/early fibrosis. No focal lesion identified  AFP 10/2024 was 3  CMP with normal LFTs, creat 1.13 CBC with normal hgb of 12 plt count 241 k HIV RNA undetectable CD4 helper T cells 32 CD4 Cell abs 980  Present:  Having issues with dysphagia, almost everytime she eats. Felt that symptoms improved some after EGD but have slowly gotten worse over time. Usually can get food/pills to pass over time with drinking liquids. GERD is well managed, still having some belching but no heartburn. She is doing omeprazole  40mg  BID and famotidine  40mg  at bedtime. She uses carafate  as needed when she has some epigastric discomfort which seems  to help. She has some nausea at times, no vomiting. Uses zofran  as needed. Takes bentyl  BID which works well for her. No rectal bleeding, melena, diarrhea. She has constipation at times but is not taking anything as she feels it resolves on its own.  Does not take any NSAIDs.   History: Liver biopsy performed in 2003 that showed early cirrhosis per patient report. initial elastography showed F2/F3 fibrosis.  She has been following with the infectious disease  for her advanced liver fibrosis. currently following with the HIV clinic in [facility].  Reportedly had treatment in the past for hep C and achieved SVR in 2018.  Currently on treatment with Cabenuva .  Last viral load for HIV in November  was undetectable  Ultrasound liver elastography: 10/02/2023, median kPA 9.3, markedly limited but grossly unremarkable visualized portion of the liver Last Colonoscopy:2021 normal  Last Endoscopy:07/2023- No endoscopic esophageal abnormality to explain                            patient's dysphagia. Esophagus dilated. Biopsied                           - 1 cm hiatal hernia.                           - Gastroesophageal flap valve classified as Hill  Grade III (minimal fold, loose to endoscope, hiatal                            hernia likely).                           - Three gastric polyps-unremarkable squamous mucosa                            - Normal examined duodenum.   Recommendations:  Repeat colonoscopy 10 years   Filed Weights   01/07/25 1358  Weight: 170 lb 3.2 oz (77.2 kg)     Past Medical History:  Diagnosis Date   Anemia    hx of    Anxiety    Arthritis    left knee   Avascular necrosis of bone of hip, left (HCC)    Bulging lumbar disc    Depression    Dyspnea    with excertion   GERD (gastroesophageal reflux disease)    Hepatitis    C treated 2 years ago went undetected   History of kidney stones    HIV (human immunodeficiency virus infection) (HCC)  07/29/2017   HIV infection (HCC)    Insomnia    Migraines    Nerve pain    Perimenopausal    Porphyria (HCC)    PTSD (post-traumatic stress disorder)    Spinal stenosis of lumbosacral region    Tendonitis    right arm    Trauma     Past Surgical History:  Procedure Laterality Date   ABDOMINAL HYSTERECTOMY     ANKLE SURGERY Left 01/26/2019   BIOPSY  08/14/2023   Procedure: BIOPSY;  Surgeon: Eartha Angelia Sieving, MD;  Location: [facility];  Service: Gastroenterology;;   CESAREAN SECTION     ESOPHAGOGASTRODUODENOSCOPY     x2   ESOPHAGOGASTRODUODENOSCOPY (EGD) WITH PROPOFOL  N/A 08/14/2023   Procedure: ESOPHAGOGASTRODUODENOSCOPY (EGD) WITH PROPOFOL ;  Surgeon: Eartha Angelia Sieving, MD;  Location: [facility];  Service: Gastroenterology;  Laterality: N/A;  7:30am;asa 1   INCONTINENCE SURGERY     JOINT REPLACEMENT     left hip replacement Dr. Melodi 06-18-18   SAVORY DILATION  08/14/2023   Procedure: SAVORY DILATION;  Surgeon: Eartha Angelia Sieving, MD;  Location: [facility];  Service: Gastroenterology;;   TOTAL HIP ARTHROPLASTY Right 02/2013   TOTAL HIP ARTHROPLASTY Left 06/18/2018   Procedure: LEFT TOTAL HIP ARTHROPLASTY ANTERIOR APPROACH;  Surgeon: Melodi Lerner, MD;  Location: [facility];  Service: Orthopedics;  Laterality: Left;   TUBAL LIGATION     URETHRAL SLING     VULVECTOMY PARTIAL N/A 08/19/2019   Procedure: VULVECTOMY PARTIAL;  Surgeon: Jayne Vonn DEL, MD;  Location: [facility];  Service: Gynecology;  Laterality: N/A;    Current Outpatient Medications  Medication Sig Dispense Refill   atorvastatin  (LIPITOR) 10 MG tablet Take 10 mg by mouth at bedtime.  0   busPIRone  (BUSPAR ) 15 MG tablet Take 15 mg by mouth 2 (two) times daily.  0   cabotegravir  & rilpivirine  ER (CABENUVA ) 600 & 900 MG/3ML injection Inject 1 kit into the muscle every 2 (two) months. 6 mL 5   dicyclomine  (BENTYL ) 10 MG capsule Take 10 mg by mouth 2 (two) times daily.      diphenhydrAMINE   (BENADRYL  ALLERGY) 25 mg capsule Take 25 mg by mouth every 6 (six) hours  as needed.     diphenhydramine -acetaminophen  (TYLENOL  PM) 25-500 MG TABS tablet Take 1 tablet by mouth daily as needed.     famotidine  (PEPCID ) 40 MG tablet Take 40 mg by mouth at bedtime.     FLUoxetine (PROZAC) 40 MG capsule Take 40 mg by mouth daily.     ibandronate (BONIVA) 150 MG tablet Take 150 mg by mouth every 30 (thirty) days.     loperamide  (IMODIUM  A-D) 2 MG tablet Take 2 mg by mouth as needed for diarrhea or loose stools.     metoprolol tartrate (LOPRESSOR) 25 MG tablet Take 25 mg by mouth 2 (two) times daily.     olmesartan (BENICAR) 20 MG tablet Take 20 mg by mouth daily. (Patient taking differently: Take 40 mg by mouth daily.)     omeprazole  (PRILOSEC) 40 MG capsule TAKE 1 CAPSULE BY MOUTH TWICE A DAY 180 capsule 1   ondansetron  (ZOFRAN -ODT) 4 MG disintegrating tablet Take 1 tablet (4 mg total) by mouth every 8 (eight) hours as needed for nausea or vomiting. 30 tablet 2   sucralfate  (CARAFATE ) 1 GM/10ML suspension TAKE 10 MLS (1 G TOTAL) BY MOUTH 4 (FOUR) TIMES DAILY - WITH MEALS AND AT BEDTIME. 420 mL 1   tiZANidine (ZANAFLEX) 4 MG tablet Take 4 mg by mouth at bedtime. As needed     traZODone  (DESYREL ) 100 MG tablet Take 100-200 mg by mouth at bedtime.     albuterol (VENTOLIN HFA) 108 (90 Base) MCG/ACT inhaler SMARTSIG:1 Puff(s) Via Inhaler 4 Times Daily PRN (Patient not taking: Reported on 01/07/2025)     buPROPion  (ZYBAN ) 150 MG 12 hr tablet Take 150 mg by mouth 2 (two) times daily. (Patient not taking: Reported on 01/07/2025)     Cholecalciferol (VITAMIN D) 50 MCG (2000 UT) tablet Take 4,000 Units by mouth daily.  (Patient not taking: Reported on 01/07/2025)     Omega-3 Fatty Acids (FISH OIL) 1200 MG CAPS Take by mouth. Takes one bid (Patient not taking: Reported on 01/07/2025)     No current facility-administered medications for this visit.    Allergies as of 01/07/2025 - Review Complete 01/07/2025   Allergen Reaction Noted   Levaquin [levofloxacin] Rash 07/29/2017   Fish oil Nausea And Vomiting and Nausea Only 06/16/2018   Compazine [prochlorperazine edisylate]  07/29/2017   Droperidol  07/29/2017   Imitrex [sumatriptan]  07/29/2017   Promethazine  09/01/2022   Reglan [metoclopramide]  07/29/2017   Sulfa antibiotics Hives 07/29/2017   Tagamet (premixed [cimetidine]  08/15/2021   Thorazine [chlorpromazine]  07/29/2017    Social History   Socioeconomic History   Marital status: Divorced    Spouse name: Not on file   Number of children: Not on file   Years of education: Not on file   Highest education level: Not on file  Occupational History   Not on file  Tobacco Use   Smoking status: Never    Passive exposure: Past   Smokeless tobacco: Never  Vaping Use   Vaping status: Never Used  Substance and Sexual Activity   Alcohol use: No   Drug use: No   Sexual activity: Not Currently    Birth control/protection: Surgical    Comment: declined condoms (hyst)  Other Topics Concern   Not on file  Social History Narrative   Not on file   Social Drivers of Health   Tobacco Use: Low Risk (01/07/2025)   Patient History    Smoking Tobacco Use: Never    Smokeless Tobacco  Use: Never    Passive Exposure: Past  Financial Resource Strain: Not on file  Food Insecurity: Not on file  Transportation Needs: Not on file  Physical Activity: Not on file  Stress: Not on file  Social Connections: Unknown (04/23/2022)   Received from [facility]   Social Network    Social Network: Not on file  Depression (PHQ2-9): Medium Risk (04/29/2024)   Depression (PHQ2-9)    PHQ-2 Score: 8  Alcohol Screen: Not on file  Housing: Not on file  Utilities: Not on file  Health Literacy: Not on file    Review of systems General: negative for malaise, night sweats, fever, chills, weight loss Neck: Negative for lumps, goiter, pain and significant neck swelling Resp: Negative for cough, wheezing,  dyspnea at rest CV: Negative for chest pain, leg swelling, palpitations, orthopnea GI: denies melena, hematochezia, nausea, vomiting, diarrhea, odyonophagia, early satiety or unintentional weight loss. +dysphagia +occasional epigastric pain +constipation MSK: Negative for joint pain or swelling, back pain, and muscle pain. Derm: Negative for itching or rash Psych: Denies depression, anxiety, memory loss, confusion. No homicidal or suicidal ideation.  Heme: Negative for prolonged bleeding, bruising easily, and swollen nodes. Endocrine: Negative for cold or heat intolerance, polyuria, polydipsia and goiter. Neuro: negative for tremor, gait imbalance, syncope and seizures. The remainder of the review of systems is noncontributory.  Physical Exam: BP (!) 86/55   Pulse 68   Temp (!) 97.1 F (36.2 C)   Ht 5' (1.524 m)   Wt 170 lb 3.2 oz (77.2 kg)   LMP 12/25/2003   BMI 33.24 kg/m  General:   Alert and oriented. No distress noted. Pleasant and cooperative.  Head:  Normocephalic and atraumatic. Eyes:  Conjuctiva clear without scleral icterus. Mouth:  Oral mucosa pink and moist. Good dentition. No lesions. Heart: Normal rate and rhythm, s1 and s2 heart sounds present.  Lungs: Clear lung sounds in all lobes. Respirations equal and unlabored. Abdomen:  +BS, soft, non-tender and non-distended. No rebound or guarding. No HSM or masses noted. Derm: No palmar erythema or jaundice Msk:  Symmetrical without gross deformities. Normal posture. Extremities:  Without edema. Neurologic:  Alert and  oriented x4 Psych:  Alert and cooperative. Normal mood and affect.  Invalid input(s): 6 MONTHS   ASSESSMENT: Laura Jones is a 54 y.o. female presenting today for follow up of GERD, dysphagia  GERD well managed with omeprazole  40mg  BID and famotidine  40mg  at bedtime. She takes carafate  as needed for epigastric discomfort which seems to control her symptoms. She does report more dysphagia recent with  foods and pills. Last EGD in 2024 with no esophageal abnormality but esophagus was stretched and she had some improvement thereafter. She denies any odynophagia, no NSAID use. Recommend proceeding with EGD for further evaluation as I cannot rule out esophageal ring, web, stricture, stenosis or esophagitis. Indications, risks and benefits of procedure discussed in detail with patient. Patient verbalized understanding and is in agreement to proceed with EGD +/- dilation.  Given history of HCV and elevated kpa on liver elastography indicating possible advanced fibrosis, patient needs to continue with routine HCC screening every 6 months via US  and AFP tumor marker, consider checking HCV fibrosure at follow up as well for further deliniation of fibrosis score. Her most recent LFTs and platelet counts were WNL.  Notably BP was low in office today. Patient denies any lightheadedness, dizziness or other associated symptoms. States recent BP meds were increased. She does check this at home. BP remained  low on repeat in the office. I recommended she continue to monitor at home and reach out to PCP to let them know of hypotension in our office today.    PLAN:  -repeat RUQ US  may with CMP, AFP, CBC (will schedule at follow up) -consider HCV fibrosure at follow up -continue omeprazole  40mg  BID and famotidine  40mg  QHS -continue carafate  1g PRN -schedule EGD +/- dilation ASA II  -discuss hypotension with PCP   All questions were answered, patient verbalized understanding and is in agreement with plan as outlined above.    Follow Up: 4 months   Shanara Schnieders L. Latara Micheli, MSN, APRN, AGNP-C Adult-Gerontology Nurse Practitioner [facility]  "

## 2025-01-07 NOTE — Progress Notes (Addendum)
 "  Referring Provider: Orpha Yancey LABOR, MD Primary Care Physician:  Orpha Yancey LABOR, MD Primary GI Physician: Previously Dr. Eartha (Dr. Cinderella)  Chief Complaint  Patient presents with   Follow-up    Pt arrives for follow up. Pt states she is still having issues with food going down. No pain. Last EGD 2024.  GERD is controlled at this time. Liquid that we prescribed seems to help. Does still belch at times.    HPI:   Laura Jones is a 54 y.o. female with past medical history of HIV, HCV status post treatment failure with combination of pegasys/RBV s/p treatment with SVR, advanced liver fibrosis secondary to HCV    Patient presenting today for:  Follow up of GERD, dysphagia  Last seen April 2025, at that time doing okay on omeprazole  40mg  BID, carafate  1g up to QID, some epigastric discomfort, sometimes belching, PCP started back on famotidine . Rare nausea but uses bentyl  PRN, taking bentyl  BID, has some constipation but not bad enough to warrant intervention   Recommended RUQ US , consider HCV fibrosure, continue omeprazole  40mg  BID, continue carafate , can stop famotidine   RUQ US  10/2024 Heterogeneous and coarsened liver parenchymal echogenicity with mildly lobular liver contours. Findings are nonspecific but can be seen in the setting of hepatocellular disease/early fibrosis. No focal lesion identified  AFP 10/2024 was 3  CMP with normal LFTs, creat 1.13 CBC with normal hgb of 12 plt count 241 k HIV RNA undetectable CD4 helper T cells 32 CD4 Cell abs 980  Present:  Having issues with dysphagia, almost everytime she eats. Felt that symptoms improved some after EGD but have slowly gotten worse over time. Usually can get food/pills to pass over time with drinking liquids. GERD is well managed, still having some belching but no heartburn. She is doing omeprazole  40mg  BID and famotidine  40mg  at bedtime. She uses carafate  as needed when she has some epigastric discomfort which seems  to help. She has some nausea at times, no vomiting. Uses zofran  as needed. Takes bentyl  BID which works well for her. No rectal bleeding, melena, diarrhea. She has constipation at times but is not taking anything as she feels it resolves on its own.  Does not take any NSAIDs.   History: Liver biopsy performed in 2003 that showed early cirrhosis per patient report. initial elastography showed F2/F3 fibrosis.  She has been following with the infectious disease  for her advanced liver fibrosis. currently following with the HIV clinic in [facility].  Reportedly had treatment in the past for hep C and achieved SVR in 2018.  Currently on treatment with Cabenuva .  Last viral load for HIV in November  was undetectable  Ultrasound liver elastography: 10/02/2023, median kPA 9.3, markedly limited but grossly unremarkable visualized portion of the liver Last Colonoscopy:2021 normal  Last Endoscopy:07/2023- No endoscopic esophageal abnormality to explain                            patient's dysphagia. Esophagus dilated. Biopsied                           - 1 cm hiatal hernia.                           - Gastroesophageal flap valve classified as Hill  Grade III (minimal fold, loose to endoscope, hiatal                            hernia likely).                           - Three gastric polyps-unremarkable squamous mucosa                            - Normal examined duodenum.   Recommendations:  Repeat colonoscopy 10 years   Filed Weights   01/07/25 1358  Weight: 170 lb 3.2 oz (77.2 kg)     Past Medical History:  Diagnosis Date   Anemia    hx of    Anxiety    Arthritis    left knee   Avascular necrosis of bone of hip, left (HCC)    Bulging lumbar disc    Depression    Dyspnea    with excertion   GERD (gastroesophageal reflux disease)    Hepatitis    C treated 2 years ago went undetected   History of kidney stones    HIV (human immunodeficiency virus infection) (HCC)  07/29/2017   HIV infection (HCC)    Insomnia    Migraines    Nerve pain    Perimenopausal    Porphyria (HCC)    PTSD (post-traumatic stress disorder)    Spinal stenosis of lumbosacral region    Tendonitis    right arm    Trauma     Past Surgical History:  Procedure Laterality Date   ABDOMINAL HYSTERECTOMY     ANKLE SURGERY Left 01/26/2019   BIOPSY  08/14/2023   Procedure: BIOPSY;  Surgeon: Eartha Angelia Sieving, MD;  Location: [facility];  Service: Gastroenterology;;   CESAREAN SECTION     ESOPHAGOGASTRODUODENOSCOPY     x2   ESOPHAGOGASTRODUODENOSCOPY (EGD) WITH PROPOFOL  N/A 08/14/2023   Procedure: ESOPHAGOGASTRODUODENOSCOPY (EGD) WITH PROPOFOL ;  Surgeon: Eartha Angelia Sieving, MD;  Location: [facility];  Service: Gastroenterology;  Laterality: N/A;  7:30am;asa 1   INCONTINENCE SURGERY     JOINT REPLACEMENT     left hip replacement Dr. Melodi 06-18-18   SAVORY DILATION  08/14/2023   Procedure: SAVORY DILATION;  Surgeon: Eartha Angelia Sieving, MD;  Location: [facility];  Service: Gastroenterology;;   TOTAL HIP ARTHROPLASTY Right 02/2013   TOTAL HIP ARTHROPLASTY Left 06/18/2018   Procedure: LEFT TOTAL HIP ARTHROPLASTY ANTERIOR APPROACH;  Surgeon: Melodi Lerner, MD;  Location: [facility];  Service: Orthopedics;  Laterality: Left;   TUBAL LIGATION     URETHRAL SLING     VULVECTOMY PARTIAL N/A 08/19/2019   Procedure: VULVECTOMY PARTIAL;  Surgeon: Jayne Vonn DEL, MD;  Location: [facility];  Service: Gynecology;  Laterality: N/A;    Current Outpatient Medications  Medication Sig Dispense Refill   atorvastatin  (LIPITOR) 10 MG tablet Take 10 mg by mouth at bedtime.  0   busPIRone  (BUSPAR ) 15 MG tablet Take 15 mg by mouth 2 (two) times daily.  0   cabotegravir  & rilpivirine  ER (CABENUVA ) 600 & 900 MG/3ML injection Inject 1 kit into the muscle every 2 (two) months. 6 mL 5   dicyclomine  (BENTYL ) 10 MG capsule Take 10 mg by mouth 2 (two) times daily.      diphenhydrAMINE   (BENADRYL  ALLERGY) 25 mg capsule Take 25 mg by mouth every 6 (six) hours  as needed.     diphenhydramine -acetaminophen  (TYLENOL  PM) 25-500 MG TABS tablet Take 1 tablet by mouth daily as needed.     famotidine  (PEPCID ) 40 MG tablet Take 40 mg by mouth at bedtime.     FLUoxetine (PROZAC) 40 MG capsule Take 40 mg by mouth daily.     ibandronate (BONIVA) 150 MG tablet Take 150 mg by mouth every 30 (thirty) days.     loperamide  (IMODIUM  A-D) 2 MG tablet Take 2 mg by mouth as needed for diarrhea or loose stools.     metoprolol tartrate (LOPRESSOR) 25 MG tablet Take 25 mg by mouth 2 (two) times daily.     olmesartan (BENICAR) 20 MG tablet Take 20 mg by mouth daily. (Patient taking differently: Take 40 mg by mouth daily.)     omeprazole  (PRILOSEC) 40 MG capsule TAKE 1 CAPSULE BY MOUTH TWICE A DAY 180 capsule 1   ondansetron  (ZOFRAN -ODT) 4 MG disintegrating tablet Take 1 tablet (4 mg total) by mouth every 8 (eight) hours as needed for nausea or vomiting. 30 tablet 2   sucralfate  (CARAFATE ) 1 GM/10ML suspension TAKE 10 MLS (1 G TOTAL) BY MOUTH 4 (FOUR) TIMES DAILY - WITH MEALS AND AT BEDTIME. 420 mL 1   tiZANidine (ZANAFLEX) 4 MG tablet Take 4 mg by mouth at bedtime. As needed     traZODone  (DESYREL ) 100 MG tablet Take 100-200 mg by mouth at bedtime.     albuterol (VENTOLIN HFA) 108 (90 Base) MCG/ACT inhaler SMARTSIG:1 Puff(s) Via Inhaler 4 Times Daily PRN (Patient not taking: Reported on 01/07/2025)     buPROPion  (ZYBAN ) 150 MG 12 hr tablet Take 150 mg by mouth 2 (two) times daily. (Patient not taking: Reported on 01/07/2025)     Cholecalciferol (VITAMIN D) 50 MCG (2000 UT) tablet Take 4,000 Units by mouth daily.  (Patient not taking: Reported on 01/07/2025)     Omega-3 Fatty Acids (FISH OIL) 1200 MG CAPS Take by mouth. Takes one bid (Patient not taking: Reported on 01/07/2025)     No current facility-administered medications for this visit.    Allergies as of 01/07/2025 - Review Complete 01/07/2025   Allergen Reaction Noted   Levaquin [levofloxacin] Rash 07/29/2017   Fish oil Nausea And Vomiting and Nausea Only 06/16/2018   Compazine [prochlorperazine edisylate]  07/29/2017   Droperidol  07/29/2017   Imitrex [sumatriptan]  07/29/2017   Promethazine  09/01/2022   Reglan [metoclopramide]  07/29/2017   Sulfa antibiotics Hives 07/29/2017   Tagamet (premixed [cimetidine]  08/15/2021   Thorazine [chlorpromazine]  07/29/2017    Social History   Socioeconomic History   Marital status: Divorced    Spouse name: Not on file   Number of children: Not on file   Years of education: Not on file   Highest education level: Not on file  Occupational History   Not on file  Tobacco Use   Smoking status: Never    Passive exposure: Past   Smokeless tobacco: Never  Vaping Use   Vaping status: Never Used  Substance and Sexual Activity   Alcohol use: No   Drug use: No   Sexual activity: Not Currently    Birth control/protection: Surgical    Comment: declined condoms (hyst)  Other Topics Concern   Not on file  Social History Narrative   Not on file   Social Drivers of Health   Tobacco Use: Low Risk (01/07/2025)   Patient History    Smoking Tobacco Use: Never    Smokeless Tobacco  Use: Never    Passive Exposure: Past  Financial Resource Strain: Not on file  Food Insecurity: Not on file  Transportation Needs: Not on file  Physical Activity: Not on file  Stress: Not on file  Social Connections: Unknown (04/23/2022)   Received from [facility]   Social Network    Social Network: Not on file  Depression (PHQ2-9): Medium Risk (04/29/2024)   Depression (PHQ2-9)    PHQ-2 Score: 8  Alcohol Screen: Not on file  Housing: Not on file  Utilities: Not on file  Health Literacy: Not on file    Review of systems General: negative for malaise, night sweats, fever, chills, weight loss Neck: Negative for lumps, goiter, pain and significant neck swelling Resp: Negative for cough, wheezing,  dyspnea at rest CV: Negative for chest pain, leg swelling, palpitations, orthopnea GI: denies melena, hematochezia, nausea, vomiting, diarrhea, odyonophagia, early satiety or unintentional weight loss. +dysphagia +occasional epigastric pain +constipation MSK: Negative for joint pain or swelling, back pain, and muscle pain. Derm: Negative for itching or rash Psych: Denies depression, anxiety, memory loss, confusion. No homicidal or suicidal ideation.  Heme: Negative for prolonged bleeding, bruising easily, and swollen nodes. Endocrine: Negative for cold or heat intolerance, polyuria, polydipsia and goiter. Neuro: negative for tremor, gait imbalance, syncope and seizures. The remainder of the review of systems is noncontributory.  Physical Exam: BP (!) 86/55   Pulse 68   Temp (!) 97.1 F (36.2 C)   Ht 5' (1.524 m)   Wt 170 lb 3.2 oz (77.2 kg)   LMP 12/25/2003   BMI 33.24 kg/m  General:   Alert and oriented. No distress noted. Pleasant and cooperative.  Head:  Normocephalic and atraumatic. Eyes:  Conjuctiva clear without scleral icterus. Mouth:  Oral mucosa pink and moist. Good dentition. No lesions. Heart: Normal rate and rhythm, s1 and s2 heart sounds present.  Lungs: Clear lung sounds in all lobes. Respirations equal and unlabored. Abdomen:  +BS, soft, non-tender and non-distended. No rebound or guarding. No HSM or masses noted. Derm: No palmar erythema or jaundice Msk:  Symmetrical without gross deformities. Normal posture. Extremities:  Without edema. Neurologic:  Alert and  oriented x4 Psych:  Alert and cooperative. Normal mood and affect.  Invalid input(s): 6 MONTHS   ASSESSMENT: Laura Jones is a 54 y.o. female presenting today for follow up of GERD, dysphagia  GERD well managed with omeprazole  40mg  BID and famotidine  40mg  at bedtime. She takes carafate  as needed for epigastric discomfort which seems to control her symptoms. She does report more dysphagia recent with  foods and pills. Last EGD in 2024 with no esophageal abnormality but esophagus was stretched and she had some improvement thereafter. She denies any odynophagia, no NSAID use. Recommend proceeding with EGD for further evaluation as I cannot rule out esophageal ring, web, stricture, stenosis or esophagitis. Indications, risks and benefits of procedure discussed in detail with patient. Patient verbalized understanding and is in agreement to proceed with EGD +/- dilation.  Given history of HCV and elevated kpa on liver elastography indicating possible advanced fibrosis, patient needs to continue with routine HCC screening every 6 months via US  and AFP tumor marker, consider checking HCV fibrosure at follow up as well for further deliniation of fibrosis score. Her most recent LFTs and platelet counts were WNL.  Notably BP was low in office today. Patient denies any lightheadedness, dizziness or other associated symptoms. States recent BP meds were increased. She does check this at home. BP remained  low on repeat in the office. I recommended she continue to monitor at home and reach out to PCP to let them know of hypotension in our office today.    PLAN:  -repeat RUQ US  may with CMP, AFP, CBC (will schedule at follow up) -consider HCV fibrosure at follow up -continue omeprazole  40mg  BID and famotidine  40mg  QHS -continue carafate  1g PRN -schedule EGD +/- dilation ASA II  -discuss hypotension with PCP   All questions were answered, patient verbalized understanding and is in agreement with plan as outlined above.    Follow Up: 4 months   Shanara Schnieders L. Latara Micheli, MSN, APRN, AGNP-C Adult-Gerontology Nurse Practitioner [facility]  "

## 2025-01-07 NOTE — Patient Instructions (Signed)
-  repeat US  of the liver in May, can schedule this at your follow up  -continue omeprazole  40mg  twice daily -continue bentyl  twice daily  -continue carafate  1g as needed for abdominal discomfort  -schedule EGD for swallowing issues  Follow up 4 months  It was a pleasure to see you today. I want to create trusting relationships with patients and provide genuine, compassionate, and quality care. I truly value your feedback! please be on the lookout for a survey regarding your visit with me today. I appreciate your input about our visit and your time in completing this!    Laura Jones L. Bailey Faiella, MSN, APRN, AGNP-C Adult-Gerontology Nurse Practitioner [facility] at [facility]

## 2025-01-07 NOTE — Telephone Encounter (Signed)
 Spoke with patient in the office, scheduled EGD/DIL for 01/26/2025 at 2:15pm. Instructions given to patient in person.

## 2025-01-07 NOTE — Telephone Encounter (Signed)
PA is not required.

## 2025-01-22 NOTE — Telephone Encounter (Signed)
 Spoke with patient, moved patient up to 8:30am. Sending message to endo

## 2025-01-26 ENCOUNTER — Other Ambulatory Visit: Payer: Self-pay

## 2025-01-26 ENCOUNTER — Encounter (HOSPITAL_COMMUNITY): Admitting: Anesthesiology

## 2025-01-26 ENCOUNTER — Encounter (HOSPITAL_COMMUNITY)
Admission: RE | Disposition: A | Discharge status: Home / Self Care | Payer: Self-pay | Source: Home / Self Care | Attending: Gastroenterology

## 2025-01-26 ENCOUNTER — Encounter (HOSPITAL_COMMUNITY): Payer: Self-pay | Admitting: Gastroenterology

## 2025-01-26 ENCOUNTER — Ambulatory Visit (HOSPITAL_COMMUNITY)
Admission: RE | Admit: 2025-01-26 | Discharge: 2025-01-26 | Disposition: A | Attending: Gastroenterology | Admitting: Gastroenterology

## 2025-01-26 DIAGNOSIS — K2970 Gastritis, unspecified, without bleeding: Secondary | ICD-10-CM

## 2025-01-26 DIAGNOSIS — R1310 Dysphagia, unspecified: Secondary | ICD-10-CM | POA: Insufficient documentation

## 2025-01-26 DIAGNOSIS — K2950 Unspecified chronic gastritis without bleeding: Secondary | ICD-10-CM | POA: Insufficient documentation

## 2025-01-26 DIAGNOSIS — K317 Polyp of stomach and duodenum: Secondary | ICD-10-CM

## 2025-01-26 DIAGNOSIS — K219 Gastro-esophageal reflux disease without esophagitis: Secondary | ICD-10-CM | POA: Insufficient documentation

## 2025-01-26 DIAGNOSIS — Z21 Asymptomatic human immunodeficiency virus [HIV] infection status: Secondary | ICD-10-CM | POA: Insufficient documentation

## 2025-01-26 MED ORDER — PROPOFOL 10 MG/ML IV BOLUS
INTRAVENOUS | Status: DC | PRN
  Administered 2025-01-26: 200 ug/kg/min via INTRAVENOUS
  Administered 2025-01-26: 30 mg via INTRAVENOUS

## 2025-01-26 MED ORDER — LIDOCAINE 2% (20 MG/ML) 5 ML SYRINGE
INTRAMUSCULAR | Status: DC | PRN
  Administered 2025-01-26: 100 mg via INTRAVENOUS

## 2025-01-26 MED ORDER — LACTATED RINGERS IV SOLN: INTRAVENOUS | Status: DC

## 2025-01-26 NOTE — Interval H&P Note (Signed)
 History and Physical Interval Note:  01/26/2025 7:41 AM  Laura Jones  has presented today for surgery, with the diagnosis of dysphagia.  The various methods of treatment have been discussed with the patient and family. After consideration of risks, benefits and other options for treatment, the patient has consented to  Procedures with comments: EGD (ESOPHAGOGASTRODUODENOSCOPY) (N/A) - 2:15pm, ASA 2 DILATION, ESOPHAGUS (N/A) as a surgical intervention.  The patient's history has been reviewed, patient examined, no change in status, stable for surgery.  I have reviewed the patient's chart and labs.  Questions were answered to the patient's satisfaction.     Deatrice FALCON Layken Beg

## 2025-01-26 NOTE — Op Note (Signed)
 [facility] Patient Name: Laura Jones Procedure Date: 01/26/2025 8:02 AM MRN: [id number] Date of Birth: 05-30-1971 Attending MD: Deatrice Dine , MD, [id number] CSN: [id number] Age: 54 Admit Type: Outpatient Procedure:                Upper GI endoscopy Indications:              Dysphagia Providers:                Deatrice Dine, MD, Rosina Sprague, Chad Wilson,                            Technician Referring MD:              Medicines:                Monitored Anesthesia Care Complications:            No immediate complications. Estimated Blood Loss:     Estimated blood loss was minimal. Procedure:                Pre-Anesthesia Assessment:                           - Prior to the procedure, a History and Physical                            was performed, and patient medications and                            allergies were reviewed. The patient's tolerance of                            previous anesthesia was also reviewed. The risks                            and benefits of the procedure and the sedation                            options and risks were discussed with the patient.                            All questions were answered, and informed consent                            was obtained. Prior Anticoagulants: The patient has                            taken no anticoagulant or antiplatelet agents                            except for aspirin . ASA Grade Assessment: II - A                            patient with mild systemic disease. After reviewing  the risks and benefits, the patient was deemed in                            satisfactory condition to undergo the procedure.                           After obtaining informed consent, the endoscope was                            passed under direct vision. Throughout the                            procedure, the patient's blood pressure, pulse, and                            oxygen saturations  were monitored continuously. The                            [id number]-[id number] ([id number]) Upper was introduced through                            the mouth, and advanced to the second part of                            duodenum. The upper GI endoscopy was accomplished                            without difficulty. The patient tolerated the                            procedure well. Scope In: 8:15:24 AM Scope Out: 8:28:29 AM Total Procedure Duration: 0 hours 13 minutes 5 seconds  Findings:      No endoscopic abnormality was evident in the esophagus to explain the       patient's complaint of dysphagia. It was decided, however, to proceed       with dilation of the entire esophagus. A TTS dilator was passed through       the scope. Dilation with a 15-16.5-18 mm balloon dilator was performed       to 18 mm. The dilation site was examined following endoscope reinsertion       and showed mild mucosal disruption. Biopsies were obtained from the       proximal and distal esophagus with cold forceps for histology of       suspected eosinophilic esophagitis.      Four 4 to 7 mm sessile polyps with no bleeding and no stigmata of recent       bleeding were found in the gastric antrum. The polyp was removed with a       cold snare. Resection and retrieval were complete.      Mild inflammation characterized by erythema was found in the gastric       antrum. Biopsies were taken with a cold forceps for histology. For       hemostasis, one hemostatic clip was successfully placed (MR       conditional). Clip manufacturer: [employer]. There was no  bleeding at the end of the procedure.      The duodenal bulb and second portion of the duodenum were normal. Impression:               - No endoscopic esophageal abnormality to explain                            patient's dysphagia. Esophagus dilated. Dilated.                           - Four gastric polyps. 3 of them Resected and                             retrieved.                           - Gastritis. Biopsied. Clip (MR conditional) was                            placed. Clip manufacturer: [employer].                           - Normal duodenal bulb and second portion of the                            duodenum.                           - Biopsies were taken with a cold forceps for                            evaluation of eosinophilic esophagitis. Moderate Sedation:      Per Anesthesia Care Recommendation:           - Patient has a contact number available for                            emergencies. The signs and symptoms of potential                            delayed complications were discussed with the                            patient. Return to normal activities tomorrow.                            Written discharge instructions were provided to the                            patient.                           - Resume previous diet.                           - Continue present medications.                           -  Await pathology results.                           - Repeat upper endoscopy PRN. Procedure Code(s):        --- Professional ---                           [phone], Esophagogastroduodenoscopy, flexible,                            transoral; with removal of tumor(s), polyp(s), or                            other lesion(s) by snare technique                           43249, Esophagogastroduodenoscopy, flexible,                            transoral; with transendoscopic balloon dilation of                            esophagus (less than 30 mm diameter)                           43239, 59, Esophagogastroduodenoscopy, flexible,                            transoral; with biopsy, single or multiple Diagnosis Code(s):        --- Professional ---                           R13.10, Dysphagia, unspecified                           K31.7, Polyp of stomach and duodenum                           K29.70, Gastritis,  unspecified, without bleeding CPT copyright 2022 American Medical Association. All rights reserved. The codes documented in this report are preliminary and upon coder review may  be revised to meet current compliance requirements. Deatrice Dine, MD Deatrice Dine, MD 01/26/2025 8:37:20 AM This report has been signed electronically. Number of Addenda: 0

## 2025-01-26 NOTE — Anesthesia Preprocedure Evaluation (Signed)
"                                    Anesthesia Evaluation  Patient identified by MRN, date of birth, ID band Patient awake    Reviewed: Allergy & Precautions, H&P , NPO status , Patient's Chart, lab work & pertinent test results, reviewed documented beta blocker date and time   Airway Mallampati: II  TM Distance: >3 FB Neck ROM: full    Dental no notable dental hx.    Pulmonary shortness of breath   Pulmonary exam normal breath sounds clear to auscultation       Cardiovascular Exercise Tolerance: Good hypertension, negative cardio ROS  Rhythm:regular Rate:Normal     Neuro/Psych  Headaches PSYCHIATRIC DISORDERS Anxiety Depression       GI/Hepatic ,GERD  ,,(+) Hepatitis -  Endo/Other  negative endocrine ROS    Renal/GU negative Renal ROS  negative genitourinary   Musculoskeletal   Abdominal   Peds  Hematology  (+) Blood dyscrasia, anemia , HIV  Anesthesia Other Findings   Reproductive/Obstetrics negative OB ROS                              Anesthesia Physical Anesthesia Plan  ASA: 3  Anesthesia Plan: MAC   Post-op Pain Management:    Induction:   PONV Risk Score and Plan: Propofol  infusion  Airway Management Planned:   Additional Equipment:   Intra-op Plan:   Post-operative Plan:   Informed Consent: I have reviewed the patients History and Physical, chart, labs and discussed the procedure including the risks, benefits and alternatives for the proposed anesthesia with the patient or authorized representative who has indicated his/her understanding and acceptance.     Dental Advisory Given  Plan Discussed with: CRNA  Anesthesia Plan Comments:         Anesthesia Quick Evaluation  "

## 2025-01-26 NOTE — Discharge Instructions (Signed)

## 2025-01-26 NOTE — Transfer of Care (Signed)
 Immediate Anesthesia Transfer of Care Note  Patient: Laura Jones  Procedure(s) Performed: EGD (ESOPHAGOGASTRODUODENOSCOPY) DILATION, ESOPHAGUS POLYPECTOMY, INTESTINE CONTROL OF HEMORRHAGE, GI TRACT, ENDOSCOPIC, BY CLIPPING OR OVERSEWING  Patient Location: PACU  Anesthesia Type:MAC  Level of Consciousness: awake and alert   Airway & Oxygen Therapy: Patient Spontanous Breathing and Patient connected to nasal cannula oxygen  Post-op Assessment: Report given to RN, Post -op Vital signs reviewed and stable, and Patient moving all extremities X 4  Post vital signs: Reviewed  Last Vitals:  Vitals Value Taken Time  BP 125/86   Temp 37   Pulse 74   Resp 16   SpO2 98     Last Pain:  Vitals:   01/26/25 0809  TempSrc:   PainSc: 3       Patients Stated Pain Goal: 8 (01/26/25 0721)  Complications: There were no known notable events for this encounter.

## 2025-01-28 LAB — SURGICAL PATHOLOGY

## 2025-03-02 ENCOUNTER — Ambulatory Visit: Payer: Self-pay | Admitting: Pharmacist

## 2025-05-10 ENCOUNTER — Ambulatory Visit (INDEPENDENT_AMBULATORY_CARE_PROVIDER_SITE_OTHER): Admitting: Gastroenterology
